# Patient Record
Sex: Female | Born: 1960
Health system: Southern US, Community
[De-identification: ages and names within clinical notes are randomized; demographics above are authoritative.]

## PROBLEM LIST (undated history)

## (undated) DIAGNOSIS — I10 Essential (primary) hypertension: Secondary | ICD-10-CM

## (undated) DIAGNOSIS — K56609 Unspecified intestinal obstruction, unspecified as to partial versus complete obstruction: Secondary | ICD-10-CM

## (undated) DIAGNOSIS — E119 Type 2 diabetes mellitus without complications: Secondary | ICD-10-CM

## (undated) DIAGNOSIS — E7800 Pure hypercholesterolemia, unspecified: Secondary | ICD-10-CM

## (undated) DIAGNOSIS — J40 Bronchitis, not specified as acute or chronic: Secondary | ICD-10-CM

## (undated) DIAGNOSIS — I428 Other cardiomyopathies: Secondary | ICD-10-CM

## (undated) DIAGNOSIS — I519 Heart disease, unspecified: Secondary | ICD-10-CM

## (undated) DIAGNOSIS — M1990 Unspecified osteoarthritis, unspecified site: Secondary | ICD-10-CM

## (undated) DIAGNOSIS — Z91199 Patient's noncompliance with other medical treatment and regimen due to unspecified reason: Secondary | ICD-10-CM

## (undated) DIAGNOSIS — Z9119 Patient's noncompliance with other medical treatment and regimen: Secondary | ICD-10-CM

## (undated) DIAGNOSIS — I517 Cardiomegaly: Secondary | ICD-10-CM

## (undated) DIAGNOSIS — K219 Gastro-esophageal reflux disease without esophagitis: Secondary | ICD-10-CM

## (undated) DIAGNOSIS — I509 Heart failure, unspecified: Secondary | ICD-10-CM

## (undated) DIAGNOSIS — R51 Headache: Secondary | ICD-10-CM

## (undated) DIAGNOSIS — F1910 Other psychoactive substance abuse, uncomplicated: Secondary | ICD-10-CM

## (undated) DIAGNOSIS — Z720 Tobacco use: Secondary | ICD-10-CM

## (undated) HISTORY — PX: ABDOMINAL SURGERY: SHX537

## (undated) HISTORY — PX: OTHER SURGICAL HISTORY: SHX169

## (undated) HISTORY — PX: UTERINE FIBROID SURGERY: SHX826

## (undated) HISTORY — PX: ABDOMINAL HYSTERECTOMY: SHX81

---

## 1998-07-08 ENCOUNTER — Inpatient Hospital Stay (HOSPITAL_COMMUNITY): Admission: RE | Admit: 1998-07-08 | Discharge: 1998-07-11 | Payer: Self-pay | Admitting: Obstetrics and Gynecology

## 1998-08-07 ENCOUNTER — Ambulatory Visit (HOSPITAL_COMMUNITY): Admission: RE | Admit: 1998-08-07 | Discharge: 1998-08-07 | Payer: Self-pay | Admitting: Nephrology

## 1998-08-07 ENCOUNTER — Encounter: Payer: Self-pay | Admitting: Nephrology

## 1998-08-08 ENCOUNTER — Ambulatory Visit (HOSPITAL_COMMUNITY): Admission: RE | Admit: 1998-08-08 | Discharge: 1998-08-08 | Payer: Self-pay | Admitting: Nephrology

## 1998-08-08 ENCOUNTER — Encounter: Payer: Self-pay | Admitting: Nephrology

## 1998-10-20 ENCOUNTER — Encounter: Admission: RE | Admit: 1998-10-20 | Discharge: 1999-01-18 | Payer: Self-pay | Admitting: Nephrology

## 1998-11-27 ENCOUNTER — Emergency Department (HOSPITAL_COMMUNITY): Admission: EM | Admit: 1998-11-27 | Discharge: 1998-11-27 | Payer: Self-pay | Admitting: Emergency Medicine

## 1999-03-24 ENCOUNTER — Ambulatory Visit (HOSPITAL_COMMUNITY): Admission: RE | Admit: 1999-03-24 | Discharge: 1999-03-24 | Payer: Self-pay | Admitting: Nephrology

## 1999-03-24 ENCOUNTER — Encounter: Payer: Self-pay | Admitting: Nephrology

## 1999-03-26 ENCOUNTER — Ambulatory Visit (HOSPITAL_COMMUNITY): Admission: RE | Admit: 1999-03-26 | Discharge: 1999-03-26 | Payer: Self-pay | Admitting: Nephrology

## 1999-03-26 ENCOUNTER — Encounter: Payer: Self-pay | Admitting: Nephrology

## 1999-06-01 ENCOUNTER — Ambulatory Visit (HOSPITAL_COMMUNITY): Admission: RE | Admit: 1999-06-01 | Discharge: 1999-06-01 | Payer: Self-pay | Admitting: Nephrology

## 2000-03-14 ENCOUNTER — Emergency Department (HOSPITAL_COMMUNITY): Admission: EM | Admit: 2000-03-14 | Discharge: 2000-03-14 | Payer: Self-pay | Admitting: Emergency Medicine

## 2001-05-22 ENCOUNTER — Other Ambulatory Visit: Admission: RE | Admit: 2001-05-22 | Discharge: 2001-05-22 | Payer: Self-pay | Admitting: Obstetrics and Gynecology

## 2001-10-31 ENCOUNTER — Encounter: Admission: RE | Admit: 2001-10-31 | Discharge: 2001-10-31 | Payer: Self-pay | Admitting: Obstetrics & Gynecology

## 2001-10-31 ENCOUNTER — Encounter: Payer: Self-pay | Admitting: Obstetrics & Gynecology

## 2002-03-05 ENCOUNTER — Emergency Department (HOSPITAL_COMMUNITY): Admission: EM | Admit: 2002-03-05 | Discharge: 2002-03-05 | Payer: Self-pay | Admitting: Emergency Medicine

## 2002-03-05 ENCOUNTER — Encounter: Payer: Self-pay | Admitting: Emergency Medicine

## 2002-10-16 ENCOUNTER — Encounter: Payer: Self-pay | Admitting: Obstetrics and Gynecology

## 2002-10-16 ENCOUNTER — Encounter: Admission: RE | Admit: 2002-10-16 | Discharge: 2002-10-16 | Payer: Self-pay | Admitting: Obstetrics and Gynecology

## 2003-06-27 ENCOUNTER — Encounter: Payer: Self-pay | Admitting: Nephrology

## 2003-06-27 ENCOUNTER — Encounter: Admission: RE | Admit: 2003-06-27 | Discharge: 2003-06-27 | Payer: Self-pay | Admitting: Nephrology

## 2003-07-23 ENCOUNTER — Encounter: Admission: RE | Admit: 2003-07-23 | Discharge: 2003-09-02 | Payer: Self-pay | Admitting: Nephrology

## 2004-05-19 ENCOUNTER — Ambulatory Visit (HOSPITAL_COMMUNITY): Admission: RE | Admit: 2004-05-19 | Discharge: 2004-05-19 | Payer: Self-pay | Admitting: Obstetrics and Gynecology

## 2007-02-13 ENCOUNTER — Encounter: Admission: RE | Admit: 2007-02-13 | Discharge: 2007-02-13 | Payer: Self-pay | Admitting: Nephrology

## 2007-02-17 ENCOUNTER — Encounter: Admission: RE | Admit: 2007-02-17 | Discharge: 2007-02-17 | Payer: Self-pay | Admitting: Family Medicine

## 2007-07-06 ENCOUNTER — Emergency Department (HOSPITAL_COMMUNITY): Admission: EM | Admit: 2007-07-06 | Discharge: 2007-07-06 | Payer: Self-pay | Admitting: Emergency Medicine

## 2007-07-12 ENCOUNTER — Encounter: Admission: RE | Admit: 2007-07-12 | Discharge: 2007-07-12 | Payer: Self-pay | Admitting: Nephrology

## 2008-12-23 ENCOUNTER — Emergency Department (HOSPITAL_COMMUNITY): Admission: EM | Admit: 2008-12-23 | Discharge: 2008-12-23 | Payer: Self-pay | Admitting: Emergency Medicine

## 2009-04-21 ENCOUNTER — Inpatient Hospital Stay (HOSPITAL_COMMUNITY): Admission: RE | Admit: 2009-04-21 | Discharge: 2009-04-28 | Payer: Self-pay | Admitting: Nephrology

## 2009-04-23 ENCOUNTER — Encounter: Payer: Self-pay | Admitting: Cardiology

## 2009-04-24 ENCOUNTER — Encounter (INDEPENDENT_AMBULATORY_CARE_PROVIDER_SITE_OTHER): Payer: Self-pay | Admitting: Cardiology

## 2009-06-30 ENCOUNTER — Ambulatory Visit (HOSPITAL_COMMUNITY): Admission: RE | Admit: 2009-06-30 | Discharge: 2009-06-30 | Payer: Self-pay | Admitting: Nephrology

## 2009-07-01 ENCOUNTER — Ambulatory Visit: Admission: RE | Admit: 2009-07-01 | Discharge: 2009-07-01 | Payer: Self-pay | Admitting: Otolaryngology

## 2009-07-14 ENCOUNTER — Encounter: Admission: RE | Admit: 2009-07-14 | Discharge: 2009-07-14 | Payer: Self-pay | Admitting: Infectious Diseases

## 2009-10-07 ENCOUNTER — Ambulatory Visit (HOSPITAL_COMMUNITY): Admission: RE | Admit: 2009-10-07 | Discharge: 2009-10-07 | Payer: Self-pay | Admitting: Cardiology

## 2010-01-14 ENCOUNTER — Emergency Department (HOSPITAL_COMMUNITY): Admission: EM | Admit: 2010-01-14 | Discharge: 2010-01-14 | Payer: Self-pay | Admitting: Emergency Medicine

## 2010-04-17 ENCOUNTER — Emergency Department (HOSPITAL_COMMUNITY): Admission: EM | Admit: 2010-04-17 | Discharge: 2010-04-17 | Payer: Self-pay | Admitting: Emergency Medicine

## 2010-04-17 ENCOUNTER — Emergency Department (HOSPITAL_COMMUNITY): Admission: EM | Admit: 2010-04-17 | Discharge: 2010-04-17 | Payer: Self-pay | Admitting: Family Medicine

## 2010-05-31 ENCOUNTER — Emergency Department (HOSPITAL_COMMUNITY): Admission: EM | Admit: 2010-05-31 | Discharge: 2010-05-31 | Payer: Self-pay | Admitting: Emergency Medicine

## 2010-06-03 ENCOUNTER — Ambulatory Visit (HOSPITAL_COMMUNITY): Admission: RE | Admit: 2010-06-03 | Discharge: 2010-06-03 | Payer: Self-pay | Admitting: Nephrology

## 2010-06-23 ENCOUNTER — Ambulatory Visit: Payer: Self-pay | Admitting: Family Medicine

## 2010-06-23 LAB — CONVERTED CEMR LAB
ALT: 9 units/L (ref 0–35)
AST: 14 units/L (ref 0–37)
Alkaline Phosphatase: 66 units/L (ref 39–117)
Chloride: 105 meq/L (ref 96–112)
Creatinine, Ser: 0.67 mg/dL (ref 0.40–1.20)
LDL Cholesterol: 113 mg/dL — ABNORMAL HIGH (ref 0–99)
Pro B Natriuretic peptide (BNP): 79.7 pg/mL (ref 0.0–100.0)
TSH: 0.424 microintl units/mL (ref 0.350–4.500)
Total Bilirubin: 0.3 mg/dL (ref 0.3–1.2)
Total CHOL/HDL Ratio: 3.6
VLDL: 65 mg/dL — ABNORMAL HIGH (ref 0–40)

## 2010-07-15 ENCOUNTER — Encounter: Admission: RE | Admit: 2010-07-15 | Discharge: 2010-07-15 | Payer: Self-pay | Admitting: Nephrology

## 2010-08-17 ENCOUNTER — Emergency Department (HOSPITAL_COMMUNITY): Admission: EM | Admit: 2010-08-17 | Discharge: 2010-08-17 | Payer: Self-pay | Admitting: Emergency Medicine

## 2010-09-03 ENCOUNTER — Emergency Department (HOSPITAL_COMMUNITY)
Admission: EM | Admit: 2010-09-03 | Discharge: 2010-09-03 | Payer: Self-pay | Source: Home / Self Care | Admitting: Emergency Medicine

## 2010-09-04 ENCOUNTER — Emergency Department (HOSPITAL_COMMUNITY)
Admission: EM | Admit: 2010-09-04 | Discharge: 2010-09-04 | Payer: Self-pay | Source: Home / Self Care | Admitting: Emergency Medicine

## 2010-09-07 ENCOUNTER — Encounter (INDEPENDENT_AMBULATORY_CARE_PROVIDER_SITE_OTHER): Payer: Self-pay | Admitting: Family Medicine

## 2010-09-07 LAB — CONVERTED CEMR LAB
ALT: 8 units/L (ref 0–35)
AST: 14 units/L (ref 0–37)
Albumin: 4 g/dL (ref 3.5–5.2)
Alkaline Phosphatase: 66 units/L (ref 39–117)
Basophils Absolute: 0 10*3/uL (ref 0.0–0.1)
Basophils Relative: 1 % (ref 0–1)
Calcium: 9.6 mg/dL (ref 8.4–10.5)
Chloride: 104 meq/L (ref 96–112)
Creatinine, Ser: 0.8 mg/dL (ref 0.40–1.20)
Eosinophils Absolute: 0.1 10*3/uL (ref 0.0–0.7)
LDL Cholesterol: 128 mg/dL — ABNORMAL HIGH (ref 0–99)
MCHC: 32.7 g/dL (ref 30.0–36.0)
MCV: 87.8 fL (ref 78.0–100.0)
Neutro Abs: 3.5 10*3/uL (ref 1.7–7.7)
Neutrophils Relative %: 54 % (ref 43–77)
Potassium: 3.7 meq/L (ref 3.5–5.3)
RDW: 13.1 % (ref 11.5–15.5)
Total CHOL/HDL Ratio: 4

## 2010-11-15 ENCOUNTER — Inpatient Hospital Stay (HOSPITAL_COMMUNITY)
Admission: EM | Admit: 2010-11-15 | Discharge: 2010-11-19 | Payer: Self-pay | Source: Home / Self Care | Attending: Internal Medicine | Admitting: Internal Medicine

## 2010-11-16 ENCOUNTER — Encounter (INDEPENDENT_AMBULATORY_CARE_PROVIDER_SITE_OTHER): Payer: Self-pay | Admitting: Internal Medicine

## 2010-11-16 LAB — CBC
HCT: 40.1 % (ref 36.0–46.0)
Hemoglobin: 13.4 g/dL (ref 12.0–15.0)
MCH: 28.9 pg (ref 26.0–34.0)
MCHC: 33.4 g/dL (ref 30.0–36.0)
MCV: 86.4 fL (ref 78.0–100.0)
Platelets: 262 10*3/uL (ref 150–400)
RBC: 4.64 MIL/uL (ref 3.87–5.11)
RDW: 13.1 % (ref 11.5–15.5)
WBC: 7.4 10*3/uL (ref 4.0–10.5)

## 2010-11-16 LAB — RAPID URINE DRUG SCREEN, HOSP PERFORMED
Amphetamines: NOT DETECTED
Barbiturates: NOT DETECTED
Benzodiazepines: NOT DETECTED
Cocaine: NOT DETECTED
Opiates: NOT DETECTED
Tetrahydrocannabinol: NOT DETECTED

## 2010-11-16 LAB — BASIC METABOLIC PANEL
BUN: 16 mg/dL (ref 6–23)
CO2: 20 mEq/L (ref 19–32)
Calcium: 9.7 mg/dL (ref 8.4–10.5)
Chloride: 112 mEq/L (ref 96–112)
Creatinine, Ser: 0.87 mg/dL (ref 0.4–1.2)
GFR calc Af Amer: >60 mL/min (ref >60)
GFR calc non Af Amer: >60 mL/min (ref >60)
Glucose, Bld: 134 mg/dL — ABNORMAL HIGH (ref 70–99)
Potassium: 4.5 mEq/L (ref 3.5–5.1)
Sodium: 142 mEq/L (ref 135–145)

## 2010-11-16 LAB — DIFFERENTIAL
Basophils Absolute: 0 10*3/uL (ref 0.0–0.1)
Basophils Relative: 0 % (ref 0–1)
Eosinophils Absolute: 0.1 10*3/uL (ref 0.0–0.7)
Eosinophils Relative: 1 % (ref 0–5)
Lymphocytes Relative: 32 % (ref 12–46)
Lymphs Abs: 2.4 10*3/uL (ref 0.7–4.0)
Monocytes Absolute: 0.3 10*3/uL (ref 0.1–1.0)
Monocytes Relative: 4 % (ref 3–12)
Neutro Abs: 4.7 10*3/uL (ref 1.7–7.7)
Neutrophils Relative %: 63 % (ref 43–77)

## 2010-11-16 LAB — BRAIN NATRIURETIC PEPTIDE: Pro B Natriuretic peptide (BNP): 407 pg/mL — ABNORMAL HIGH (ref 0.0–100.0)

## 2010-11-16 LAB — CK TOTAL AND CKMB (NOT AT ARMC)
CK, MB: 2.5 ng/mL (ref 0.3–4.0)
Relative Index: INVALID (ref 0.0–2.5)
Total CK: 93 U/L (ref 7–177)

## 2010-11-16 LAB — CARDIAC PANEL(CRET KIN+CKTOT+MB+TROPI)
CK, MB: 2.2 ng/mL (ref 0.3–4.0)
CK, MB: 1.9 ng/mL (ref 0.3–4.0)
Relative Index: INVALID (ref 0.0–2.5)
Relative Index: INVALID (ref 0.0–2.5)
Total CK: 62 U/L (ref 7–177)
Total CK: 64 U/L (ref 7–177)
Troponin I: 0.02 ng/mL (ref 0.00–0.06)
Troponin I: 0.02 ng/mL (ref 0.00–0.06)

## 2010-11-16 LAB — TROPONIN I: Troponin I: 0.03 ng/mL (ref 0.00–0.06)

## 2010-11-16 LAB — D-DIMER, QUANTITATIVE: D-Dimer, Quant: 0.84 ug/mL-FEU — ABNORMAL HIGH (ref 0.00–0.48)

## 2010-11-18 ENCOUNTER — Ambulatory Visit (HOSPITAL_COMMUNITY)
Admission: RE | Admit: 2010-11-18 | Discharge: 2010-11-18 | Payer: Self-pay | Source: Home / Self Care | Attending: Cardiology | Admitting: Cardiology

## 2010-11-18 LAB — COMPREHENSIVE METABOLIC PANEL
ALT: 12 U/L (ref 0–35)
AST: 17 U/L (ref 0–37)
Albumin: 3.6 g/dL (ref 3.5–5.2)
Alkaline Phosphatase: 59 U/L (ref 39–117)
BUN: 18 mg/dL (ref 6–23)
CO2: 25 mEq/L (ref 19–32)
Calcium: 9.8 mg/dL (ref 8.4–10.5)
Chloride: 102 mEq/L (ref 96–112)
Creatinine, Ser: 0.87 mg/dL (ref 0.4–1.2)
GFR calc Af Amer: >60 mL/min (ref >60)
GFR calc non Af Amer: >60 mL/min (ref >60)
Glucose, Bld: 119 mg/dL — ABNORMAL HIGH (ref 70–99)
Potassium: 3.9 mEq/L (ref 3.5–5.1)
Sodium: 138 mEq/L (ref 135–145)
Total Bilirubin: 0.6 mg/dL (ref 0.3–1.2)
Total Protein: 7.1 g/dL (ref 6.0–8.3)

## 2010-11-18 LAB — CARDIAC PANEL(CRET KIN+CKTOT+MB+TROPI)
Relative Index: INVALID (ref 0.0–2.5)
Relative Index: INVALID (ref 0.0–2.5)
Relative Index: INVALID (ref 0.0–2.5)
Relative Index: INVALID (ref 0.0–2.5)
Relative Index: INVALID (ref 0.0–2.5)

## 2010-11-18 LAB — BASIC METABOLIC PANEL
BUN: 19 mg/dL (ref 6–23)
CO2: 26 mEq/L (ref 19–32)
Calcium: 9.6 mg/dL (ref 8.4–10.5)
Chloride: 103 mEq/L (ref 96–112)
Creatinine, Ser: 0.79 mg/dL (ref 0.4–1.2)
GFR calc Af Amer: >60 mL/min (ref >60)
GFR calc non Af Amer: >60 mL/min (ref >60)
Glucose, Bld: 130 mg/dL — ABNORMAL HIGH (ref 70–99)
Potassium: 3.6 mEq/L (ref 3.5–5.1)
Sodium: 138 mEq/L (ref 135–145)

## 2010-11-18 LAB — CBC
HCT: 37.1 % (ref 36.0–46.0)
Hemoglobin: 12.4 g/dL (ref 12.0–15.0)
MCH: 28.6 pg (ref 26.0–34.0)
MCHC: 33.4 g/dL (ref 30.0–36.0)
MCV: 85.5 fL (ref 78.0–100.0)
Platelets: 278 10*3/uL (ref 150–400)
RBC: 4.34 MIL/uL (ref 3.87–5.11)
RDW: 12.9 % (ref 11.5–15.5)
WBC: 11.1 10*3/uL — ABNORMAL HIGH (ref 4.0–10.5)

## 2010-11-18 LAB — BRAIN NATRIURETIC PEPTIDE: Pro B Natriuretic peptide (BNP): 502 pg/mL — ABNORMAL HIGH (ref 0.0–100.0)

## 2010-11-18 LAB — MAGNESIUM: Magnesium: 2.1 mg/dL (ref 1.5–2.5)

## 2010-11-23 LAB — CBC
MCV: 86.7 fL (ref 78.0–100.0)
Platelets: 287 10*3/uL (ref 150–400)
RDW: 12.9 % (ref 11.5–15.5)
WBC: 7.9 10*3/uL (ref 4.0–10.5)

## 2010-11-23 LAB — BASIC METABOLIC PANEL
BUN: 15 mg/dL (ref 6–23)
Calcium: 9.8 mg/dL (ref 8.4–10.5)
Creatinine, Ser: 0.87 mg/dL (ref 0.4–1.2)
GFR calc non Af Amer: >60 mL/min (ref >60)
Potassium: 4.2 mEq/L (ref 3.5–5.1)

## 2010-11-23 LAB — BRAIN NATRIURETIC PEPTIDE: Pro B Natriuretic peptide (BNP): 338 pg/mL — ABNORMAL HIGH (ref 0.0–100.0)

## 2010-12-02 NOTE — Discharge Summary (Signed)
NAMEMARRIAN, BELLS              ACCOUNT NO.:  [id number]  MEDICAL RECORD NO.:  [id number]          PATIENT TYPE:  INP  LOCATION:  1425                         FACILITY:  [facility]  PHYSICIAN:  Conley Canal, MD      DATE OF BIRTH:  1960-11-27  DATE OF ADMISSION:  11/15/2010 DATE OF DISCHARGE:  11/19/2010                        DISCHARGE SUMMARY - REFERRING   PRIMARY CARE PHYSICIAN:  [facility].  CARDIOLOGIST:  Osvaldo Shipper. Spruill, M.D..  CONSULTING PHYSICIAN:  Thurmon Fair, MD, Cardiology.  DISCHARGE DIAGNOSES: 1. Acute decompensation of severe systolic congestive heart failure.     Ejection fraction 15% to 20% on this admission. 2. Nonischemic dilated cardiomyopathy status post Myoview this     admission, showing global hypokinesis with no reversible ischemia     or infarction. 3. Essential hypertension. 4. Hyperlipidemia. 5. History of polysubstance abuse. 6. Tobacco habituation. 7. Migraine headaches. 8. Hyperlipidemia. 9. Chronic bronchitis.  DISCHARGE MEDICATIONS: 1. Aspirin 325 mg daily. 2. Coreg 12.5 mg twice daily. 3. Folic acid 1 mg daily. 4. Lasix 40 mg daily. 5. Lisinopril 5 mg twice daily. 6. Multivitamins 1 tablet daily. 7. Spironolactone 25 mg daily. 8. Thiamine 100 mg daily. 9. Benadryl 50 mg q.h.s. as needed. 10.Cyclobenzaprine 10 mg q.h.s. p.r.n. 11.Loratadine 10 mg daily. 12.Pravastatin 40 mg daily. 13.Ventolin 1-2 puffs inhalations every 6 hours as needed.  PROCEDURES PERFORMED: 1. Chest x-ray January 15 showed mild CHF with cardiomegaly and mild     diffuse interstitial pulmonary edema, small right pleural effusion. 2. CT angiogram of the chest on January 15 showed no evidence of     pulmonary embolism, but some cardiomegaly with left ventricular     enlargement and left ventricular hypertrophy as well as moderate     LAD coronary artery calcification for age, focal ground-glass     opacity in the right upper lobe consistent with an  inflammatory or     infectious etiology, as well as mild bronchiectasis in the right     lower lobe. 3. Myoview on January 18 showed no reversible ischemia or infarction     and global hypokinesia and left ventricular dilatation with left     ventricular ejection fraction of 20%.  HOSPITAL COURSE:  Ms. Tolsma is a pleasant 50 year old female with history of nonischemic cardiomyopathy, ejection fraction 20% on this admission, who came in with worsening shortness of breath.  She was found to be in acute decompensation of congestive heart failure with a B- type natriuretic peptide of 407 on admission, D-dimer slightly elevated at 0.84.  Urine drug screen was negative.  Cardiac enzymes were also negative.  Patient was, hence, started on diuretics continued on beta- blockers, angiotensin-converting enzyme inhibitors, spironolactone and seen by Cardiology, who eventually performed a stress test.  Cardiology recommended followup, 2D echocardiogram in 2-3 weeks.  And if no improvement in ejection fraction, she would require an automatic implantable cardioverter defibrillator.  Patient is to follow with her cardiologist.  She says that she has insurance issues at present, but I encouraged her to sort this out, as she will probably need an automatic implantable cardioverter defibrillator.  I had extensive discussions  on her need for that, her compliance to limit salt intake to 2 g daily, fluid intake to 1500 cc daily, and to check her weight at least 3 times a week and take the recordings to her primary care doctor and cardiologist for further adjustment of her Lasix.  The patient should follow with [facility] in the next 1 week.  Today, her labs are significant for normal CBC.  Electrolytes normal with BUN of 15, creatinine of 0.87, BNP 338.  She is discharged in stable condition.  The time spent for this discharge preparation is less than 30 minutes.     Conley Canal,  MD     SR/MEDQ  D:  11/19/2010  T:  11/19/2010  Job:  [id number]  cc:   Osvaldo Shipper. Spruill, M.D. Fax: [phone]  Melvern Banker Fax: [phone]  Electronically Signed by Conley Canal  on 12/02/2010 08:37:23 AM

## 2010-12-11 ENCOUNTER — Encounter (INDEPENDENT_AMBULATORY_CARE_PROVIDER_SITE_OTHER): Payer: Self-pay | Admitting: Family Medicine

## 2010-12-11 LAB — CONVERTED CEMR LAB: Microalb, Ur: 0.5 mg/dL (ref 0.00–1.89)

## 2010-12-17 ENCOUNTER — Ambulatory Visit (HOSPITAL_COMMUNITY)
Admission: RE | Admit: 2010-12-17 | Discharge: 2010-12-17 | Disposition: A | Discharge status: Home / Self Care | Payer: Medicaid Other | Source: Ambulatory Visit | Attending: Family Medicine | Admitting: Family Medicine

## 2010-12-17 DIAGNOSIS — I059 Rheumatic mitral valve disease, unspecified: Secondary | ICD-10-CM | POA: Insufficient documentation

## 2010-12-17 DIAGNOSIS — I10 Essential (primary) hypertension: Secondary | ICD-10-CM | POA: Insufficient documentation

## 2010-12-17 DIAGNOSIS — R072 Precordial pain: Secondary | ICD-10-CM | POA: Insufficient documentation

## 2010-12-17 DIAGNOSIS — F17200 Nicotine dependence, unspecified, uncomplicated: Secondary | ICD-10-CM | POA: Insufficient documentation

## 2010-12-17 DIAGNOSIS — I079 Rheumatic tricuspid valve disease, unspecified: Secondary | ICD-10-CM | POA: Insufficient documentation

## 2010-12-17 DIAGNOSIS — I379 Nonrheumatic pulmonary valve disorder, unspecified: Secondary | ICD-10-CM | POA: Insufficient documentation

## 2011-01-13 LAB — BASIC METABOLIC PANEL
BUN: 9 mg/dL (ref 6–23)
Calcium: 9.5 mg/dL (ref 8.4–10.5)
Chloride: 108 mEq/L (ref 96–112)
Creatinine, Ser: 0.64 mg/dL (ref 0.4–1.2)
GFR calc Af Amer: >60 mL/min (ref >60)
GFR calc non Af Amer: >60 mL/min (ref >60)

## 2011-01-13 LAB — CBC
MCV: 87 fL (ref 78.0–100.0)
Platelets: 227 10*3/uL (ref 150–400)
RBC: 4.41 MIL/uL (ref 3.87–5.11)
RDW: 13 % (ref 11.5–15.5)
WBC: 8.4 10*3/uL (ref 4.0–10.5)

## 2011-01-13 LAB — DIFFERENTIAL
Basophils Absolute: 0 10*3/uL (ref 0.0–0.1)
Lymphocytes Relative: 19 % (ref 12–46)
Lymphs Abs: 1.6 10*3/uL (ref 0.7–4.0)
Neutro Abs: 6.5 10*3/uL (ref 1.7–7.7)
Neutrophils Relative %: 78 % — ABNORMAL HIGH (ref 43–77)

## 2011-01-17 LAB — POCT I-STAT, CHEM 8
BUN: 8 mg/dL (ref 6–23)
Chloride: 103 mEq/L (ref 96–112)
Creatinine, Ser: 0.8 mg/dL (ref 0.4–1.2)
Potassium: 3 mEq/L — ABNORMAL LOW (ref 3.5–5.1)
Sodium: 140 mEq/L (ref 135–145)

## 2011-01-17 LAB — CBC
MCV: 88.3 fL (ref 78.0–100.0)
RBC: 4.23 MIL/uL (ref 3.87–5.11)
WBC: 10.2 10*3/uL (ref 4.0–10.5)

## 2011-02-06 LAB — BLOOD GAS, ARTERIAL
Bicarbonate: 26.6 mEq/L — ABNORMAL HIGH (ref 20.0–24.0)
TCO2: 24.3 mmol/L (ref 0–100)
pCO2 arterial: 38.2 mmHg (ref 35.0–45.0)
pH, Arterial: 7.457 — ABNORMAL HIGH (ref 7.350–7.400)
pO2, Arterial: 91 mmHg (ref 80.0–100.0)

## 2011-02-08 LAB — DIFFERENTIAL
Basophils Absolute: 0 10*3/uL (ref 0.0–0.1)
Basophils Absolute: 0 10*3/uL (ref 0.0–0.1)
Basophils Absolute: 0 10*3/uL (ref 0.0–0.1)
Basophils Absolute: 0.1 10*3/uL (ref 0.0–0.1)
Basophils Relative: 1 % (ref 0–1)
Basophils Relative: 1 % (ref 0–1)
Eosinophils Absolute: 0 10*3/uL (ref 0.0–0.7)
Eosinophils Absolute: 0 10*3/uL (ref 0.0–0.7)
Eosinophils Relative: 0 % (ref 0–5)
Eosinophils Relative: 0 % (ref 0–5)
Eosinophils Relative: 0 % (ref 0–5)
Eosinophils Relative: 0 % (ref 0–5)
Eosinophils Relative: 0 % (ref 0–5)
Lymphocytes Relative: 11 % — ABNORMAL LOW (ref 12–46)
Lymphocytes Relative: 25 % (ref 12–46)
Lymphocytes Relative: 30 % (ref 12–46)
Lymphs Abs: 1 10*3/uL (ref 0.7–4.0)
Lymphs Abs: 2.3 10*3/uL (ref 0.7–4.0)
Monocytes Absolute: 0.2 10*3/uL (ref 0.1–1.0)
Monocytes Absolute: 0.3 10*3/uL (ref 0.1–1.0)
Monocytes Absolute: 0.3 10*3/uL (ref 0.1–1.0)
Monocytes Absolute: 0.4 10*3/uL (ref 0.1–1.0)
Monocytes Relative: 3 % (ref 3–12)
Neutro Abs: 3.8 10*3/uL (ref 1.7–7.7)
Neutro Abs: 7.5 10*3/uL (ref 1.7–7.7)
Neutrophils Relative %: 86 % — ABNORMAL HIGH (ref 43–77)

## 2011-02-08 LAB — BRAIN NATRIURETIC PEPTIDE: Pro B Natriuretic peptide (BNP): 129 pg/mL — ABNORMAL HIGH (ref 0.0–100.0)

## 2011-02-08 LAB — CBC
HCT: 36.1 % (ref 36.0–46.0)
HCT: 36.2 % (ref 36.0–46.0)
HCT: 36.5 % (ref 36.0–46.0)
HCT: 37 % (ref 36.0–46.0)
Hemoglobin: 12 g/dL (ref 12.0–15.0)
Hemoglobin: 12.1 g/dL (ref 12.0–15.0)
Hemoglobin: 12.2 g/dL (ref 12.0–15.0)
Hemoglobin: 12.8 g/dL (ref 12.0–15.0)
MCHC: 33.1 g/dL (ref 30.0–36.0)
MCHC: 33.2 g/dL (ref 30.0–36.0)
MCHC: 33.6 g/dL (ref 30.0–36.0)
MCV: 87.3 fL (ref 78.0–100.0)
MCV: 87.8 fL (ref 78.0–100.0)
MCV: 87.9 fL (ref 78.0–100.0)
Platelets: 242 10*3/uL (ref 150–400)
Platelets: 256 10*3/uL (ref 150–400)
Platelets: 260 10*3/uL (ref 150–400)
RBC: 4.21 MIL/uL (ref 3.87–5.11)
RDW: 12.6 % (ref 11.5–15.5)
RDW: 12.8 % (ref 11.5–15.5)
RDW: 13 % (ref 11.5–15.5)
WBC: 7.5 10*3/uL (ref 4.0–10.5)
WBC: 7.9 10*3/uL (ref 4.0–10.5)
WBC: 8.7 10*3/uL (ref 4.0–10.5)

## 2011-02-08 LAB — RENAL FUNCTION PANEL
Albumin: 3.6 g/dL (ref 3.5–5.2)
Albumin: 3.8 g/dL (ref 3.5–5.2)
BUN: 13 mg/dL (ref 6–23)
BUN: 19 mg/dL (ref 6–23)
BUN: 20 mg/dL (ref 6–23)
CO2: 25 mEq/L (ref 19–32)
CO2: 29 mEq/L (ref 19–32)
Calcium: 9.2 mg/dL (ref 8.4–10.5)
Calcium: 9.6 mg/dL (ref 8.4–10.5)
Calcium: 9.8 mg/dL (ref 8.4–10.5)
Chloride: 101 mEq/L (ref 96–112)
Chloride: 104 mEq/L (ref 96–112)
Creatinine, Ser: 0.69 mg/dL (ref 0.4–1.2)
Creatinine, Ser: 0.82 mg/dL (ref 0.4–1.2)
Creatinine, Ser: 0.82 mg/dL (ref 0.4–1.2)
Glucose, Bld: 105 mg/dL — ABNORMAL HIGH (ref 70–99)
Phosphorus: 4.6 mg/dL (ref 2.3–4.6)
Potassium: 4.1 mEq/L (ref 3.5–5.1)
Sodium: 138 mEq/L (ref 135–145)

## 2011-02-08 LAB — COMPREHENSIVE METABOLIC PANEL
AST: 24 U/L (ref 0–37)
AST: 30 U/L (ref 0–37)
Albumin: 3.8 g/dL (ref 3.5–5.2)
Alkaline Phosphatase: 61 U/L (ref 39–117)
BUN: 16 mg/dL (ref 6–23)
CO2: 25 mEq/L (ref 19–32)
Calcium: 9.6 mg/dL (ref 8.4–10.5)
Chloride: 103 mEq/L (ref 96–112)
Chloride: 109 mEq/L (ref 96–112)
Creatinine, Ser: 0.86 mg/dL (ref 0.4–1.2)
Creatinine, Ser: 1.03 mg/dL (ref 0.4–1.2)
GFR calc Af Amer: >60 mL/min (ref >60)
GFR calc Af Amer: >60 mL/min (ref >60)
GFR calc non Af Amer: 57 mL/min — ABNORMAL LOW (ref >60)
Glucose, Bld: 178 mg/dL — ABNORMAL HIGH (ref 70–99)
Potassium: 3.4 mEq/L — ABNORMAL LOW (ref 3.5–5.1)
Total Bilirubin: 0.5 mg/dL (ref 0.3–1.2)
Total Bilirubin: 0.6 mg/dL (ref 0.3–1.2)
Total Protein: 6.6 g/dL (ref 6.0–8.3)

## 2011-02-08 LAB — CK TOTAL AND CKMB (NOT AT ARMC)
CK, MB: 2.1 ng/mL (ref 0.3–4.0)
Relative Index: 1.9 (ref 0.0–2.5)

## 2011-02-08 LAB — URINALYSIS, ROUTINE W REFLEX MICROSCOPIC
Glucose, UA: NEGATIVE mg/dL
Hgb urine dipstick: NEGATIVE
Ketones, ur: NEGATIVE mg/dL
Protein, ur: NEGATIVE mg/dL
pH: 6 (ref 5.0–8.0)

## 2011-02-08 LAB — LIPID PANEL
Cholesterol: 206 mg/dL — ABNORMAL HIGH (ref 0–200)
HDL: 55 mg/dL (ref >39)
LDL Cholesterol: 129 mg/dL — ABNORMAL HIGH (ref 0–99)
Total CHOL/HDL Ratio: 3.7 RATIO

## 2011-03-16 NOTE — Consult Note (Signed)
[name], [name]              ACCOUNT NO.:  [id number]   MEDICAL RECORD NO.:  [id number]          PATIENT TYPE:  INP   LOCATION:  1422                         FACILITY:  [facility]   PHYSICIAN:  Osvaldo Shipper. Spruill, M.D.DATE OF BIRTH:  1961/03/13   DATE OF CONSULTATION:  04/22/2009  DATE OF DISCHARGE:                                 CONSULTATION   REFERRING PHYSICIAN:  Dr. Jeri Cos   TYPE OF CONSULTATION:  Cardiology.   BRIEF HISTORY AND REASON FOR CONSULTATION:  This 50 year old black woman  was apparently admitted to the hospital through the emergency room with  complaints of shortness of breath that had begun approximately 2 weeks  ago.  The patient states that the shortness of breath had worsened with  time, and also she developed other symptoms including inability to lie  flat in bed and shortness of breath on exertion.  The patient also  complained of inability to sleep due to nocturia and a strangling  sensation with a cough that would occur almost nightly.  She stated that  walking up steps was also extremely difficult, and she eventually came  to the hospital for further evaluation.  The patient has a long history  of hypertension but freely admits to noncompliance.   The patient also states that she had been a patient of Dr. Jeri Cos but had not seen him in a few years and also freely admits to  not taking her medications that had been prescribed.  She had been on  Lotensin in the past.   The patient also reports that she has been a drug abuser but has been  free of crack cocaine use for the past 5 years, denies any current crack  cocaine use, denies any problems with her heart.   There is no history of rheumatic fever.  The patient has no history of  Strep throat, no previous cardiac history and denies any severe episodes  of chest pain.  She further denies any exertional chest discomfort, no  diaphoresis or palpitations appreciated.   When the patient  came to the emergency room, she was evaluated and found  to have an elevated BNP as well as a chest x-ray that was consistent  with congestive heart failure.   PAST MEDICAL HISTORY:  1. Previous hysterectomy.  2. Previous bowel surgery for scar tissue.   FAMILY HISTORY:  Significant for myocardial infarction in her father and  mother has diabetes.  The patient's father has expired due to myocardial  infarction.   REVIEW OF SYSTEMS:  NEUROLOGIC:  No syncope, no headaches or blurred  vision.  GASTROINTESTINAL:  No nausea or vomiting, no blood in her  stools.  GENITOURINARY:  No blood in her urine.  CARDIOVASCULAR:  No  previous episodes of chest discomfort, no palpitations.  MUSCULOSKELETAL:  No complaints.  ENDOCRINE:  No complaints.  INTEGUMENT:  No complaints.  Remainder of review of systems essentially  noncontributory.   MEDICATIONS AT ADMISSION:  1. Proventil 2 puffs as needed.  2. Robitussin Cough, Cold and Flu.   PHYSICAL EXAMINATION:  VITAL SIGNS:  Temperature 98.4,  pulse 103,  respirations 22, blood pressure 133/93, saturation 95%.  GENERAL:  A well-developed black woman examined in moderate distress.  HEENT:  Examination reveals that the patient's left eye deviates to the  left.  NECK:  No jugular venous distention, no carotid bruit appreciated, no  palpable thyromegaly.  THORAX:  No chest wall tenderness elicited.  LUNGS:  Rales at both bases with a few scattered rhonchi.  CARDIOVASCULAR:  S1, S2 normal.  The patient does have an S3 gallop.  No  rubs, lifts or thrills appreciated.  No heaves noted.  ABDOMEN:  Soft, normoactive bowel sounds, nontender, no detectable  hepatomegaly.  GENITOURINARY AND PELVIC:  Deferred.  EXTREMITIES:  No cyanosis, clubbing or edema.   LABORATORY:  The patient's CBC on April 21, 2009:  White blood cell count  7500, hemoglobin 12.2, hematocrit 36.5, Beta natriuretic peptide 590;  this was recorded on April 21, 2009.  The CMET revealed  mild hypokalemia  with potassium of 3.4, glucose 14, creatinine 0.8, BUN 14.  Troponin-I  was 0.04, unremarkable, and a CK-MB was 2.1 with a total CK of 111.   RADIOLOGIC STUDIES:  Chest x-ray revealed heart was moderately enlarged,  vascular congestion and interstitial edema had developed.  The patient  had a small right pleural effusion as well.   Electrocardiogram revealed sinus tachycardia, ST-T wave abnormality,  consider anterior lateral ischemia.   ASSESSMENT:  1. Decompensated acute systolic heart failure.  2. History of crack cocaine use.  3. History of uncontrolled hypertension.   PLAN:  At the present time, the patient does appear quite stable on her  current medical regimen.  Will adjust her medication as needed and will  also obtain additional diagnostic studies to evaluate her ejection  fraction and evaluate for coronary disease with a stress test.  This  will be necessary due to her previous history of use of crack cocaine.      Osvaldo Shipper. Spruill, M.D.  Electronically Signed     JOS/MEDQ  D:  04/22/2009  T:  04/22/2009  Job:  [id number]   cc:   Jarome Matin, M.D.  Fax: [phone]

## 2011-03-19 NOTE — Discharge Summary (Signed)
[name], GUITRON              ACCOUNT NO.:  [id number]   MEDICAL RECORD NO.:  [id number]          PATIENT TYPE:  INP   LOCATION:  1422                         FACILITY:  [facility]   PHYSICIAN:  Jarome Matin, M.D.DATE OF BIRTH:  December 21, 1960   DATE OF ADMISSION:  04/21/2009  DATE OF DISCHARGE:  04/28/2009                               DISCHARGE SUMMARY   ADMISSION DIAGNOSIS:  Shortness of breath and wheezing for the past  several days.   DISCHARGE DIAGNOSES:  1. Congestive heart failure.  2. Systolic heart failure.  3. History of drug abuse.  4. History of uncontrolled hypertension.   BRIEF HISTORY/HOSPITAL COURSE:  This is an admission for this 50-year-  old [place] American female.  She has a history of hypertension.  She  has been having shortness of breath and wheezing for the past few days.  There has also been wheezing and it seems to be getting worse.  She came  to the emergency room and was found to have some congestive heart  failure.  She also has an enlarged heart and increased blood pressure.  She had not been seen in the office for over 2 years.  She was on  Amlodipine and Maxzide, but had not taken any in the past year.  The  patient lives with her mother, Sahana Boyland.  She has also been on  Lotrel 10/20 daily, but she has not been taking it.  She was also on  Estradiol, she has not been taking that either.  The patient states that  she could not afford her medication.   FAMILY HISTORY:  She has 4 brothers and 2 sisters.  Mother is living and  she lives with her.  Father is deceased.  He died from a heart attack.   PAST MEDICAL HISTORY:  The patient had a hysterectomy about 10 years  ago.  She has never had children.   PHYSICAL EXAMINATION:  VITAL SIGNS:  Temperature 98.2, pulse 96,  respirations 16, blood pressure 139/103, 95% on 2 liters.  ABDOMEN:  Soft.  Active bowel sounds.  EXTREMITIES:  She could move all of her extremities.  No edema.   She has  congestive heart failure.  BNP of 590.  The patient was put on  bedrest and a low-salt diet.  Restarted blood pressure medicines.  Asked  Cardiology to see her.  Dr. Shana Chute saw the patient.  She had a 2-D  echo.  She was found to have an ejection fraction of around 20 which is  relatively low.  She has left ventricular enlargement.  The patient was  put on medications.  She was put on Bystolic 10 mg daily.  Norvasc 10 mg  daily.  Low-salt diet.  Lipitor 10 mg daily.  Tussionex to help with her  cough.  The patient gradually started feeling better.  We put her on  MiraLax to help with her bowel movements.   MEDICATIONS:  1. Laxative, MiraLax 17 gm daily as needed.  We spoke with the      financial counselor so she could have good disability and Medicaid.  Should get her drugs for her medical problems.  She is going to      take the MiraLax home.  Financial counselor is going to help her so      she can get her medications.  2. She is on Coreg 6.25 mg twice a day.  3. BiDil 1 pill t.i.d.  4. Altace 2.5 b.i.d.  5. Lasix 40 mg daily.  6. Diazepam 2 mg b.i.d., helps with her headache and labyrinthitis.  7. Ventolin inhaler.   DISPOSITION:  The patient is discharged home.   FOLLOW UP:  1. I will see her in about 2 weeks.  2. She is to follow up with the Cardiologist in about 2-3 weeks.           ______________________________  Jarome Matin, M.D.     CEF/MEDQ  D:  06/05/2009  T:  06/05/2009  Job:  [id number]

## 2011-10-28 ENCOUNTER — Encounter: Payer: Self-pay | Admitting: *Deleted

## 2011-10-28 ENCOUNTER — Emergency Department (HOSPITAL_COMMUNITY)
Admission: EM | Admit: 2011-10-28 | Discharge: 2011-10-28 | Discharge status: Against Medical Advice | Payer: Self-pay | Attending: Emergency Medicine | Admitting: Emergency Medicine

## 2011-10-28 DIAGNOSIS — R079 Chest pain, unspecified: Secondary | ICD-10-CM | POA: Insufficient documentation

## 2011-10-28 DIAGNOSIS — R0602 Shortness of breath: Secondary | ICD-10-CM | POA: Insufficient documentation

## 2011-10-28 DIAGNOSIS — M549 Dorsalgia, unspecified: Secondary | ICD-10-CM | POA: Insufficient documentation

## 2011-10-28 DIAGNOSIS — R110 Nausea: Secondary | ICD-10-CM | POA: Insufficient documentation

## 2011-10-28 DIAGNOSIS — M542 Cervicalgia: Secondary | ICD-10-CM | POA: Insufficient documentation

## 2011-10-28 HISTORY — DX: Bronchitis, not specified as acute or chronic: J40

## 2011-10-28 HISTORY — DX: Heart failure, unspecified: I50.9

## 2011-10-28 HISTORY — DX: Essential (primary) hypertension: I10

## 2011-10-28 NOTE — ED Notes (Signed)
Pt reports shob and back, neck pain and pain under L breast x3-4 days. Sts she has been diagnosed with "severe respiratory heart failure" Sts R ankle has been swelling denies other edema. Pt also reports some nausea and intermittent central dull achy chest pain between breasts.

## 2011-10-28 NOTE — ED Notes (Signed)
Pt has decided to leave post triage, states she cannot wait any longer. Apologized for wait, AMA paper signed.

## 2011-11-23 ENCOUNTER — Other Ambulatory Visit: Payer: Self-pay

## 2011-11-23 ENCOUNTER — Emergency Department (HOSPITAL_COMMUNITY): Payer: Medicaid Other

## 2011-11-23 ENCOUNTER — Inpatient Hospital Stay (HOSPITAL_COMMUNITY)
Admission: EM | Admit: 2011-11-23 | Discharge: 2011-11-25 | DRG: 918 | Disposition: A | Discharge status: Home / Self Care | Payer: Medicaid Other | Attending: Family Medicine | Admitting: Family Medicine

## 2011-11-23 ENCOUNTER — Encounter (HOSPITAL_COMMUNITY): Payer: Self-pay

## 2011-11-23 DIAGNOSIS — I200 Unstable angina: Secondary | ICD-10-CM | POA: Diagnosis present

## 2011-11-23 DIAGNOSIS — Z720 Tobacco use: Secondary | ICD-10-CM | POA: Diagnosis present

## 2011-11-23 DIAGNOSIS — F17200 Nicotine dependence, unspecified, uncomplicated: Secondary | ICD-10-CM | POA: Diagnosis present

## 2011-11-23 DIAGNOSIS — I5022 Chronic systolic (congestive) heart failure: Secondary | ICD-10-CM | POA: Diagnosis present

## 2011-11-23 DIAGNOSIS — I509 Heart failure, unspecified: Secondary | ICD-10-CM | POA: Diagnosis present

## 2011-11-23 DIAGNOSIS — Z91199 Patient's noncompliance with other medical treatment and regimen due to unspecified reason: Secondary | ICD-10-CM

## 2011-11-23 DIAGNOSIS — I10 Essential (primary) hypertension: Secondary | ICD-10-CM | POA: Diagnosis present

## 2011-11-23 DIAGNOSIS — R000 Tachycardia, unspecified: Secondary | ICD-10-CM | POA: Diagnosis present

## 2011-11-23 DIAGNOSIS — J690 Pneumonitis due to inhalation of food and vomit: Secondary | ICD-10-CM | POA: Diagnosis present

## 2011-11-23 DIAGNOSIS — E785 Hyperlipidemia, unspecified: Secondary | ICD-10-CM | POA: Diagnosis present

## 2011-11-23 DIAGNOSIS — J209 Acute bronchitis, unspecified: Secondary | ICD-10-CM | POA: Diagnosis present

## 2011-11-23 DIAGNOSIS — T405X1A Poisoning by cocaine, accidental (unintentional), initial encounter: Principal | ICD-10-CM | POA: Diagnosis present

## 2011-11-23 DIAGNOSIS — R071 Chest pain on breathing: Secondary | ICD-10-CM | POA: Diagnosis present

## 2011-11-23 DIAGNOSIS — I428 Other cardiomyopathies: Secondary | ICD-10-CM | POA: Diagnosis present

## 2011-11-23 DIAGNOSIS — I249 Acute ischemic heart disease, unspecified: Secondary | ICD-10-CM

## 2011-11-23 DIAGNOSIS — F1910 Other psychoactive substance abuse, uncomplicated: Secondary | ICD-10-CM | POA: Diagnosis present

## 2011-11-23 DIAGNOSIS — F1410 Cocaine abuse, uncomplicated: Secondary | ICD-10-CM | POA: Diagnosis present

## 2011-11-23 DIAGNOSIS — I429 Cardiomyopathy, unspecified: Secondary | ICD-10-CM | POA: Diagnosis present

## 2011-11-23 DIAGNOSIS — I519 Heart disease, unspecified: Secondary | ICD-10-CM

## 2011-11-23 DIAGNOSIS — Z9119 Patient's noncompliance with other medical treatment and regimen: Secondary | ICD-10-CM

## 2011-11-23 DIAGNOSIS — R0789 Other chest pain: Secondary | ICD-10-CM | POA: Diagnosis present

## 2011-11-23 HISTORY — DX: Other psychoactive substance abuse, uncomplicated: F19.10

## 2011-11-23 HISTORY — DX: Tobacco use: Z72.0

## 2011-11-23 HISTORY — DX: Heart disease, unspecified: I51.9

## 2011-11-23 HISTORY — DX: Cardiomegaly: I51.7

## 2011-11-23 HISTORY — DX: Other cardiomyopathies: I42.8

## 2011-11-23 HISTORY — DX: Patient's noncompliance with other medical treatment and regimen: Z91.19

## 2011-11-23 HISTORY — DX: Patient's noncompliance with other medical treatment and regimen due to unspecified reason: Z91.199

## 2011-11-23 LAB — CBC
MCV: 87 fL (ref 78.0–100.0)
Platelets: 224 10*3/uL (ref 150–400)
RBC: 4.14 MIL/uL (ref 3.87–5.11)
WBC: 11.9 10*3/uL — ABNORMAL HIGH (ref 4.0–10.5)

## 2011-11-23 LAB — CARDIAC PANEL(CRET KIN+CKTOT+MB+TROPI)
Relative Index: INVALID (ref 0.0–2.5)
Total CK: 44 U/L (ref 7–177)

## 2011-11-23 LAB — HEPATIC FUNCTION PANEL
ALT: 39 U/L — ABNORMAL HIGH (ref 0–35)
Alkaline Phosphatase: 82 U/L (ref 39–117)
Bilirubin, Direct: 0.1 mg/dL (ref 0.0–0.3)
Indirect Bilirubin: 0.5 mg/dL (ref 0.3–0.9)

## 2011-11-23 LAB — DIFFERENTIAL
Lymphocytes Relative: 10 % — ABNORMAL LOW (ref 12–46)
Lymphs Abs: 1.2 10*3/uL (ref 0.7–4.0)
Neutrophils Relative %: 84 % — ABNORMAL HIGH (ref 43–77)

## 2011-11-23 LAB — BASIC METABOLIC PANEL
CO2: 23 mEq/L (ref 19–32)
Glucose, Bld: 161 mg/dL — ABNORMAL HIGH (ref 70–99)
Potassium: 4.6 mEq/L (ref 3.5–5.1)
Sodium: 135 mEq/L (ref 135–145)

## 2011-11-23 LAB — PROTIME-INR
INR: 1.05 (ref 0.00–1.49)
Prothrombin Time: 13.9 seconds (ref 11.6–15.2)

## 2011-11-23 LAB — TROPONIN I: Troponin I: <0.3 ng/mL (ref <0.30)

## 2011-11-23 LAB — APTT: aPTT: 39 seconds — ABNORMAL HIGH (ref 24–37)

## 2011-11-23 MED ORDER — AZITHROMYCIN 250 MG PO TABS
250.0000 mg | ORAL_TABLET | Freq: Every day | ORAL | Status: DC
  Administered 2011-11-24 – 2011-11-25 (×2): 250 mg via ORAL
  Filled 2011-11-23 (×3): qty 1

## 2011-11-23 MED ORDER — ONDANSETRON HCL 4 MG/2ML IJ SOLN
4.0000 mg | Freq: Three times a day (TID) | INTRAMUSCULAR | Status: DC | PRN
  Administered 2011-11-23: 4 mg via INTRAVENOUS
  Filled 2011-11-23: qty 2

## 2011-11-23 MED ORDER — ACETAMINOPHEN 325 MG PO TABS
650.0000 mg | ORAL_TABLET | Freq: Four times a day (QID) | ORAL | Status: DC | PRN
  Administered 2011-11-24 (×3): 650 mg via ORAL
  Filled 2011-11-23 (×2): qty 2
  Filled 2011-11-23: qty 1

## 2011-11-23 MED ORDER — ASPIRIN EC 325 MG PO TBEC
325.0000 mg | DELAYED_RELEASE_TABLET | Freq: Every day | ORAL | Status: DC
  Administered 2011-11-24 – 2011-11-25 (×3): 325 mg via ORAL
  Filled 2011-11-23 (×3): qty 1

## 2011-11-23 MED ORDER — ALUM & MAG HYDROXIDE-SIMETH 200-200-20 MG/5ML PO SUSP: 30.0000 mL | Freq: Four times a day (QID) | ORAL | Status: DC | PRN

## 2011-11-23 MED ORDER — DOCUSATE SODIUM 100 MG PO CAPS
100.0000 mg | ORAL_CAPSULE | Freq: Two times a day (BID) | ORAL | Status: DC
  Administered 2011-11-24 (×3): 100 mg via ORAL
  Filled 2011-11-23 (×5): qty 1

## 2011-11-23 MED ORDER — HEPARIN SOD (PORCINE) IN D5W 100 UNIT/ML IV SOLN
1150.0000 [IU]/h | INTRAVENOUS | Status: DC
  Administered 2011-11-23: 800 [IU]/h via INTRAVENOUS
  Filled 2011-11-23 (×2): qty 250

## 2011-11-23 MED ORDER — LEVALBUTEROL HCL 0.63 MG/3ML IN NEBU
0.6300 mg | INHALATION_SOLUTION | Freq: Four times a day (QID) | RESPIRATORY_TRACT | Status: DC
  Administered 2011-11-23 – 2011-11-25 (×8): 0.63 mg via RESPIRATORY_TRACT
  Filled 2011-11-23 (×11): qty 3

## 2011-11-23 MED ORDER — ONDANSETRON HCL 4 MG PO TABS: 4.0000 mg | ORAL_TABLET | Freq: Four times a day (QID) | ORAL | Status: DC | PRN

## 2011-11-23 MED ORDER — IOHEXOL 300 MG/ML  SOLN
100.0000 mL | Freq: Once | INTRAMUSCULAR | Status: AC | PRN
  Administered 2011-11-23: 100 mL via INTRAVENOUS

## 2011-11-23 MED ORDER — HYDROMORPHONE HCL PF 1 MG/ML IJ SOLN
1.0000 mg | INTRAMUSCULAR | Status: DC | PRN
  Administered 2011-11-23: 1 mg via INTRAVENOUS
  Filled 2011-11-23: qty 1

## 2011-11-23 MED ORDER — AZITHROMYCIN 500 MG PO TABS: 500.0000 mg | ORAL_TABLET | Freq: Every day | ORAL | Status: AC

## 2011-11-23 MED ORDER — NITROGLYCERIN 0.1 MG/HR TD PT24
0.1000 mg | MEDICATED_PATCH | Freq: Every day | TRANSDERMAL | Status: DC
  Administered 2011-11-24 (×2): 0.1 mg via TRANSDERMAL
  Filled 2011-11-23 (×4): qty 1

## 2011-11-23 MED ORDER — HYDROMORPHONE HCL PF 2 MG/ML IJ SOLN
2.0000 mg | Freq: Once | INTRAMUSCULAR | Status: AC
  Administered 2011-11-23: 2 mg via INTRAVENOUS
  Filled 2011-11-23: qty 1

## 2011-11-23 MED ORDER — ADULT MULTIVITAMIN W/MINERALS CH
1.0000 | ORAL_TABLET | Freq: Every day | ORAL | Status: DC
  Administered 2011-11-24 – 2011-11-25 (×3): 1 via ORAL
  Filled 2011-11-23 (×3): qty 1

## 2011-11-23 MED ORDER — DILTIAZEM HCL 30 MG PO TABS
30.0000 mg | ORAL_TABLET | Freq: Three times a day (TID) | ORAL | Status: DC
  Administered 2011-11-24 (×3): 30 mg via ORAL
  Filled 2011-11-23 (×6): qty 1

## 2011-11-23 MED ORDER — PIPERACILLIN-TAZOBACTAM 3.375 G IVPB 30 MIN
3.3750 g | Freq: Three times a day (TID) | INTRAVENOUS | Status: DC
  Administered 2011-11-24 (×2): 3.375 g via INTRAVENOUS
  Filled 2011-11-23 (×6): qty 50

## 2011-11-23 MED ORDER — ACETAMINOPHEN 650 MG RE SUPP: 650.0000 mg | Freq: Four times a day (QID) | RECTAL | Status: DC | PRN

## 2011-11-23 MED ORDER — ONDANSETRON HCL 4 MG/2ML IJ SOLN
4.0000 mg | Freq: Once | INTRAMUSCULAR | Status: AC
  Administered 2011-11-23: 4 mg via INTRAVENOUS
  Filled 2011-11-23: qty 2

## 2011-11-23 MED ORDER — SODIUM CHLORIDE 0.9 % IV SOLN: INTRAVENOUS | Status: DC

## 2011-11-23 MED ORDER — MORPHINE SULFATE 4 MG/ML IJ SOLN: 4.0000 mg | INTRAMUSCULAR | Status: DC | PRN

## 2011-11-23 MED ORDER — LORATADINE 10 MG PO TABS
10.0000 mg | ORAL_TABLET | Freq: Every day | ORAL | Status: DC
  Administered 2011-11-24 – 2011-11-25 (×3): 10 mg via ORAL
  Filled 2011-11-23 (×3): qty 1

## 2011-11-23 MED ORDER — FUROSEMIDE 10 MG/ML IJ SOLN
60.0000 mg | Freq: Two times a day (BID) | INTRAMUSCULAR | Status: DC
  Administered 2011-11-24: 60 mg via INTRAVENOUS
  Filled 2011-11-23 (×3): qty 6

## 2011-11-23 MED ORDER — HYDROCODONE-ACETAMINOPHEN 5-325 MG PO TABS
2.0000 | ORAL_TABLET | Freq: Once | ORAL | Status: AC
  Administered 2011-11-23: 2 via ORAL
  Filled 2011-11-23: qty 2

## 2011-11-23 MED ORDER — GUAIFENESIN ER 600 MG PO TB12
1200.0000 mg | ORAL_TABLET | Freq: Two times a day (BID) | ORAL | Status: DC
  Administered 2011-11-24 – 2011-11-25 (×3): 1200 mg via ORAL
  Filled 2011-11-23 (×6): qty 2

## 2011-11-23 MED ORDER — HEPARIN BOLUS VIA INFUSION
4000.0000 [IU] | Freq: Once | INTRAVENOUS | Status: AC
  Administered 2011-11-23: 4000 [IU] via INTRAVENOUS
  Filled 2011-11-23: qty 4000

## 2011-11-23 MED ORDER — VITAMIN B-1 100 MG PO TABS
100.0000 mg | ORAL_TABLET | Freq: Every day | ORAL | Status: DC
  Administered 2011-11-24 – 2011-11-25 (×3): 100 mg via ORAL
  Filled 2011-11-23 (×3): qty 1

## 2011-11-23 MED ORDER — SPIRONOLACTONE 25 MG PO TABS
25.0000 mg | ORAL_TABLET | Freq: Every day | ORAL | Status: DC
  Administered 2011-11-24 – 2011-11-25 (×3): 25 mg via ORAL
  Filled 2011-11-23 (×3): qty 1

## 2011-11-23 MED ORDER — FOLIC ACID 1 MG PO TABS
1.0000 mg | ORAL_TABLET | Freq: Every day | ORAL | Status: DC
  Administered 2011-11-24 – 2011-11-25 (×3): 1 mg via ORAL
  Filled 2011-11-23 (×3): qty 1

## 2011-11-23 MED ORDER — OXYCODONE HCL 5 MG PO TABS: 5.0000 mg | ORAL_TABLET | ORAL | Status: DC | PRN

## 2011-11-23 MED ORDER — INFLUENZA VIRUS VACC SPLIT PF IM SUSP
0.5000 mL | INTRAMUSCULAR | Status: AC
  Administered 2011-11-24: 0.5 mL via INTRAMUSCULAR
  Filled 2011-11-23: qty 0.5

## 2011-11-23 MED ORDER — CYCLOBENZAPRINE HCL 10 MG PO TABS
10.0000 mg | ORAL_TABLET | Freq: Every day | ORAL | Status: DC
  Administered 2011-11-24 (×2): 10 mg via ORAL
  Filled 2011-11-23 (×3): qty 1

## 2011-11-23 MED ORDER — MORPHINE SULFATE 4 MG/ML IJ SOLN
6.0000 mg | Freq: Once | INTRAMUSCULAR | Status: AC
  Administered 2011-11-23: 6 mg via INTRAVENOUS
  Filled 2011-11-23: qty 2

## 2011-11-23 MED ORDER — ONDANSETRON HCL 4 MG/2ML IJ SOLN: 4.0000 mg | Freq: Four times a day (QID) | INTRAMUSCULAR | Status: DC | PRN

## 2011-11-23 NOTE — ED Notes (Signed)
Patient transported via stretcher to CT

## 2011-11-23 NOTE — ED Notes (Signed)
Patient transported via stretcher to X-ray 

## 2011-11-23 NOTE — ED Notes (Signed)
Pt returned from xray to ED7.

## 2011-11-23 NOTE — ED Provider Notes (Signed)
17:44 Dr Venetia Constable accepts for admission to step down triad team 5  Ward Givens, MD 11/23/11 1745

## 2011-11-23 NOTE — ED Notes (Signed)
Pt returned from CT to ER7.

## 2011-11-23 NOTE — ED Notes (Signed)
Called into room by pt, pt c/o nausea following morphine. Dr. Shela Commons notified. Verbal order for Zofran 4mg  IV x 1 dose now.

## 2011-11-23 NOTE — ED Notes (Signed)
Patient here with sharp chestpain that she reports started yesterday, worse with cough and movement. Reports that cough is productive, hyperventilating on arrival

## 2011-11-23 NOTE — H&P (Signed)
PCP:   No primary provider on file.   Chief Complaint:  Chest pain for 2 days.Marland Kitchen  HPI: [name] is a 51 year old female with severe cardiomyopathy, ?nonischemic EF 20% Feb 2012, active cocaine user and smoker, hypertensive, who last used cocaine 2 days or so ago, who presented to the ED with worsening left sided chest pain, which is pulling in nature with a pleuritic element, is severe and sometimes radiates to the left arm, that started yesterday but is worsening. She has had a cough productive of yellow colored phlegm, since yesterday. She states that she vomited some yellow clear colored material several times yesterday, but states that the chest pain started before the vomiting. She admitted to some chills, which she says she experienced during the vomiting. Since yesterday, she had worsening orthopnea and SOBE on exertion. She admits to not taking her meds as prescribed, as she is trying to stretch them to save some money. The patient states that she has had cardiac work up done in [place] but she is not sure about the results. In ED a tc angiogram showed some congestion, no pe. A point of care troponin was negative. EKG showed diffuse t wave inversions, with regular tachycardia(around 120's). Her wbc was slightly elevated. She still has some chest pain and complaints of headache.  Review of Systems:  Unremarkable except as highlighted in the HPI.  Past Medical History: Past Medical History  Diagnosis Date  . Bronchitis   . CHF (congestive heart failure)   . Hypertension    Past Surgical History  Procedure Date  . Abdominal hysterectomy   . Abdominal surgery   . Uterine fibroid surgery     Medications: Prior to Admission medications   Medication Sig Start Date End Date Taking? Authorizing Provider  aspirin 81 MG EC tablet Take 81 mg by mouth daily. Swallow whole.   Yes Historical Provider, MD  carvedilol (COREG) 12.5 MG tablet Take 12.5 mg by mouth 2 (two) times daily with a meal.    Yes Historical Provider, MD  cyclobenzaprine (FLEXERIL) 10 MG tablet Take 10 mg by mouth at bedtime.   Yes Historical Provider, MD  folic acid (FOLVITE) 1 MG tablet Take 1 mg by mouth daily.   Yes Historical Provider, MD  furosemide (LASIX) 40 MG tablet Take 40 mg by mouth daily.   Yes Historical Provider, MD  lisinopril (PRINIVIL,ZESTRIL) 5 MG tablet Take 5 mg by mouth daily.   Yes Historical Provider, MD  loratadine (CLARITIN) 10 MG tablet Take 10 mg by mouth daily.   Yes Historical Provider, MD  Multiple Vitamin (MULITIVITAMIN WITH MINERALS) TABS Take 1 tablet by mouth daily.   Yes Historical Provider, MD  pravastatin (PRAVACHOL) 40 MG tablet Take 40 mg by mouth daily.   Yes Historical Provider, MD  spironolactone (ALDACTONE) 25 MG tablet Take 25 mg by mouth daily.   Yes Historical Provider, MD  thiamine 100 MG tablet Take 100 mg by mouth daily.   Yes Historical Provider, MD    Allergies:   Allergies  Allergen Reactions  . Codeine Nausea And Vomiting    Social History:  reports that she has been smoking.  She has never used smokeless tobacco. She reports that she drinks alcohol. She reports that she uses illicit drugs (Marijuana), cocaine. Lives alone.  Family History: Father died of heart disease in his 29's.  Physical Exam: Filed Vitals:   11/23/11 1038 11/23/11 1523 11/23/11 1833 11/23/11 1929  BP: 135/84 136/99 138/72 147/101  Pulse:  108  111  Temp:   98.1 F (36.7 C) 98.9 F (37.2 C)  TempSrc:   Oral Oral  Resp: 21 18 22 22   SpO2: 99% 100% 98% 98%   In respiratory distress HEENT- no jvd. No carotid bruits. RS- b/l air entry. Scant scattered rhonchi. CVS- S1S2. Regular tachycardia. No mumrmurs   Labs on Admission:   [facility] 11/23/11 1312  NA 135  K 4.6  CL 101  CO2 23  GLUCOSE 161*  BUN 12  CREATININE 0.71  CALCIUM 9.3  MG --  PHOS --   No results found for this basename: AST:2,ALT:2,ALKPHOS:2,BILITOT:2,PROT:2,ALBUMIN:2 in the last 72 hours No  results found for this basename: LIPASE:2,AMYLASE:2 in the last 72 hours  Basename 11/23/11 1312  WBC 11.9*  NEUTROABS 10.0*  HGB 12.0  HCT 36.0  MCV 87.0  PLT 224    Basename 11/23/11 1035  CKTOTAL --  CKMB --  CKMBINDEX --  TROPONINI <0.30   No results found for this basename: TSH,T4TOTAL,FREET3,T3FREE,THYROIDAB in the last 72 hours No results found for this basename: VITAMINB12:2,FOLATE:2,FERRITIN:2,TIBC:2,IRON:2,RETICCTPCT:2 in the last 72 hours  Radiological Exams on Admission: Dg Chest 2 View  11/23/2011  *RADIOLOGY REPORT*  Clinical Data: Cough, chest pain, smoking history  CHEST - 2 VIEW  Comparison: Chest x-ray of 11/15/2010  Findings: The lungs are not as well aerated with some basilar volume loss present. There is some more opacity at the left lung base and developing pneumonia cannot be excluded.  Mild cardiomegaly is stable. No bony abnormality is seen.  IMPRESSION: Stable cardiomegaly.  More opacity at the left lung base.  Cannot exclude developing pneumonia.  Original Report Authenticated By: Juline Patch, M.D.   Ct Angio Chest W/cm &/or Wo Cm  11/23/2011  *RADIOLOGY REPORT*  Clinical Data: Pleuritic chest pain.  Chest tightness and shortness of breath.  Symptoms became worse overnight.  Blood tinged cough.  CT ANGIOGRAPHY CHEST  Technique:  Multidetector CT imaging of the chest using the standard protocol during bolus administration of intravenous contrast. Multiplanar reconstructed images including MIPs were obtained and reviewed to evaluate the vascular anatomy.  Contrast: OMNIPAQUE IOHEXOL 300 MG/ML IV SOLN  Comparison: 01/15/2012and 11/23/2011  Findings: Pulmonary arteries are well opacified.  There is no evidence for acute pulmonary embolus.  The heart is enlarged. There are dependent changes consistent with pulmonary edema.  No focal consolidations or pleural effusions are identified.  Images of the upper abdomen are unremarkable.  Mild thoracic degenerative  changes are present.  IMPRESSION:  1.  Technically adequate exam showing no evidence for acute pulmonary embolus. 2.  Cardiomegaly and mild changes of pulmonary edema.  Original Report Authenticated By: Patterson Hammersmith, M.D.    Assessment 51 year old active cocaine user with cardiomyopathy, EF 20%, presenting with clinical picture highly concerning for acute coronary syndrome. She may have flush pulmonary edema(acute on chronic systolic chf) and /or possible aspiration pneumonitis from vomiting. This is likely cocaine induced. She has diffuse EKG changes but unfortunately does not have a complete cardiac panel. Plan .ACS (acute coronary syndrome)- admit sdu. ROMI protocol with serial cardiac enzymes/EJKG/2decho. Meanwhile start nitro paste/asa/calcium channel blocker to slow heart rate( no better blocker due to coke use), O2, morphine as needed/heparin iv. Consult cards if pain doesn't improve or if she rules in. Check lipids panel/tsh. .CHF, acute- check bnp, lasix/spironolactone, daily weight, I/O monitoring. Cardiology consult for ?aicd consideration- may not be a fantastic candidate given noncompliance with meds and active  substance abuse. .Cocaine abuse- counselling given. .Tobacco abuse- counseling givenn. Nicotine patch. .Pneumonitis, aspiration .HTN (hypertension), malignant- as above- calcium channel blocker. Aspiration pneumonitis vs cap- zosyn/zithromax, respiratory culture. Condition critical, time spent- 65 minutes.     [name] [phone] 11/23/2011, 8:58 PM

## 2011-11-23 NOTE — ED Provider Notes (Signed)
History     CSN: [id number]  Arrival date & time 11/23/11  7829   First MD Initiated Contact with Patient 11/23/11 [phone]      No chief complaint on file.  chief complaint: Chest pain  (Consider location/radiation/quality/duration/timing/severity/associated sxs/prior treatment) HPI Patient complains of productive cough for one week accompanied by pleuritic chest pain left-sided anterior worse with deep breathing and worse with moving her left arm. Chest pain began yesterday afternoon. Patient denies fever. No treatment prior to coming here. Also admits to sore throat with pain on swallowing no other associated symptoms.. pain is moderate to severe, constant Past Medical History  Diagnosis Date  . Bronchitis   . CHF (congestive heart failure)   . Hypertension    Cardiomyopathy with ejection fraction 20 %; polysubstance abuse Past Surgical History  Procedure Date  . Abdominal hysterectomy   . Abdominal surgery   . Uterine fibroid surgery     No family history on file.  History  Substance Use Topics  . Smoking status: Current Everyday Smoker  . Smokeless tobacco: Not on file  . Alcohol Use: Yes     occasionally   Marijuana use  OB History    Grav Para Term Preterm Abortions TAB SAB Ect Mult Living                  Review of Systems  HENT: Positive for sore throat.   Respiratory: Positive for cough.   Cardiovascular: Positive for chest pain.  All other systems reviewed and are negative.    Allergies  Codeine  Home Medications  No current outpatient prescriptions on file.  BP 137/93  Pulse 109  Temp 97.7 F (36.5 C)  Resp 24  SpO2 100%  Physical Exam  Nursing note reviewed. Constitutional: She appears well-developed and well-nourished. She appears distressed.       Anxious appearing  HENT:  Head: Normocephalic and atraumatic.  Eyes: Conjunctivae are normal. Pupils are equal, round, and reactive to light.  Neck: Neck supple. No tracheal deviation  present. No thyromegaly present.  Cardiovascular: Normal rate, regular rhythm and normal heart sounds.   No murmur heard. Pulmonary/Chest: Effort normal and breath sounds normal. She exhibits tenderness.       Left chest wall exquisitely tender pain is reproduced by forcible abduction of left shoulder  Abdominal: Soft. Bowel sounds are normal. She exhibits no distension. There is no tenderness.  Musculoskeletal: Normal range of motion. She exhibits no edema and no tenderness.  Neurological: She is alert. Coordination normal.  Skin: Skin is warm and dry. No rash noted.  Psychiatric:       Anxious    ED Course  Procedures (including critical care time) 11 AM feels somewhat improved after treatment with intravenous dilaudid. Patient received no relief after treatment with hydrocodone-apap.  Labs Reviewed - No data to display No results found.   Date: 11/23/2011  Rate: 105  Rhythm: sinus tachycardia  QRS Axis: normal  Intervals: normal  ST/T Wave abnormalities: Inverted T waves laterally, nonspecific ST-T wave changes  Conduction Disutrbances:none  Narrative Interpretation:   Old EKG Reviewed: unchanged No change from 11/15/2010 Results for orders placed during the hospital encounter of 11/23/11  TROPONIN I      Component Value Range   Troponin I <0.30  <0.30 (ng/mL)  D-DIMER, QUANTITATIVE      Component Value Range   D-Dimer, Quant 1.50 (*) 0.00 - 0.48 (ug/mL-FEU)  CBC      Component Value Range  WBC 11.9 (*) 4.0 - 10.5 (K/uL)   RBC 4.14  3.87 - 5.11 (MIL/uL)   Hemoglobin 12.0  12.0 - 15.0 (g/dL)   HCT 16.1  09.6 - 04.5 (%)   MCV 87.0  78.0 - 100.0 (fL)   MCH 29.0  26.0 - 34.0 (pg)   MCHC 33.3  30.0 - 36.0 (g/dL)   RDW 40.9  81.1 - 91.4 (%)   Platelets 224  150 - 400 (K/uL)  DIFFERENTIAL      Component Value Range   Neutrophils Relative 84 (*) 43 - 77 (%)   Neutro Abs 10.0 (*) 1.7 - 7.7 (K/uL)   Lymphocytes Relative 10 (*) 12 - 46 (%)   Lymphs Abs 1.2  0.7 - 4.0  (K/uL)   Monocytes Relative 5  3 - 12 (%)   Monocytes Absolute 0.6  0.1 - 1.0 (K/uL)   Eosinophils Relative 0  0 - 5 (%)   Eosinophils Absolute 0.0  0.0 - 0.7 (K/uL)   Basophils Relative 0  0 - 1 (%)   Basophils Absolute 0.0  0.0 - 0.1 (K/uL)  BASIC METABOLIC PANEL      Component Value Range   Sodium 135  135 - 145 (mEq/L)   Potassium 4.6  3.5 - 5.1 (mEq/L)   Chloride 101  96 - 112 (mEq/L)   CO2 23  19 - 32 (mEq/L)   Glucose, Bld 161 (*) 70 - 99 (mg/dL)   BUN 12  6 - 23 (mg/dL)   Creatinine, Ser 7.82  0.50 - 1.10 (mg/dL)   Calcium 9.3  8.4 - 95.6 (mg/dL)   GFR calc non Af Amer >90  >90 (mL/min)   GFR calc Af Amer >90  >90 (mL/min)   Dg Chest 2 View  11/23/2011  *RADIOLOGY REPORT*  Clinical Data: Cough, chest pain, smoking history  CHEST - 2 VIEW  Comparison: Chest x-ray of 11/15/2010  Findings: The lungs are not as well aerated with some basilar volume loss present. There is some more opacity at the left lung base and developing pneumonia cannot be excluded.  Mild cardiomegaly is stable. No bony abnormality is seen.  IMPRESSION: Stable cardiomegaly.  More opacity at the left lung base.  Cannot exclude developing pneumonia.  Original Report Authenticated By: Juline Patch, M.D.   Ct Angio Chest W/cm &/or Wo Cm  11/23/2011  *RADIOLOGY REPORT*  Clinical Data: Pleuritic chest pain.  Chest tightness and shortness of breath.  Symptoms became worse overnight.  Blood tinged cough.  CT ANGIOGRAPHY CHEST  Technique:  Multidetector CT imaging of the chest using the standard protocol during bolus administration of intravenous contrast. Multiplanar reconstructed images including MIPs were obtained and reviewed to evaluate the vascular anatomy.  Contrast: OMNIPAQUE IOHEXOL 300 MG/ML IV SOLN  Comparison: 01/15/2012and 11/23/2011  Findings: Pulmonary arteries are well opacified.  There is no evidence for acute pulmonary embolus.  The heart is enlarged. There are dependent changes consistent with  pulmonary edema.  No focal consolidations or pleural effusions are identified.  Images of the upper abdomen are unremarkable.  Mild thoracic degenerative changes are present.  IMPRESSION:  1.  Technically adequate exam showing no evidence for acute pulmonary embolus. 2.  Cardiomegaly and mild changes of pulmonary edema.  Original Report Authenticated By: Patterson Hammersmith, M.D.    No diagnosis found.  4:30 PM patient continues to complain of pleuritic chest pain worse with cough appears in mild to moderate distress . I.e. has severe chest pain when  she coughs, morphine ordered.   525 pm adm,its to cocaine use 2 days ago MDM  Plan to consult [facility] hospitalist md for admission and pain control ,. chf felt to be occult. Pt signed ou to dr I knapp at 525 pm Diagnosis #1Chest pain #2 CHF #3 polysubstance abuse        Doug Sou, MD 11/23/11 1733

## 2011-11-23 NOTE — ED Notes (Signed)
MD at bedside. 

## 2011-11-23 NOTE — Progress Notes (Signed)
ANTICOAGULATION CONSULT NOTE - Initial Consult  Pharmacy Consult for IV Heparin Indication: chest pain/ACS  Allergies  Allergen Reactions  . Codeine Nausea And Vomiting    Patient Measurements: Patient stated height: 64" Patient stated weight: 145 lbs (66 kg)  Vital Signs: Temp: 98.9 F (37.2 C) (01/22 1929) Temp src: Oral (01/22 1929) BP: 147/101 mmHg (01/22 1929) Pulse Rate: 111  (01/22 1929)  Labs:  Basename 11/23/11 1312 11/23/11 1035  HGB 12.0 --  HCT 36.0 --  PLT 224 --  APTT -- --  LABPROT -- --  INR -- --  HEPARINUNFRC -- --  CREATININE 0.71 --  CKTOTAL -- --  CKMB -- --  TROPONINI -- <0.30   CrCl is unknown because there is no height on file for the current visit.  Medical History: Past Medical History  Diagnosis Date  . Bronchitis   . CHF (congestive heart failure)   . Hypertension     Medications:  Scheduled:    . azithromycin  500 mg Oral Daily   Followed by  . azithromycin  250 mg Oral Daily  . diltiazem  30 mg Oral Q8H  . furosemide  60 mg Intravenous BID  . guaiFENesin  1,200 mg Oral BID  . HYDROcodone-acetaminophen  2 tablet Oral Once  .  HYDROmorphone (DILAUDID) injection  2 mg Intravenous Once  . influenza  inactive virus vaccine  0.5 mL Intramuscular Tomorrow-1000  . levalbuterol  0.63 mg Nebulization Q6H  .  morphine injection  6 mg Intravenous Once  . nitroGLYCERIN  0.1 mg Transdermal Daily  . ondansetron (ZOFRAN) IV  4 mg Intravenous Once  . piperacillin-tazobactam  3.375 g Intravenous Q8H   Infusions:    . sodium chloride      Assessment: [name] presenting with chest pain to begin IV heparin for ACS/STEMI. Will use patient-stated height/weight.  SCr 0.71.   Goal of Therapy:  Heparin level 0.3-0.7 units/ml   Plan:  Heparin 4000 units IV bolus x1 then begin infusion at 800 units/hr (8 ml/hr).  Follow up heparin level in 6 hours. Daily CBC and HL.  Clance Boll 11/23/2011,8:39 PM

## 2011-11-24 ENCOUNTER — Other Ambulatory Visit: Payer: Self-pay

## 2011-11-24 ENCOUNTER — Encounter (HOSPITAL_COMMUNITY): Payer: Self-pay | Admitting: Internal Medicine

## 2011-11-24 DIAGNOSIS — I519 Heart disease, unspecified: Secondary | ICD-10-CM

## 2011-11-24 DIAGNOSIS — I059 Rheumatic mitral valve disease, unspecified: Secondary | ICD-10-CM

## 2011-11-24 LAB — CBC
HCT: 36.7 % (ref 36.0–46.0)
MCH: 28.3 pg (ref 26.0–34.0)
MCHC: 32.4 g/dL (ref 30.0–36.0)
MCV: 87.2 fL (ref 78.0–100.0)
RDW: 13.8 % (ref 11.5–15.5)

## 2011-11-24 LAB — COMPREHENSIVE METABOLIC PANEL
ALT: 37 U/L — ABNORMAL HIGH (ref 0–35)
Alkaline Phosphatase: 82 U/L (ref 39–117)
CO2: 28 mEq/L (ref 19–32)
Chloride: 101 mEq/L (ref 96–112)
GFR calc Af Amer: 83 mL/min — ABNORMAL LOW (ref >90)
GFR calc non Af Amer: 71 mL/min — ABNORMAL LOW (ref >90)
Glucose, Bld: 137 mg/dL — ABNORMAL HIGH (ref 70–99)
Potassium: 3.5 mEq/L (ref 3.5–5.1)
Sodium: 137 mEq/L (ref 135–145)

## 2011-11-24 LAB — LIPID PANEL
HDL: 53 mg/dL (ref >39)
Total CHOL/HDL Ratio: 3.8 RATIO
VLDL: 41 mg/dL — ABNORMAL HIGH (ref 0–40)

## 2011-11-24 LAB — EXPECTORATED SPUTUM ASSESSMENT W GRAM STAIN, RFLX TO RESP C

## 2011-11-24 LAB — CARDIAC PANEL(CRET KIN+CKTOT+MB+TROPI)
Relative Index: INVALID (ref 0.0–2.5)
Total CK: 31 U/L (ref 7–177)

## 2011-11-24 LAB — MRSA PCR SCREENING: MRSA by PCR: NEGATIVE

## 2011-11-24 LAB — HEPARIN LEVEL (UNFRACTIONATED): Heparin Unfractionated: 0.26 IU/mL — ABNORMAL LOW (ref 0.30–0.70)

## 2011-11-24 MED ORDER — LISINOPRIL 5 MG PO TABS
5.0000 mg | ORAL_TABLET | Freq: Every day | ORAL | Status: DC
  Administered 2011-11-24 – 2011-11-25 (×2): 5 mg via ORAL
  Filled 2011-11-24 (×2): qty 1

## 2011-11-24 MED ORDER — FUROSEMIDE 40 MG PO TABS
40.0000 mg | ORAL_TABLET | Freq: Every day | ORAL | Status: DC
  Administered 2011-11-25: 40 mg via ORAL
  Filled 2011-11-24: qty 1

## 2011-11-24 MED ORDER — POLYETHYLENE GLYCOL 3350 17 G PO PACK
17.0000 g | PACK | Freq: Every day | ORAL | Status: DC
  Filled 2011-11-24: qty 1

## 2011-11-24 MED ORDER — DIPHENHYDRAMINE HCL 25 MG PO CAPS
25.0000 mg | ORAL_CAPSULE | Freq: Once | ORAL | Status: AC
  Administered 2011-11-25: 25 mg via ORAL
  Filled 2011-11-24: qty 1

## 2011-11-24 MED ORDER — CARVEDILOL 6.25 MG PO TABS
6.2500 mg | ORAL_TABLET | Freq: Two times a day (BID) | ORAL | Status: DC
  Administered 2011-11-24 – 2011-11-25 (×2): 6.25 mg via ORAL
  Filled 2011-11-24 (×3): qty 1

## 2011-11-24 MED ORDER — SODIUM CHLORIDE 0.9 % IV SOLN
INTRAVENOUS | Status: DC
  Administered 2011-11-24: 09:00:00 via INTRAVENOUS

## 2011-11-24 MED ORDER — FUROSEMIDE 40 MG PO TABS
40.0000 mg | ORAL_TABLET | Freq: Every day | ORAL | Status: DC
  Filled 2011-11-24: qty 1

## 2011-11-24 NOTE — Progress Notes (Signed)
  Echocardiogram 2D Echocardiogram has been performed.  Jorje Guild [facility] 11/24/2011, 8:24 AM

## 2011-11-24 NOTE — Progress Notes (Signed)
ANTICOAGULATION CONSULT NOTE - Follow Up Consult  Pharmacy Consult for Heparin Indication: chest pain/ACS  Allergies  Allergen Reactions  . Codeine Nausea And Vomiting    Patient Measurements: Height: 5\' 4"  (162.6 cm) Weight: 150 lb 12.7 oz (68.4 kg) IBW/kg (Calculated) : 54.7  Heparin Dosing Weight:   Vital Signs: Temp: 98.4 F (36.9 C) (01/23 0000) Temp src: Oral (01/23 0000) BP: 102/76 mmHg (01/23 0200) Pulse Rate: 109  (01/23 0200)  Labs:  Basename 11/24/11 0400 11/24/11 0320 11/23/11 2043 11/23/11 1312 11/23/11 1035  HGB -- 11.9* -- 12.0 --  HCT -- 36.7 -- 36.0 --  PLT -- 230 -- 224 --  APTT -- 74* 39* -- --  LABPROT -- 15.2 13.9 -- --  INR -- 1.18 1.05 -- --  HEPARINUNFRC 0.23* -- -- -- --  CREATININE -- 0.91 -- 0.71 --  CKTOTAL -- 45 44 -- --  CKMB -- 2.0 2.0 -- --  TROPONINI -- <0.30 <0.30 -- <0.30   Estimated Creatinine Clearance: 68.7 ml/min (by C-G formula based on Cr of 0.91).   Medications:  Infusions:    . heparin 800 Units/hr (11/23/11 2237)  . DISCONTD: sodium chloride      Assessment: Patient with low heparin level.  Level just under 6hours from drip start.  No drip issues per RN.  Goal of Therapy:  Heparin level 0.3-0.7 units/ml   Plan:  Increase heparin to 1000 units/hr, follow up with 1200 level  Darlina Guys, Jacquenette Shone Crowford 11/24/2011,4:30 AM

## 2011-11-24 NOTE — Progress Notes (Signed)
PROGRESS NOTE  [name]:[id number] DOB: November 24, 1960 DOA: 11/23/2011 PCP: None.  Cardiologist: None.  Brief narrative: 51 year old woman presented with chest pain. History of productive cough for one week with associated pleuritic chest pain, left-sided. Worse with arm movement. Chest pain began one day prior to admission. Also endorsed orthopnea and dyspnea on exertion. Noncompliant with medication. EKG reported to show diffuse T-wave inversion. Started on IV heparin for possible ACS. Referred to the hospitalist service for addition for chest pain, CHF, polysubstance abuse.  Has a history of nonischemic dilated cardiomyopathy and was last admitted to the system in January of 2012. At that time consideration for AICD implantation was given. Was seen as an inpatient by Dr. Sharyn Lull and 2012 but did not followup with him in the outpatient setting. Has not seen Dr. Shana Chute in over 2 years.   Past medical history: Active cocaine use, nonischemic dilated cardiomyopathy with left ventricular ejection fraction 15-20%, hypertension, hyperlipidemia, cigarette smoker  Consultants:  Cardiology  Procedures:  11/16/2010: 2-D echocardiogram: Left ventricular ejection fraction 15-20%. Severe diffuse hypokinesis.  04/24/2009: 2-D echocardiogram: Left ventricular ejection fraction 20-25%. Diffuse hypokinesis.  Antibiotics:  January 23-23: Zosyn  January 22: Azithromycin  Interim History: Chart reviewed. Subjective: Overall feels okay. Has chest pain with cough and deep breathing. No shortness of breath.  Objective: Filed Vitals:   11/24/11 0200 11/24/11 0400 11/24/11 0600 11/24/11 0800  BP: 102/76 106/77 97/61 101/60  Pulse: 109 104 103 92  Temp:  97.5 F (36.4 C)  97.4 F (36.3 C)  TempSrc:  Oral    Resp: 21 16 13 20   Height:      Weight:      SpO2: 99% 97% 98% 98%    Intake/Output Summary (Last 24 hours) at 11/24/11 0918 Last data filed at 11/24/11 0900  Gross per 24 hour    Intake    799 ml  Output    660 ml  Net    139 ml    Exam:  General: Appears calm and comfortable. Smiles. Makes jokes. Cardiovascular: Regular rate and rhythm. No murmur, rub or gallop. No lower extremity edema. Telemetry: Sinus rhythm. No acute changes. Respiratory: Clear to auscultation bilaterally. No wheezes, rales or rhonchi. Normal respiratory effort.  Data Reviewed: Basic Metabolic Panel:  Lab 11/24/11 8119 11/23/11 2043 11/23/11 1312  NA 137 -- 135  K 3.5 -- 4.6  CL 101 -- 101  CO2 28 -- 23  GLUCOSE 137* -- 161*  BUN 12 -- 12  CREATININE 0.91 -- 0.71  CALCIUM 9.8 -- 9.3  MG -- 1.9 --  PHOS -- 3.2 --   Liver Function Tests:  Lab 11/24/11 0320 11/23/11 2043  AST 24 24  ALT 37* 39*  ALKPHOS 82 82  BILITOT 0.6 0.6  PROT 7.4 7.4  ALBUMIN 3.4* 3.4*    Lab 11/23/11 2043  LIPASE 11  AMYLASE 26   CBC:  Lab 11/24/11 0320 11/23/11 1312  WBC 10.6* 11.9*  NEUTROABS -- 10.0*  HGB 11.9* 12.0  HCT 36.7 36.0  MCV 87.2 87.0  PLT 230 224   Cardiac Enzymes:  Lab 11/24/11 0320 11/23/11 2043 11/23/11 1035  CKTOTAL 45 44 --  CKMB 2.0 2.0 --  CKMBINDEX -- -- --  TROPONINI <0.30 <0.30 <0.30     Recent Results (from the past 240 hour(s))  MRSA PCR SCREENING     Status: Normal   Collection Time   11/23/11 11:00 PM      Component Value Range Status Comment  MRSA by PCR NEGATIVE  NEGATIVE  Final   CULTURE, SPUTUM-ASSESSMENT     Status: Normal   Collection Time   11/24/11  6:16 AM      Component Value Range Status Comment   Specimen Description SPUTUM   Final    Special Requests NONE   Final    Sputum evaluation     Final    Value: MICROSCOPIC FINDINGS SUGGEST THAT THIS SPECIMEN IS NOT REPRESENTATIVE OF LOWER RESPIRATORY SECRETIONS. PLEASE RECOLLECT.     CALLED TO DENNY,C. RN AT 5621 ON 11/24/11 BY GILLESPIE,B.   Report Status 11/24/2011 FINAL   Final      Studies: Dg Chest 2 View  11/23/2011  *RADIOLOGY REPORT*  Clinical Data: Cough, chest pain, smoking  history  CHEST - 2 VIEW  Comparison: Chest x-ray of 11/15/2010  Findings: The lungs are not as well aerated with some basilar volume loss present. There is some more opacity at the left lung base and developing pneumonia cannot be excluded.  Mild cardiomegaly is stable. No bony abnormality is seen.  IMPRESSION: Stable cardiomegaly.  More opacity at the left lung base.  Cannot exclude developing pneumonia.  Original Report Authenticated By: Juline Patch, M.D.   Ct Angio Chest W/cm &/or Wo Cm  11/23/2011  *RADIOLOGY REPORT*  Clinical Data: Pleuritic chest pain.  Chest tightness and shortness of breath.  Symptoms became worse overnight.  Blood tinged cough.  CT ANGIOGRAPHY CHEST  Technique:  Multidetector CT imaging of the chest using the standard protocol during bolus administration of intravenous contrast. Multiplanar reconstructed images including MIPs were obtained and reviewed to evaluate the vascular anatomy.  Contrast: OMNIPAQUE IOHEXOL 300 MG/ML IV SOLN  Comparison: 01/15/2012and 11/23/2011  Findings: Pulmonary arteries are well opacified.  There is no evidence for acute pulmonary embolus.  The heart is enlarged. There are dependent changes consistent with pulmonary edema.  No focal consolidations or pleural effusions are identified.  Images of the upper abdomen are unremarkable.  Mild thoracic degenerative changes are present.  IMPRESSION:  1.  Technically adequate exam showing no evidence for acute pulmonary embolus. 2.  Cardiomegaly and mild changes of pulmonary edema.  Original Report Authenticated By: Patterson Hammersmith, M.D.   EKG: Independently reviewed. January 22 942 AM: Sinus tachycardia. Left atrial enlargement. Left ventricular hypertrophy. Anterolateral ST depression. January 23 9:01 AM: Sinus rhythm. Left atrial enlargement. Left axis deviation. Anterolateral ST depression. No acute changes seen.   Scheduled Meds:   . aspirin EC  325 mg Oral Daily  . azithromycin  500 mg Oral  Daily   Followed by  . azithromycin  250 mg Oral Daily  . cyclobenzaprine  10 mg Oral QHS  . diltiazem  30 mg Oral Q8H  . docusate sodium  100 mg Oral BID  . folic acid  1 mg Oral Daily  . furosemide  60 mg Intravenous BID  . guaiFENesin  1,200 mg Oral BID  . heparin  4,000 Units Intravenous Once  . HYDROcodone-acetaminophen  2 tablet Oral Once  .  HYDROmorphone (DILAUDID) injection  2 mg Intravenous Once  . influenza  inactive virus vaccine  0.5 mL Intramuscular Tomorrow-1000  . levalbuterol  0.63 mg Nebulization Q6H  . loratadine  10 mg Oral Daily  .  morphine injection  6 mg Intravenous Once  . mulitivitamin with minerals  1 tablet Oral Daily  . nitroGLYCERIN  0.1 mg Transdermal Daily  . ondansetron (ZOFRAN) IV  4 mg Intravenous Once  .  piperacillin-tazobactam  3.375 g Intravenous Q8H  . spironolactone  25 mg Oral Daily  . thiamine  100 mg Oral Daily   Continuous Infusions:   . sodium chloride    . heparin 1,000 Units/hr (11/24/11 0432)  . DISCONTD: sodium chloride       Assessment/Plan: 1. Chest pain/possible acute coronary syndrome: Symptoms are atypical and apparently a several day duration. Suspect a musculoskeletal etiology. However her history is complicated by cocaine use. Continue serial cardiac enzymes. Cardiology consultation. 2-D echocardiogram, aspirin. Avoid beta blockers secondary to cocaine use. Followup lipid panel. 2. Active cocaine use: I have recommended cessation. Clinical social work Administrator, sports. 3. Chronic systolic congestive heart failure/nonischemic cardiomyopathy: Appears compensated at this point. Patient will need close outpatient followup and needs to establish with her primary care physician and cardiologist in this area. Continue aspirin, spironolactone; resume lisinopril; change Lasix to oral. 4. Possible aspiration pneumonitis: CT chest negative.  5. Acute bronchitis: Zithromax. 6. Cigarette smoker: I have recommended  cessation. 7. Noncompliance: Case management consultation. Likely will need to followup with [facility]. Needs inexpensive medications.  Code Status: Full code.  Disposition Plan: Home when improved.   Brendia Sacks, MD  [facility] Pager [phone] 11/24/2011, 9:18 AM    LOS: 1 day

## 2011-11-24 NOTE — Consult Note (Signed)
CARDIOLOGY CONSULT NOTE  Referring Physician:  [facility] Admit Date: 11/23/2011  Reason for consultation:  Chest pain  [name] is a 51 y.o. female with a h/o ongoing polysubstance abuse, nonischemic CM, and HTN who presents complaining of chest pain.  She has a h/o nonischemic CM for which she was previously followed by Drs Shana Chute and Sharyn Lull.  She reports that she stopped seeing them as she could "not afford to pay her bill".  She was seen briefly with financial assistance by a cardiologist at [facility] but stopped seeing him also even though she had financial assistance.  She has been noncompliant with her medications. She reports having chronic dyspnea with moderate activity.  She has occasional orthopnea.  She denies edema. 2 days ago, she used cocaine due to "feeling depressed".  She then began having substernal chest tightness with SOB.  She has been admitted to [facility] where her CMs have been negative.  Her chest pain has now resolved.  Her SOB is improving.   Past Medical History  Diagnosis Date  . Bronchitis   . Nonischemic cardiomyopathy   . Chronic systolic dysfunction of left ventricle   . Hypertension   . LVH (left ventricular hypertrophy)   . Tobacco abuse disorder   . Polysubstance abuse   . History of noncompliance with medical treatment    Past Surgical History  Procedure Date  . Abdominal hysterectomy   . Abdominal surgery   . Uterine fibroid surgery        . aspirin EC  325 mg Oral Daily  . azithromycin  500 mg Oral Daily   Followed by  . azithromycin  250 mg Oral Daily  . cyclobenzaprine  10 mg Oral QHS  . diltiazem  30 mg Oral Q8H  . docusate sodium  100 mg Oral BID  . folic acid  1 mg Oral Daily  . furosemide  40 mg Oral Daily  . guaiFENesin  1,200 mg Oral BID  . heparin  4,000 Units Intravenous Once  . influenza  inactive virus vaccine  0.5 mL Intramuscular Tomorrow-1000  . levalbuterol  0.63 mg Nebulization Q6H  . lisinopril  5 mg Oral  Daily  . loratadine  10 mg Oral Daily  .  morphine injection  6 mg Intravenous Once  . mulitivitamin with minerals  1 tablet Oral Daily  . nitroGLYCERIN  0.1 mg Transdermal Daily  . ondansetron (ZOFRAN) IV  4 mg Intravenous Once  . spironolactone  25 mg Oral Daily  . thiamine  100 mg Oral Daily  . DISCONTD: furosemide  60 mg Intravenous BID  . DISCONTD: furosemide  40 mg Oral Daily  . DISCONTD: piperacillin-tazobactam  3.375 g Intravenous Q8H      . sodium chloride 20 mL/hr at 11/24/11 0929  . heparin 1,150 Units/hr (11/24/11 1333)  . DISCONTD: sodium chloride      Allergies  Allergen Reactions  . Codeine Nausea And Vomiting    History   Social History  . Marital Status: Single    Spouse Name: N/A    Number of Children: N/A  . Years of Education: N/A   Occupational History  . Not on file.   Social History Main Topics  . Smoking status: Current Everyday Smoker -- 0.5 packs/day for 30 years  . Smokeless tobacco: Never Used   Comment: she is not ready to quit  . Alcohol Use: Yes     glass of wine each night, had bourbon last night but no wine  .  Drug Use: Yes    Special: Marijuana     last used cocaine 2 days ago  . Sexually Active: Not on file   Other Topics Concern  . Not on file   Social History Narrative   [place]   ROS- All systems are reviewed and negative except as per the HPI above  Physical Exam: Telemetry: Filed Vitals:   11/24/11 1000 11/24/11 1045 11/24/11 1200 11/24/11 1340  BP: 100/59  91/64 100/74  Pulse: 95  100 102  Temp:   98.5 F (36.9 C)   TempSrc:   Oral   Resp: 16  16 22   Height:      Weight:      SpO2: 97% 99% 97% 100%    GEN- The patient is chronically ill appearing, alert and oriented x 3 today.   Head- normocephalic, atraumatic Eyes-  Sclera clear, conjunctiva pink Ears- hearing intact Oropharynx- clear Neck- supple, JVP 9cm Lymph- no cervical lymphadenopathy Lungs- Clear to ausculation bilaterally,  normal work of breathing Heart- tachycardic regular rate and rhythm, no murmurs, rubs or gallops, PMI not laterally displaced GI- soft, NT, ND, + BS Extremities- no clubbing, cyanosis, or edema MS- no significant deformity or atrophy Skin- no rash or lesion Psych- euthymic mood, full affect Neuro- strength and sensation are intact  EKG:  Sinus rhythm with LVH and repolarization abnormality  Labs:   Lab Results  Component Value Date   WBC 10.6* 11/24/2011   HGB 11.9* 11/24/2011   HCT 36.7 11/24/2011   MCV 87.2 11/24/2011   PLT 230 11/24/2011    Lab 11/24/11 0320  NA 137  K 3.5  CL 101  CO2 28  BUN 12  CREATININE 0.91  CALCIUM 9.8  PROT 7.4  BILITOT 0.6  ALKPHOS 82  ALT 37*  AST 24  GLUCOSE 137*   Lab Results  Component Value Date   CKTOTAL 31 11/24/2011   CKMB 1.8 11/24/2011   TROPONINI <0.30 11/24/2011    Lab Results  Component Value Date   CHOL 202* 11/23/2011   CHOL 245* 09/07/2010   CHOL 246* 06/23/2010   Lab Results  Component Value Date   HDL 53 11/23/2011   HDL 61 29/03/6212   HDL 68 0/86/5784   Lab Results  Component Value Date   LDLCALC 108* 11/23/2011   LDLCALC 128* 09/07/2010   LDLCALC 113* 06/23/2010   Lab Results  Component Value Date   TRIG 207* 11/23/2011   TRIG 279* 09/07/2010   TRIG 326* 06/23/2010   Lab Results  Component Value Date   CHOLHDL 3.8 11/23/2011   CHOLHDL 4.0 Ratio 09/07/2010   CHOLHDL 3.6 Ratio 06/23/2010   No results found for this basename: LDLDIRECT      Radiology: reviewed  Echo:  reviewed  ASSESSMENT AND PLAN:   The patient has a h/o nonischemic CM (likely due to cocaine) with ongoing cocaine use.  She presents with cocaine induced CP which has now resolved.    1. Chest pain- CMs are negative CP likely due to cocaine The importance of compliance with medication and cocaine cessation was discussed at length Stop heparin gtt Would transfer to telemetry  2.  Nonischemic CM- chronic NYHA Class II/ III CHF Medical  compliance is important long term Given reduced EF, will stop diltiazem Will add Coreg (has both alpha and beta properties) Cocaine cessation!! Due to ongoing noncompliance, she is not an ICD candidate at this time  3. Cocaine/ Tobacco abuse- as above, she is  not ready to quit smoking when counseled.   Transfer to telemetry I have spoken with Dr Sharyn Lull who has agreed to see the patient again upon discharge. She will be seen by [facility] cardiology while here.   Hillis Range, MD 11/24/2011  3:13 PM

## 2011-11-24 NOTE — Progress Notes (Signed)
ANTICOAGULATION CONSULT NOTE - Follow Up Consult  Pharmacy Consult for Heparin Indication: chest pain/ACS  Allergies  Allergen Reactions  . Codeine Nausea And Vomiting    Patient Measurements: Height: 5\' 4"  (162.6 cm) Weight: 150 lb 12.7 oz (68.4 kg) IBW/kg (Calculated) : 54.7  Heparin Dosing Weight:   Vital Signs: Temp: 98.5 F (36.9 C) (01/23 1200) Temp src: Oral (01/23 1200) BP: 91/64 mmHg (01/23 1200) Pulse Rate: 100  (01/23 1200)  Labs:  Basename 11/24/11 1207 11/24/11 0400 11/24/11 0320 11/23/11 2043 11/23/11 1312  HGB -- -- 11.9* -- 12.0  HCT -- -- 36.7 -- 36.0  PLT -- -- 230 -- 224  APTT -- -- 74* 39* --  LABPROT -- -- 15.2 13.9 --  INR -- -- 1.18 1.05 --  HEPARINUNFRC 0.26* 0.23* -- -- --  CREATININE -- -- 0.91 -- 0.71  CKTOTAL 31 -- 45 44 --  CKMB 1.8 -- 2.0 2.0 --  TROPONINI <0.30 -- <0.30 <0.30 --   Estimated Creatinine Clearance: 68.7 ml/min (by C-G formula based on Cr of 0.91).   Medications:  Infusions:     . sodium chloride 20 mL/hr at 11/24/11 0929  . heparin 1,000 Units/hr (11/24/11 0432)  . DISCONTD: sodium chloride      Assessment:  50 YOF admitted 1/22 with CP. CT negative for PE. Started heparin 1/22.   Heparin currently running at 1000 units/hr. HL subtherapeutic at 0.26 .   CE negative. ASA, Dilt, spironolactone. ?Consult to cardiology. No bleeding/complications noted.  Goal of Therapy:  Heparin level 0.3-0.7 units/ml   Plan:   Increase heparin drip to 1150 units/hr  Heparin level in 6 hours  Geoffry Paradise Thi 11/24/2011,1:23 PM

## 2011-11-24 NOTE — Progress Notes (Signed)
UR CHART REVIEW FOR MEDICAL NECESSITY; B. Jabe Jeanbaptiste RN, BSN, MHA

## 2011-11-25 DIAGNOSIS — I509 Heart failure, unspecified: Secondary | ICD-10-CM

## 2011-11-25 DIAGNOSIS — R079 Chest pain, unspecified: Secondary | ICD-10-CM

## 2011-11-25 DIAGNOSIS — I428 Other cardiomyopathies: Secondary | ICD-10-CM

## 2011-11-25 MED ORDER — LISINOPRIL 5 MG PO TABS: 5.0000 mg | ORAL_TABLET | Freq: Every day | ORAL | Status: DC

## 2011-11-25 MED ORDER — AZITHROMYCIN 250 MG PO TABS
250.0000 mg | ORAL_TABLET | Freq: Once | ORAL | Status: DC
  Filled 2011-11-25: qty 1

## 2011-11-25 MED ORDER — CARVEDILOL 6.25 MG PO TABS: 6.2500 mg | ORAL_TABLET | Freq: Two times a day (BID) | ORAL | Status: DC

## 2011-11-25 MED ORDER — AZITHROMYCIN 250 MG PO TABS: 250.0000 mg | ORAL_TABLET | Freq: Every day | ORAL | Status: DC

## 2011-11-25 MED ORDER — PRAVASTATIN SODIUM 40 MG PO TABS: 40.0000 mg | ORAL_TABLET | Freq: Every day | ORAL | Status: DC

## 2011-11-25 MED ORDER — FUROSEMIDE 40 MG PO TABS: 40.0000 mg | ORAL_TABLET | Freq: Every day | ORAL | Status: DC

## 2011-11-25 MED ORDER — SPIRONOLACTONE 25 MG PO TABS: 25.0000 mg | ORAL_TABLET | Freq: Every day | ORAL | Status: DC

## 2011-11-25 NOTE — Progress Notes (Signed)
Talked to patient about DCP/ follow up medical care; patient has been to [facility] and is willing to return; Eligibility apt December 10, 2011 at 4pm and apt with Dr Andrey Campanile January 14, 2012 at 9 am; information given to the patient. Talked to the patient about substance abuse counseling; patient would not admit to using anything except marijuana and stated that some one is working with her on that. Abelino Derrick RN, BSN, MHA

## 2011-11-25 NOTE — Progress Notes (Signed)
PROGRESS NOTE  [name]:[id number] DOB: 04-25-1961 DOA: 11/23/2011 PCP: None.  Cardiologist: M. Sharyn Lull, MD  Brief narrative: 51 year old woman presented with chest pain. History of productive cough for one week with associated pleuritic chest pain, left-sided. Worse with arm movement. Chest pain began one day prior to admission. Also endorsed orthopnea and dyspnea on exertion. Noncompliant with medication. EKG reported to show diffuse T-wave inversion. Started on IV heparin for possible ACS. Referred to the hospitalist service for admission for chest pain, CHF, polysubstance abuse.  Has a history of nonischemic dilated cardiomyopathy and was last admitted to the system in January of 2012. At that time consideration for AICD implantation was given. Was seen as an inpatient by Dr. Sharyn Lull in 2012 but did not followup with him in the outpatient setting. Has not seen Dr. Shana Chute in over 2 years.   Past medical history: Active cocaine use, nonischemic dilated cardiomyopathy with left ventricular ejection fraction 15-20%, hypertension, hyperlipidemia, cigarette smoker  Consultants:  Cardiology  Procedures:  11/24/2011:Mildly dilated LV with diffuse LV hypokinesis, EF 25-30%. Severe diastolic dysfunction with evidence for elevated LV filling pressure. Normal RV size with mildly decreased systolic function. Mild pulmonary hypertension.  11/16/2010: 2-D echocardiogram: Left ventricular ejection fraction 15-20%. Severe diffuse hypokinesis.  04/24/2009: 2-D echocardiogram: Left ventricular ejection fraction 20-25%. Diffuse hypokinesis.  Antibiotics:  January 23-23: Zosyn  January 22: Azithromycin  Interim History: Chart reviewed. Cardiology recommendations reviewed. Subjective: Feels better. Would like to go home. Still some orthopnea.  Objective: Filed Vitals:   11/25/11 0918 11/25/11 0919 11/25/11 0928 11/25/11 1420  BP: 125/85 125/85    Pulse:  117    Temp:      TempSrc:       Resp:      Height:      Weight:      SpO2:   96% 98%    Intake/Output Summary (Last 24 hours) at 11/25/11 1429 Last data filed at 11/25/11 1225  Gross per 24 hour  Intake    800 ml  Output   2875 ml  Net  -2075 ml    Exam:  General: Appears calm and comfortable. Smiles.  Cardiovascular: Regular rate and rhythm. No murmur, rub or gallop. No lower extremity edema. Telemetry: Sinus rhythm. No acute changes. Respiratory: Clear to auscultation bilaterally. No wheezes, rales or rhonchi. Normal respiratory effort.  Data Reviewed: Basic Metabolic Panel:  Lab 11/24/11 8119 11/23/11 2043 11/23/11 1312  NA 137 -- 135  K 3.5 -- 4.6  CL 101 -- 101  CO2 28 -- 23  GLUCOSE 137* -- 161*  BUN 12 -- 12  CREATININE 0.91 -- 0.71  CALCIUM 9.8 -- 9.3  MG -- 1.9 --  PHOS -- 3.2 --   Liver Function Tests:  Lab 11/24/11 0320 11/23/11 2043  AST 24 24  ALT 37* 39*  ALKPHOS 82 82  BILITOT 0.6 0.6  PROT 7.4 7.4  ALBUMIN 3.4* 3.4*    Lab 11/23/11 2043  LIPASE 11  AMYLASE 26   CBC:  Lab 11/24/11 0320 11/23/11 1312  WBC 10.6* 11.9*  NEUTROABS -- 10.0*  HGB 11.9* 12.0  HCT 36.7 36.0  MCV 87.2 87.0  PLT 230 224   Cardiac Enzymes:  Lab 11/24/11 1207 11/24/11 0320 11/23/11 2043 11/23/11 1035  CKTOTAL 31 45 44 --  CKMB 1.8 2.0 2.0 --  CKMBINDEX -- -- -- --  TROPONINI <0.30 <0.30 <0.30 <0.30     Recent Results (from the past 240 hour(s))  MRSA PCR  SCREENING     Status: Normal   Collection Time   11/23/11 11:00 PM      Component Value Range Status Comment   MRSA by PCR NEGATIVE  NEGATIVE  Final   URINE CULTURE     Status: Normal (Preliminary result)   Collection Time   11/24/11 12:49 AM      Component Value Range Status Comment   Specimen Description URINE, CATHETERIZED   Final    Special Requests NONE   Final    Setup Time [id number]   Final    Colony Count PENDING   Incomplete    Culture Culture reincubated for better growth   Final    Report Status PENDING    Incomplete   CULTURE, SPUTUM-ASSESSMENT     Status: Normal   Collection Time   11/24/11  6:16 AM      Component Value Range Status Comment   Specimen Description SPUTUM   Final    Special Requests NONE   Final    Sputum evaluation     Final    Value: MICROSCOPIC FINDINGS SUGGEST THAT THIS SPECIMEN IS NOT REPRESENTATIVE OF LOWER RESPIRATORY SECRETIONS. PLEASE RECOLLECT.     CALLED TO DENNY,C. RN AT 9562 ON 11/24/11 BY GILLESPIE,B.   Report Status 11/24/2011 FINAL   Final      Studies: Dg Chest 2 View  11/23/2011  *RADIOLOGY REPORT*  Clinical Data: Cough, chest pain, smoking history  CHEST - 2 VIEW  Comparison: Chest x-ray of 11/15/2010  Findings: The lungs are not as well aerated with some basilar volume loss present. There is some more opacity at the left lung base and developing pneumonia cannot be excluded.  Mild cardiomegaly is stable. No bony abnormality is seen.  IMPRESSION: Stable cardiomegaly.  More opacity at the left lung base.  Cannot exclude developing pneumonia.  Original Report Authenticated By: Juline Patch, M.D.   Ct Angio Chest W/cm &/or Wo Cm  11/23/2011  *RADIOLOGY REPORT*  Clinical Data: Pleuritic chest pain.  Chest tightness and shortness of breath.  Symptoms became worse overnight.  Blood tinged cough.  CT ANGIOGRAPHY CHEST  Technique:  Multidetector CT imaging of the chest using the standard protocol during bolus administration of intravenous contrast. Multiplanar reconstructed images including MIPs were obtained and reviewed to evaluate the vascular anatomy.  Contrast: OMNIPAQUE IOHEXOL 300 MG/ML IV SOLN  Comparison: 01/15/2012and 11/23/2011  Findings: Pulmonary arteries are well opacified.  There is no evidence for acute pulmonary embolus.  The heart is enlarged. There are dependent changes consistent with pulmonary edema.  No focal consolidations or pleural effusions are identified.  Images of the upper abdomen are unremarkable.  Mild thoracic degenerative changes are  present.  IMPRESSION:  1.  Technically adequate exam showing no evidence for acute pulmonary embolus. 2.  Cardiomegaly and mild changes of pulmonary edema.  Original Report Authenticated By: Patterson Hammersmith, M.D.   EKG: Independently reviewed. January 22 942 AM: Sinus tachycardia. Left atrial enlargement. Left ventricular hypertrophy. Anterolateral ST depression. January 23 9:01 AM: Sinus rhythm. Left atrial enlargement. Left axis deviation. Anterolateral ST depression. No acute changes seen.   Scheduled Meds:    . aspirin EC  325 mg Oral Daily  . azithromycin  250 mg Oral Daily  . carvedilol  6.25 mg Oral BID WC  . cyclobenzaprine  10 mg Oral QHS  . diphenhydrAMINE  25 mg Oral Once  . docusate sodium  100 mg Oral BID  . folic acid  1  mg Oral Daily  . furosemide  40 mg Oral Daily  . guaiFENesin  1,200 mg Oral BID  . levalbuterol  0.63 mg Nebulization Q6H  . lisinopril  5 mg Oral Daily  . loratadine  10 mg Oral Daily  . mulitivitamin with minerals  1 tablet Oral Daily  . polyethylene glycol  17 g Oral Daily  . spironolactone  25 mg Oral Daily  . thiamine  100 mg Oral Daily  . DISCONTD: diltiazem  30 mg Oral Q8H  . DISCONTD: nitroGLYCERIN  0.1 mg Transdermal Daily   Continuous Infusions:    . sodium chloride 20 mL/hr at 11/24/11 0929  . DISCONTD: heparin 1,150 Units/hr (11/24/11 1333)     Assessment/Plan: 1. Chest pain/possible acute coronary syndrome: Suspect a musculoskeletal etiology. However her history is complicated by cocaine use. Cardiology recommendations appreciated.  2. Active cocaine use: I have recommended cessation. Clinical social work Administrator, sports. 3. Chronic systolic congestive heart failure/nonischemic cardiomyopathy: Appears compensated at this point. Patient will need close outpatient followup and needs to establish with her primary care physician and cardiologist in this area. Continue aspirin, spironolactone, lisinopril, Lasix; 4. Hyperlipidemia: Continue  statin therapy. 5. Acute bronchitis: Zithromax. 6. Cigarette smoker: I have recommended cessation. 7. Noncompliance: Followup with [facility]. Needs inexpensive medications.  Code Status: Full code.  Disposition Plan: Home when improved.   Brendia Sacks, MD  [facility] Pager [phone] 11/25/2011, 2:29 PM    LOS: 2 days

## 2011-11-25 NOTE — Progress Notes (Signed)
TELEMETRY: Reviewed telemetry pt in NSR rate 90-110: Filed Vitals:   11/25/11 0208 11/25/11 0303 11/25/11 0537 11/25/11 0918  BP: 119/83  131/93 125/85  Pulse: 106  112   Temp: 97.8 F (36.6 C)  98.1 F (36.7 C)   TempSrc: Oral  Oral   Resp: 20  20   Height: 5\' 4"  (1.626 m)     Weight: 67.767 kg (149 lb 6.4 oz)     SpO2: 97% 95% 95%     Intake/Output Summary (Last 24 hours) at 11/25/11 0924 Last data filed at 11/25/11 0700  Gross per 24 hour  Intake 1210.68 ml  Output   3115 ml  Net -1904.32 ml    SUBJECTIVE Chest pain much better. Denies SOB. Anxious to go home. Wants foley out.  LABS: Basic Metabolic Panel:  Basename 11/24/11 0320 11/23/11 2043 11/23/11 1312  NA 137 -- 135  K 3.5 -- 4.6  CL 101 -- 101  CO2 28 -- 23  GLUCOSE 137* -- 161*  BUN 12 -- 12  CREATININE 0.91 -- 0.71  CALCIUM 9.8 -- 9.3  MG -- 1.9 --  PHOS -- 3.2 --   Liver Function Tests:  [facility] 11/24/11 0320 11/23/11 2043  AST 24 24  ALT 37* 39*  ALKPHOS 82 82  BILITOT 0.6 0.6  PROT 7.4 7.4  ALBUMIN 3.4* 3.4*    Basename 11/23/11 2043  LIPASE 11  AMYLASE 26   CBC:  Basename 11/24/11 0320 11/23/11 1312  WBC 10.6* 11.9*  NEUTROABS -- 10.0*  HGB 11.9* 12.0  HCT 36.7 36.0  MCV 87.2 87.0  PLT 230 224   Cardiac Enzymes:  Basename 11/24/11 1207 11/24/11 0320 11/23/11 2043  CKTOTAL 31 45 44  CKMB 1.8 2.0 2.0  CKMBINDEX -- -- --  TROPONINI <0.30 <0.30 <0.30   BNP: No components found with this basename: POCBNP:3 D-Dimer:  Basename 11/23/11 1035  DDIMER 1.50*   Hemoglobin A1C: No results found for this basename: HGBA1C in the last 72 hours Fasting Lipid Panel:  Basename 11/23/11 2043  CHOL 202*  HDL 53  LDLCALC 108*  TRIG 207*  CHOLHDL 3.8  LDLDIRECT --   Thyroid Function Tests:  Basename 11/23/11 2043  TSH 0.902  T4TOTAL --  T3FREE --  THYROIDAB --   Anemia Panel: No results found for this basename: VITAMINB12,FOLATE,FERRITIN,TIBC,IRON,RETICCTPCT in the  last 72 hours  Radiology/Studies:  Dg Chest 2 View  11/23/2011  *RADIOLOGY REPORT*  Clinical Data: Cough, chest pain, smoking history  CHEST - 2 VIEW  Comparison: Chest x-ray of 11/15/2010  Findings: The lungs are not as well aerated with some basilar volume loss present. There is some more opacity at the left lung base and developing pneumonia cannot be excluded.  Mild cardiomegaly is stable. No bony abnormality is seen.  IMPRESSION: Stable cardiomegaly.  More opacity at the left lung base.  Cannot exclude developing pneumonia.  Original Report Authenticated By: Juline Patch, M.D.   Ct Angio Chest W/cm &/or Wo Cm  11/23/2011  *RADIOLOGY REPORT*  Clinical Data: Pleuritic chest pain.  Chest tightness and shortness of breath.  Symptoms became worse overnight.  Blood tinged cough.  CT ANGIOGRAPHY CHEST  Technique:  Multidetector CT imaging of the chest using the standard protocol during bolus administration of intravenous contrast. Multiplanar reconstructed images including MIPs were obtained and reviewed to evaluate the vascular anatomy.  Contrast: OMNIPAQUE IOHEXOL 300 MG/ML IV SOLN  Comparison: 01/15/2012and 11/23/2011  Findings: Pulmonary arteries are well opacified.  There is no evidence for acute pulmonary embolus.  The heart is enlarged. There are dependent changes consistent with pulmonary edema.  No focal consolidations or pleural effusions are identified.  Images of the upper abdomen are unremarkable.  Mild thoracic degenerative changes are present.  IMPRESSION:  1.  Technically adequate exam showing no evidence for acute pulmonary embolus. 2.  Cardiomegaly and mild changes of pulmonary edema.  Original Report Authenticated By: Patterson Hammersmith, M.D.    PHYSICAL EXAM General: Well developed, well nourished, in no acute distress. Head: Normocephalic, atraumatic, sclera non-icteric, no xanthomas Neck: Negative for carotid bruits. JVD not elevated. Lungs: Clear bilaterally to auscultation  without wheezes, rales, or rhonchi. Breathing is unlabored. Heart: RRR S1 S2 without murmurs, rubs, or gallops.  Abdomen: Soft, non-tender, non-distended with normoactive bowel sounds. No hepatomegaly. No rebound/guarding. No obvious abdominal masses. Msk:  Strength and tone appears normal for age. Extremities: No clubbing, cyanosis or edema.  Distal pedal pulses are 2+ and equal bilaterally. Neuro: Alert and oriented X 3. Moves all extremities spontaneously. Psych:  Responds to questions appropriately with a normal affect.  ASSESSMENT AND PLAN:  1. Chest pain. Improved. No evidence of MI. Likely related to cocaine use.   2. Nonischemic cardiomyopathy. CHF clinically improved. Noncompliant with prior regimen. Now on coreg, lisinopril, aldactone and lasix. Well compensated now.  3. Polysubstance abuse.  Plan: On appropriate therapy for now. Can titrate ACEi and Coreg further as outpatient. Will need follow up with Dr. Sharyn Lull as outpatient. We will sign off for now.  Active Problems:  ACS (acute coronary syndrome)  CHF, acute  Cocaine abuse  Tobacco abuse  Pneumonitis, aspiration  Cardiomyopathy  HTN (hypertension), malignant  Chronic systolic dysfunction of left ventricle    Signed, Peter [place] MD, [facility]

## 2011-11-25 NOTE — Progress Notes (Signed)
Pt tx to 1427 VSS.

## 2011-11-25 NOTE — Discharge Summary (Signed)
Physician Discharge Summary  JORGE AMPARO WGN:[id number] DOB: Sep 17, 1961 DOA: 11/23/2011 PCP: [facility] Dock Cardiologist: M. Sharyn Lull, MD   Admit date: 11/23/2011 Discharge date: 11/25/2011  Discharge Diagnoses:  1. Atypical chest pain, cocaine induced 2. Active cocaine use 3. Chronic systolic congestive heart failure 4. Chronic diastolic congestive heart failure 5. Hyperlipidemia 6. Acute bronchitis 7. Polysubstance abuse 8. Noncompliance  Discharge Condition: Improved  Disposition: Home  History of present illness:  51 year old woman presented with chest pain. History of productive cough for one week with associated pleuritic chest pain, left-sided. Worse with arm movement. Chest pain began one day prior to admission. Also endorsed orthopnea and dyspnea on exertion. Noncompliant with medication. EKG reported to show diffuse T-wave inversion. Started on IV heparin for possible ACS. Referred to the hospitalist service for admission for chest pain, CHF, polysubstance abuse.  Has a history of nonischemic dilated cardiomyopathy and was last admitted to the system in January of 2012. At that time consideration for AICD implantation was given. Was seen as an inpatient by Dr. Sharyn Lull in 2012 but did not followup with him in the outpatient setting. Has not seen Dr. Shana Chute in over 2 years.  Hospital Course:  Ms. Brunet was admitted to the medical floor. She ruled out with serial cardiac enzymes. She is in consultation with cardiology. Her chest pain was felt to be cocaine-induced and no further recommendations as far as testing where made. 2-D echocardiogram consistent with previously known systolic dysfunction. Also noted to have diastolic dysfunction. Cleared for discharge stay by cardiology. I have recommended cocaine cessation. It is important she take all her medications as charted. She can receive all her heart medications at [facility]. [facility]  with Dr. Sharyn Lull as an outpatient. These and other issues delineated below. 1. Chest pain/possible acute coronary syndrome: Suspect a musculoskeletal etiology. However her history is complicated by cocaine use. Cardiology recommendations appreciated.   2. Active cocaine use: I have recommended cessation.  3. Chronic systolic congestive heart failure/nonischemic cardiomyopathy: Appears compensated at this point. Patient will need close outpatient followup and needs to establish with her primary care physician and cardiologist in this area. Continue aspirin, spironolactone, lisinopril, Lasix, chloride 4. Hyperlipidemia: Continue statin therapy.  5. Acute bronchitis: Zithromax.  6. Cigarette smoker: I have recommended cessation.  7. Noncompliance: Followup with [facility].   Consultants:  Cardiology  Procedures:  11/24/2011: 2-D echocardiogram: Mildly dilated LV with diffuse LV hypokinesis, EF 25-30%. Severe diastolic dysfunction with evidence for elevated LV filling pressure. Normal RV size with mildly decreased systolic function. Mild pulmonary hypertension.   11/16/2010: 2-D echocardiogram: Left ventricular ejection fraction 15-20%. Severe diffuse hypokinesis.   04/24/2009: 2-D echocardiogram: Left ventricular ejection fraction 20-25%. Diffuse hypokinesis.  Antibiotics:  January 23-23: Zosyn   January 22: Azithromycin  Discharge Instructions  Discharge Orders    Future Orders Please Complete By Expires   Diet - low sodium heart healthy      Activity as tolerated - No restrictions        Medication List  As of 11/25/2011  3:06 PM   TAKE these medications         aspirin 81 MG EC tablet   Take 81 mg by mouth daily. Swallow whole.      azithromycin 250 MG tablet   Commonly known as: ZITHROMAX   Take 1 tablet (250 mg total) by mouth daily. Start January 25.      carvedilol 6.25 MG tablet   Commonly known  as: COREG   Take 1 tablet (6.25 mg total) by mouth 2 (two) times daily  with a meal.      cyclobenzaprine 10 MG tablet   Commonly known as: FLEXERIL   Take 10 mg by mouth at bedtime.      folic acid 1 MG tablet   Commonly known as: FOLVITE   Take 1 mg by mouth daily.      furosemide 40 MG tablet   Commonly known as: LASIX   Take 1 tablet (40 mg total) by mouth daily.      lisinopril 5 MG tablet   Commonly known as: PRINIVIL,ZESTRIL   Take 1 tablet (5 mg total) by mouth daily.      loratadine 10 MG tablet   Commonly known as: CLARITIN   Take 10 mg by mouth daily.      mulitivitamin with minerals Tabs   Take 1 tablet by mouth daily.      pravastatin 40 MG tablet   Commonly known as: PRAVACHOL   Take 1 tablet (40 mg total) by mouth daily.      spironolactone 25 MG tablet   Commonly known as: ALDACTONE   Take 1 tablet (25 mg total) by mouth daily.      thiamine 100 MG tablet   Take 100 mg by mouth daily.           Follow-up Information    Follow up with [facility] on 01/14/2012. (at 9am; Eligibility apt with [facility] is December 10, 2011 at 4pm)           The results of significant diagnostics from this hospitalization (including imaging, microbiology, ancillary and laboratory) are listed below for reference.    Significant Diagnostic Studies: Dg Chest 2 View  11/23/2011  *RADIOLOGY REPORT*  Clinical Data: Cough, chest pain, smoking history  CHEST - 2 VIEW  Comparison: Chest x-ray of 11/15/2010  Findings: The lungs are not as well aerated with some basilar volume loss present. There is some more opacity at the left lung base and developing pneumonia cannot be excluded.  Mild cardiomegaly is stable. No bony abnormality is seen.  IMPRESSION: Stable cardiomegaly.  More opacity at the left lung base.  Cannot exclude developing pneumonia.  Original Report Authenticated By: Juline Patch, M.D.   Ct Angio Chest W/cm &/or Wo Cm  11/23/2011  *RADIOLOGY REPORT*  Clinical Data: Pleuritic chest pain.  Chest tightness and shortness of breath.   Symptoms became worse overnight.  Blood tinged cough.  CT ANGIOGRAPHY CHEST  Technique:  Multidetector CT imaging of the chest using the standard protocol during bolus administration of intravenous contrast. Multiplanar reconstructed images including MIPs were obtained and reviewed to evaluate the vascular anatomy.  Contrast: OMNIPAQUE IOHEXOL 300 MG/ML IV SOLN  Comparison: 01/15/2012and 11/23/2011  Findings: Pulmonary arteries are well opacified.  There is no evidence for acute pulmonary embolus.  The heart is enlarged. There are dependent changes consistent with pulmonary edema.  No focal consolidations or pleural effusions are identified.  Images of the upper abdomen are unremarkable.  Mild thoracic degenerative changes are present.  IMPRESSION:  1.  Technically adequate exam showing no evidence for acute pulmonary embolus. 2.  Cardiomegaly and mild changes of pulmonary edema.  Original Report Authenticated By: Patterson Hammersmith, M.D.    Microbiology: Recent Results (from the past 240 hour(s))  MRSA PCR SCREENING     Status: Normal   Collection Time   11/23/11 11:00 PM  Component Value Range Status Comment   MRSA by PCR NEGATIVE  NEGATIVE  Final   URINE CULTURE     Status: Normal (Preliminary result)   Collection Time   11/24/11 12:49 AM      Component Value Range Status Comment   Specimen Description URINE, CATHETERIZED   Final    Special Requests NONE   Final    Setup Time [id number]   Final    Colony Count PENDING   Incomplete    Culture Culture reincubated for better growth   Final    Report Status PENDING   Incomplete   CULTURE, SPUTUM-ASSESSMENT     Status: Normal   Collection Time   11/24/11  6:16 AM      Component Value Range Status Comment   Specimen Description SPUTUM   Final    Special Requests NONE   Final    Sputum evaluation     Final    Value: MICROSCOPIC FINDINGS SUGGEST THAT THIS SPECIMEN IS NOT REPRESENTATIVE OF LOWER RESPIRATORY SECRETIONS. PLEASE RECOLLECT.      CALLED TO DENNY,C. RN AT 8295 ON 11/24/11 BY GILLESPIE,B.   Report Status 11/24/2011 FINAL   Final      Labs: Basic Metabolic Panel:  Lab 11/24/11 6213 11/23/11 2043 11/23/11 1312  NA 137 -- 135  K 3.5 -- 4.6  CL 101 -- 101  CO2 28 -- 23  GLUCOSE 137* -- 161*  BUN 12 -- 12  CREATININE 0.91 -- 0.71  CALCIUM 9.8 -- 9.3  MG -- 1.9 --  PHOS -- 3.2 --   Liver Function Tests:  Lab 11/24/11 0320 11/23/11 2043  AST 24 24  ALT 37* 39*  ALKPHOS 82 82  BILITOT 0.6 0.6  PROT 7.4 7.4  ALBUMIN 3.4* 3.4*    Lab 11/23/11 2043  LIPASE 11  AMYLASE 26   CBC:  Lab 11/24/11 0320 11/23/11 1312  WBC 10.6* 11.9*  NEUTROABS -- 10.0*  HGB 11.9* 12.0  HCT 36.7 36.0  MCV 87.2 87.0  PLT 230 224   Cardiac Enzymes:  Lab 11/24/11 1207 11/24/11 0320 11/23/11 2043 11/23/11 1035  CKTOTAL 31 45 44 --  CKMB 1.8 2.0 2.0 --  CKMBINDEX -- -- -- --  TROPONINI <0.30 <0.30 <0.30 <0.30    Time coordinating discharge: 35 minutes.  Signed:  Brendia Sacks, MD  [facility] 11/25/2011, 3:06 PM

## 2011-11-27 LAB — URINE CULTURE

## 2012-03-15 ENCOUNTER — Other Ambulatory Visit: Payer: Self-pay | Admitting: Cardiology

## 2012-03-29 DIAGNOSIS — I10 Essential (primary) hypertension: Secondary | ICD-10-CM | POA: Insufficient documentation

## 2012-04-12 ENCOUNTER — Other Ambulatory Visit: Payer: Self-pay | Admitting: Cardiology

## 2012-08-28 ENCOUNTER — Encounter (HOSPITAL_COMMUNITY): Payer: Self-pay | Admitting: *Deleted

## 2012-08-28 ENCOUNTER — Emergency Department (HOSPITAL_COMMUNITY)
Admission: EM | Admit: 2012-08-28 | Discharge: 2012-08-29 | Disposition: A | Discharge status: Home / Self Care | Payer: Self-pay | Attending: Emergency Medicine | Admitting: Emergency Medicine

## 2012-08-28 DIAGNOSIS — G43909 Migraine, unspecified, not intractable, without status migrainosus: Secondary | ICD-10-CM | POA: Insufficient documentation

## 2012-08-28 DIAGNOSIS — R209 Unspecified disturbances of skin sensation: Secondary | ICD-10-CM | POA: Insufficient documentation

## 2012-08-28 DIAGNOSIS — Z91199 Patient's noncompliance with other medical treatment and regimen due to unspecified reason: Secondary | ICD-10-CM | POA: Insufficient documentation

## 2012-08-28 DIAGNOSIS — I10 Essential (primary) hypertension: Secondary | ICD-10-CM | POA: Insufficient documentation

## 2012-08-28 DIAGNOSIS — Z7982 Long term (current) use of aspirin: Secondary | ICD-10-CM | POA: Insufficient documentation

## 2012-08-28 DIAGNOSIS — Z87891 Personal history of nicotine dependence: Secondary | ICD-10-CM | POA: Insufficient documentation

## 2012-08-28 DIAGNOSIS — Z79899 Other long term (current) drug therapy: Secondary | ICD-10-CM | POA: Insufficient documentation

## 2012-08-28 DIAGNOSIS — I428 Other cardiomyopathies: Secondary | ICD-10-CM | POA: Insufficient documentation

## 2012-08-28 DIAGNOSIS — Z9119 Patient's noncompliance with other medical treatment and regimen: Secondary | ICD-10-CM | POA: Insufficient documentation

## 2012-08-28 DIAGNOSIS — E7800 Pure hypercholesterolemia, unspecified: Secondary | ICD-10-CM | POA: Insufficient documentation

## 2012-08-28 HISTORY — DX: Pure hypercholesterolemia, unspecified: E78.00

## 2012-08-28 NOTE — ED Notes (Addendum)
Pt reports upon waking approx 0900 this a.m. She had substantial dizziness and was having difficulties getting out of bed, once dizziness subsided pt states onset of migraine occurred w/ nausea and dry heaves. Pt also w/ rt 1st, 2nd, and 3rd digit numbness x1 week. Pt w/ shortness of breath as well however admits to hx of CHF and is seen by Dr. Sharyn Lull for this.

## 2012-08-29 LAB — CBC WITH DIFFERENTIAL/PLATELET
Hemoglobin: 12 g/dL (ref 12.0–15.0)
Lymphs Abs: 2.9 10*3/uL (ref 0.7–4.0)
Monocytes Relative: 5 % (ref 3–12)
Neutro Abs: 4.9 10*3/uL (ref 1.7–7.7)
Neutrophils Relative %: 59 % (ref 43–77)
Platelets: 234 10*3/uL (ref 150–400)
RBC: 4.25 MIL/uL (ref 3.87–5.11)
WBC: 8.3 10*3/uL (ref 4.0–10.5)

## 2012-08-29 LAB — BASIC METABOLIC PANEL
BUN: 17 mg/dL (ref 6–23)
Chloride: 100 mEq/L (ref 96–112)
GFR calc Af Amer: >90 mL/min (ref >90)
Glucose, Bld: 110 mg/dL — ABNORMAL HIGH (ref 70–99)
Potassium: 3.4 mEq/L — ABNORMAL LOW (ref 3.5–5.1)
Sodium: 139 mEq/L (ref 135–145)

## 2012-08-29 MED ORDER — KETOROLAC TROMETHAMINE 30 MG/ML IJ SOLN
30.0000 mg | Freq: Once | INTRAMUSCULAR | Status: AC
  Administered 2012-08-29: 30 mg via INTRAVENOUS
  Filled 2012-08-29: qty 1

## 2012-08-29 MED ORDER — DIPHENHYDRAMINE HCL 50 MG/ML IJ SOLN
12.5000 mg | Freq: Once | INTRAMUSCULAR | Status: AC
  Administered 2012-08-29: 12.5 mg via INTRAVENOUS
  Filled 2012-08-29: qty 1

## 2012-08-29 MED ORDER — METOCLOPRAMIDE HCL 5 MG/ML IJ SOLN
10.0000 mg | Freq: Once | INTRAMUSCULAR | Status: AC
  Administered 2012-08-29: 10 mg via INTRAVENOUS
  Filled 2012-08-29: qty 2

## 2012-08-29 MED ORDER — SODIUM CHLORIDE 0.9 % IV BOLUS (SEPSIS)
250.0000 mL | Freq: Once | INTRAVENOUS | Status: AC
  Administered 2012-08-29: 250 mL via INTRAVENOUS

## 2012-08-29 NOTE — ED Provider Notes (Signed)
Medical screening examination/treatment/procedure(s) were performed by non-physician practitioner and as supervising physician I was immediately available for consultation/collaboration.  Jailen Coward M Somer Trotter, MD 08/29/12 0420 

## 2012-08-29 NOTE — ED Provider Notes (Signed)
History     CSN: [id number]  Arrival date & time 08/28/12  2210   First MD Initiated Contact with Patient 08/29/12 0033      Chief Complaint  Patient presents with  . Migraine    (Consider location/radiation/quality/duration/timing/severity/associated sxs/prior treatment) HPI Comments: Patient states she's had a migraine headache for over a week.  It's global in nature, causing nausea, some blurry vision.  She tried over-the-counter ibuprofen, and Tylenol without any relief.  She states she recently had her antihypertensive adjusted by her primary care Dr. because her pressure was too high.  She also reports, that she's had approximately a week, and half, of numbness and tingling to her right thumb, first and second finger, without any known injury, persistent in nature.  She does report bilateral neck pain, left greater than right.  Does not have a history carpal tunnel syndrome  Patient is a 51 y.o. female presenting with migraines. The history is provided by the patient.  Migraine This is a new problem. The current episode started in the past 7 days. The problem occurs constantly. The problem has been unchanged. Associated symptoms include headaches, nausea, numbness and a visual change. Pertinent negatives include no chills, fever, rash, vomiting or weakness.    Past Medical History  Diagnosis Date  . Bronchitis   . Nonischemic cardiomyopathy   . Chronic systolic dysfunction of left ventricle   . Hypertension   . LVH (left ventricular hypertrophy)   . Tobacco abuse disorder   . Polysubstance abuse   . History of noncompliance with medical treatment   . Hypercholesteremia     Past Surgical History  Procedure Date  . Abdominal hysterectomy   . Abdominal surgery   . Uterine fibroid surgery     History reviewed. No pertinent family history.  History  Substance Use Topics  . Smoking status: Former Smoker -- 0.5 packs/day for 30 years    Quit date: 07/02/2012  .  Smokeless tobacco: Never Used   Comment: she is not ready to quit  . Alcohol Use: No     only one drink in past 2 months    OB History    Grav Para Term Preterm Abortions TAB SAB Ect Mult Living                  Review of Systems  Constitutional: Negative for fever and chills.  Eyes: Positive for visual disturbance.  Gastrointestinal: Positive for nausea. Negative for vomiting.  Skin: Negative for rash and wound.  Neurological: Positive for numbness and headaches. Negative for dizziness and weakness.    Allergies  Codeine  Home Medications   Current Outpatient Rx  Name Route Sig Dispense Refill  . ASPIRIN 81 MG PO TBEC Oral Take 81 mg by mouth daily. Swallow whole.    Marland Kitchen CARVEDILOL 25 MG PO TABS Oral Take 25 mg by mouth daily.    Marland Kitchen FOLIC ACID 1 MG PO TABS Oral Take 1 mg by mouth daily.    . FUROSEMIDE 40 MG PO TABS Oral Take 1 tablet (40 mg total) by mouth daily. 30 tablet 0  . LISINOPRIL 10 MG PO TABS Oral Take 10 mg by mouth 2 (two) times daily.    . ADULT MULTIVITAMIN W/MINERALS CH Oral Take 1 tablet by mouth daily.    Marland Kitchen PRAVASTATIN SODIUM 40 MG PO TABS Oral Take 1 tablet (40 mg total) by mouth daily. 30 tablet 0  . SPIRONOLACTONE 25 MG PO TABS Oral Take 1 tablet (25  mg total) by mouth daily. 30 tablet 0  . THIAMINE HCL 100 MG PO TABS Oral Take 100 mg by mouth daily.      BP 139/92  Pulse 76  Temp 98.5 F (36.9 C) (Oral)  Resp 14  SpO2 100%  Physical Exam  Constitutional: She is oriented to person, place, and time. She appears well-developed and well-nourished.  HENT:  Head: Normocephalic.  Neck: Normal range of motion.  Cardiovascular: Normal rate.   Pulmonary/Chest: Effort normal.  Musculoskeletal: Normal range of motion. She exhibits no edema and no tenderness.  Neurological: She is alert and oriented to person, place, and time. No cranial nerve deficit.  Skin: Skin is warm. No rash noted. No erythema.    ED Course  Procedures (including critical care  time)  Labs Reviewed  CBC WITH DIFFERENTIAL - Abnormal; Notable for the following:    HCT 35.5 (*)     All other components within normal limits  BASIC METABOLIC PANEL - Abnormal; Notable for the following:    Potassium 3.4 (*)     Glucose, Bld 110 (*)     All other components within normal limits  GLUCOSE, CAPILLARY - Abnormal; Notable for the following:    Glucose-Capillary 111 (*)     All other components within normal limits   No results found.   1. Migraine headache       MDM   Will treat headache with IV fluids, IV, Toradol, IV, Benadryl, and IV, Reglan, and reassess as far as numbness and tingling in her right thumb, index, and middle finger shows full range of motion.  Temperature is equal.  Refill is less than 3 seconds.  She does not have a positive Tinel sign.  We'll reassess this after resolution of headache Patient's headaches.  Significantly improved, we'll discharge home with a resource list for followup       Arman Filter, NP 08/29/12 0400  Arman Filter, NP 08/29/12 0400

## 2012-08-29 NOTE — ED Notes (Signed)
Bed:WA11<BR> Expected date:<BR> Expected time:<BR> Means of arrival:<BR> Comments:<BR>

## 2012-08-29 NOTE — ED Notes (Signed)
Pt reports numbness in her fingers.

## 2012-08-29 NOTE — ED Notes (Signed)
Pt states that her headache pain is better and feels she is able to go home.  Dondra Spry, NP made aware.

## 2012-09-22 ENCOUNTER — Emergency Department (HOSPITAL_COMMUNITY): Payer: No Typology Code available for payment source

## 2012-09-22 ENCOUNTER — Encounter (HOSPITAL_COMMUNITY): Payer: Self-pay | Admitting: *Deleted

## 2012-09-22 ENCOUNTER — Emergency Department (HOSPITAL_COMMUNITY)
Admission: EM | Admit: 2012-09-22 | Discharge: 2012-09-22 | Disposition: A | Discharge status: Home / Self Care | Payer: No Typology Code available for payment source | Attending: Emergency Medicine | Admitting: Emergency Medicine

## 2012-09-22 DIAGNOSIS — I509 Heart failure, unspecified: Secondary | ICD-10-CM | POA: Insufficient documentation

## 2012-09-22 DIAGNOSIS — R079 Chest pain, unspecified: Secondary | ICD-10-CM | POA: Insufficient documentation

## 2012-09-22 DIAGNOSIS — I10 Essential (primary) hypertension: Secondary | ICD-10-CM | POA: Insufficient documentation

## 2012-09-22 DIAGNOSIS — S20219A Contusion of unspecified front wall of thorax, initial encounter: Secondary | ICD-10-CM

## 2012-09-22 DIAGNOSIS — E7800 Pure hypercholesterolemia, unspecified: Secondary | ICD-10-CM | POA: Insufficient documentation

## 2012-09-22 DIAGNOSIS — Z87891 Personal history of nicotine dependence: Secondary | ICD-10-CM | POA: Insufficient documentation

## 2012-09-22 DIAGNOSIS — Z79899 Other long term (current) drug therapy: Secondary | ICD-10-CM | POA: Insufficient documentation

## 2012-09-22 DIAGNOSIS — Z7982 Long term (current) use of aspirin: Secondary | ICD-10-CM | POA: Insufficient documentation

## 2012-09-22 DIAGNOSIS — Y929 Unspecified place or not applicable: Secondary | ICD-10-CM | POA: Insufficient documentation

## 2012-09-22 DIAGNOSIS — Y9389 Activity, other specified: Secondary | ICD-10-CM | POA: Insufficient documentation

## 2012-09-22 DIAGNOSIS — I519 Heart disease, unspecified: Secondary | ICD-10-CM | POA: Insufficient documentation

## 2012-09-22 DIAGNOSIS — R0602 Shortness of breath: Secondary | ICD-10-CM | POA: Insufficient documentation

## 2012-09-22 DIAGNOSIS — M542 Cervicalgia: Secondary | ICD-10-CM | POA: Insufficient documentation

## 2012-09-22 DIAGNOSIS — I517 Cardiomegaly: Secondary | ICD-10-CM | POA: Insufficient documentation

## 2012-09-22 DIAGNOSIS — F1910 Other psychoactive substance abuse, uncomplicated: Secondary | ICD-10-CM | POA: Insufficient documentation

## 2012-09-22 MED ORDER — ONDANSETRON HCL 4 MG/2ML IJ SOLN
4.0000 mg | Freq: Once | INTRAMUSCULAR | Status: AC
  Administered 2012-09-22: 4 mg via INTRAVENOUS
  Filled 2012-09-22: qty 2

## 2012-09-22 MED ORDER — MORPHINE SULFATE 4 MG/ML IJ SOLN
4.0000 mg | Freq: Once | INTRAMUSCULAR | Status: AC
  Administered 2012-09-22: 4 mg via INTRAVENOUS
  Filled 2012-09-22: qty 1

## 2012-09-22 MED ORDER — OXYCODONE-ACETAMINOPHEN 5-325 MG PO TABS: 1.0000 | ORAL_TABLET | Freq: Four times a day (QID) | ORAL | Status: DC | PRN

## 2012-09-22 MED ORDER — CYCLOBENZAPRINE HCL 10 MG PO TABS: 10.0000 mg | ORAL_TABLET | Freq: Two times a day (BID) | ORAL | Status: DC | PRN

## 2012-09-22 NOTE — ED Notes (Signed)
Pt taken off LSB with ED MD presence. C-collar remains in place

## 2012-09-22 NOTE — ED Notes (Signed)
NAD noted at time of d/c home with friend 

## 2012-09-22 NOTE — ED Notes (Signed)
Instructed patient to take her medications as directed.
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
## 2012-09-22 NOTE — ED Notes (Signed)
Patient transported to CT 
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
## 2013-01-20 IMAGING — CT CT ANGIO CHEST
1 of 2 series · 19 of 32 positions shown · IV contrast (100 ML OMNI 300)
Comparison: 11/15/2010and 11/23/2011

CLINICAL DATA: Pleuritic chest pain.  Chest tightness and shortness
of breath.  Symptoms became worse overnight.  Blood tinged cough.

CT ANGIOGRAPHY CHEST
TECHNIQUE: Multidetector CT imaging of the chest using the
standard protocol during bolus administration of intravenous
contrast. Multiplanar reconstructed images including MIPs were
obtained and reviewed to evaluate the vascular anatomy.
Contrast: 100mL OMNIPAQUE IOHEXOL 300 MG/ML IV SOLN

[Series 11: thins for pacs · axial · 0.74mm/px · z∈[-207,-10]mm · 19 of 219 slices shown]
[im 11/219  lung]
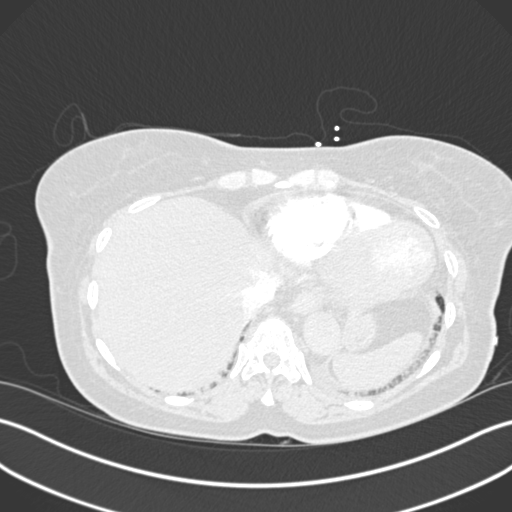
[im 22/219  mediastinal]
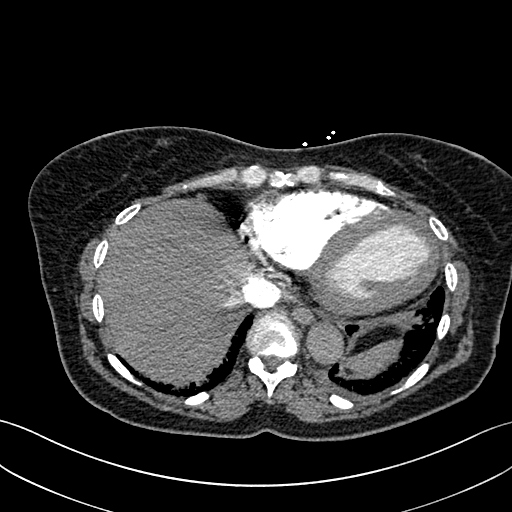
[im 33/219  lung]
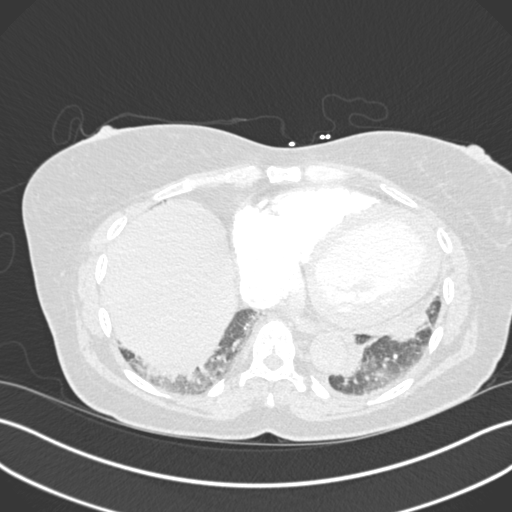
[im 55/219  mediastinal]
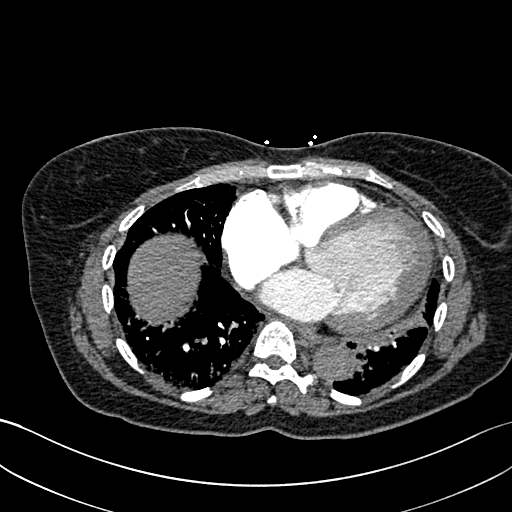
[im 66/219  lung]
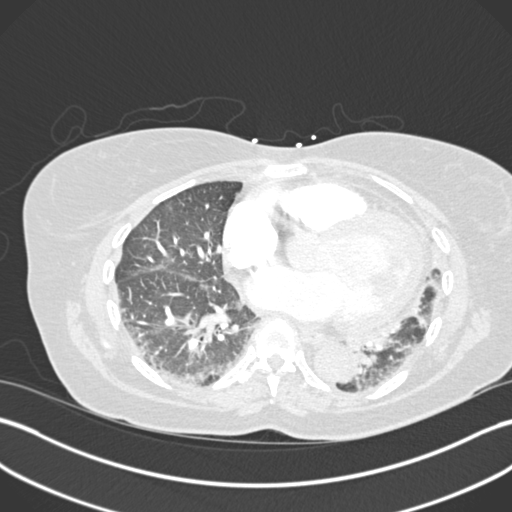
[im 73/219  mediastinal]
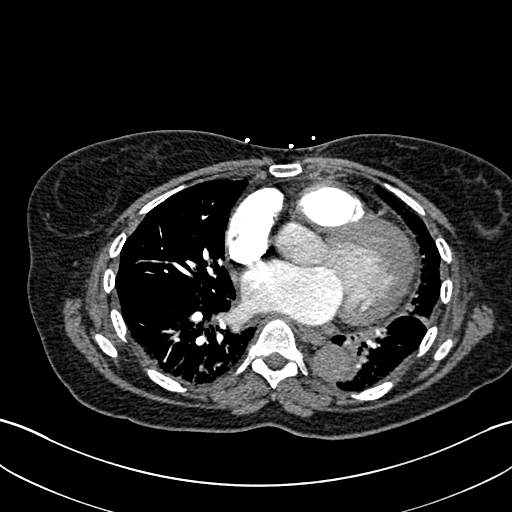
[im 77/219  lung]
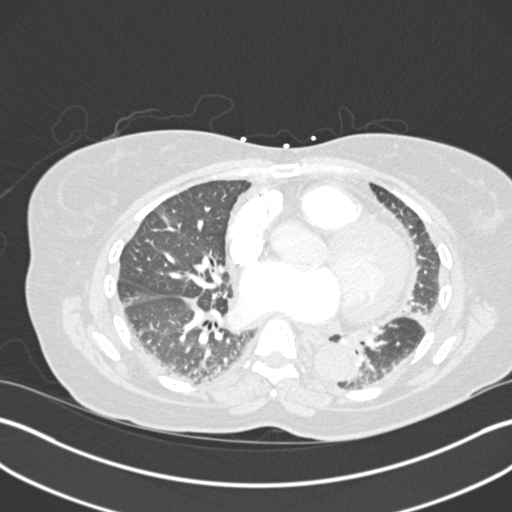
[im 88/219  mediastinal]
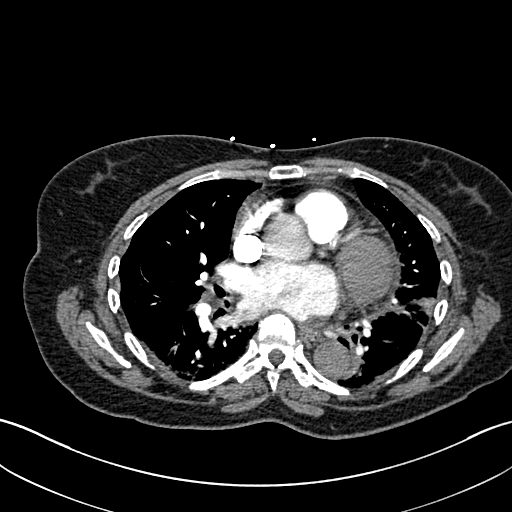
[im 99/219  lung]
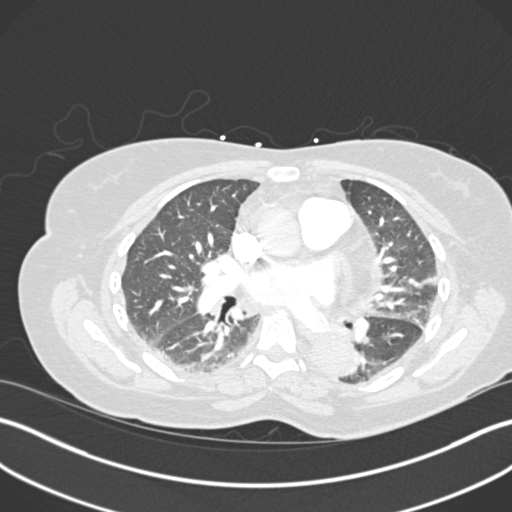
[im 110/219  mediastinal]
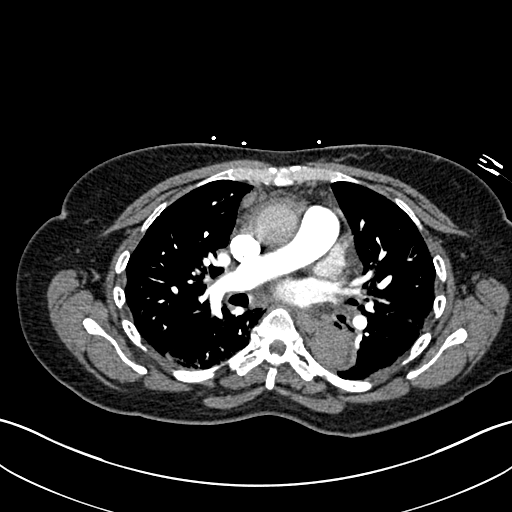
[im 120/219  lung]
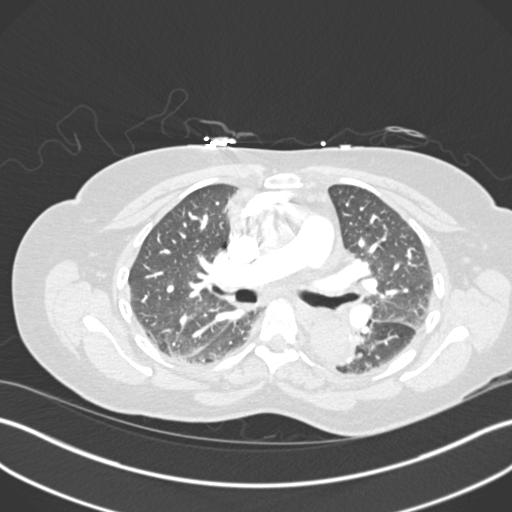
[im 131/219  mediastinal]
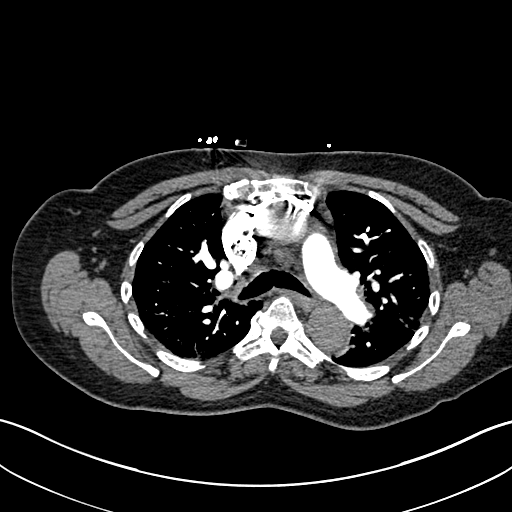
[im 142/219  lung]
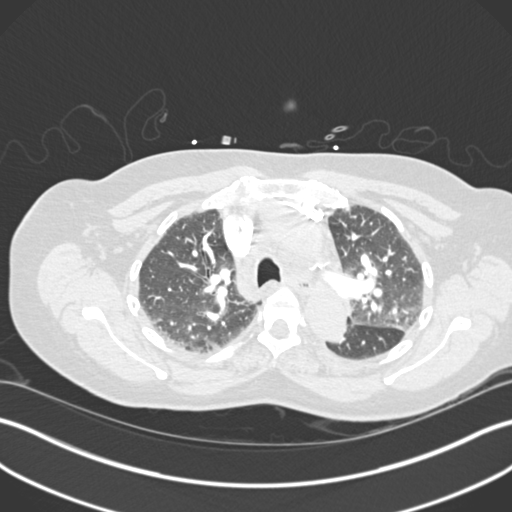
[im 146/219  mediastinal]
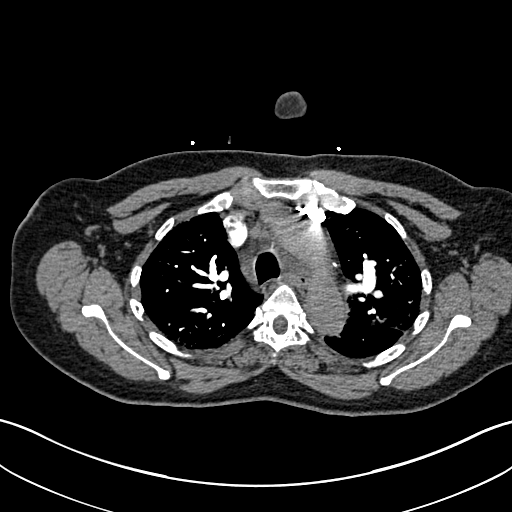
[im 153/219  lung]
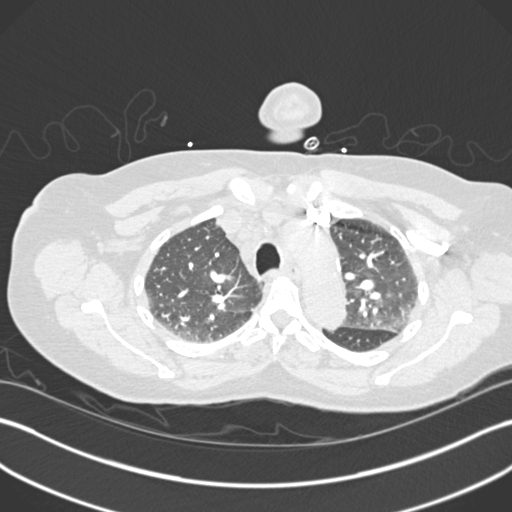
[im 164/219  mediastinal]
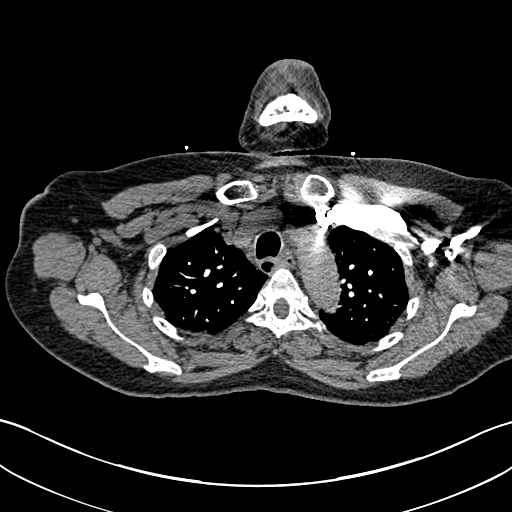
[im 186/219  lung]
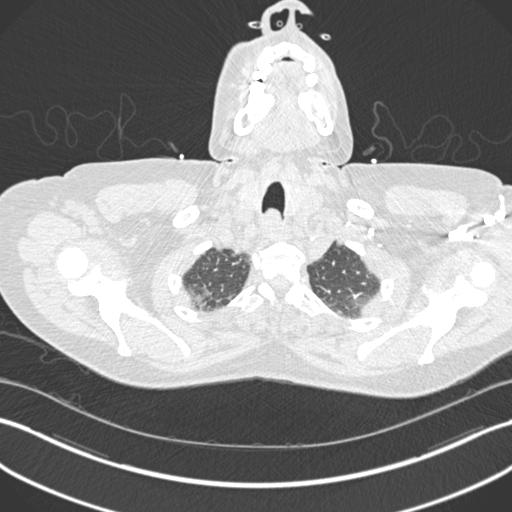
[im 197/219  mediastinal]
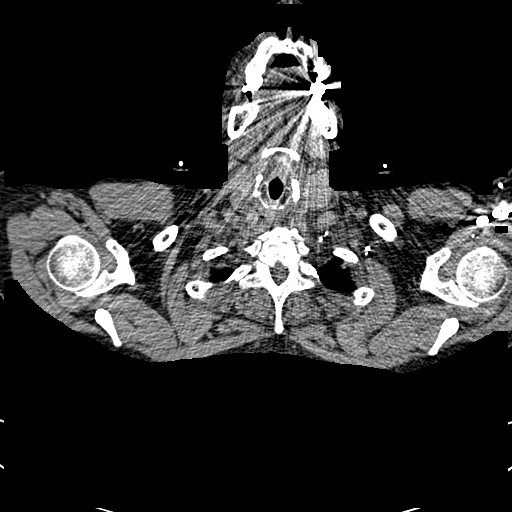
[im 208/219  lung]
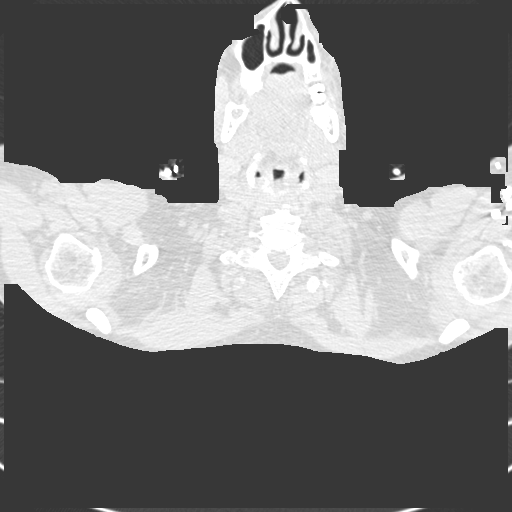

[19 of 32 positions shown; findings below may reference images not displayed]

FINDINGS: Pulmonary arteries are well opacified.  There is no
evidence for acute pulmonary embolus.  The heart is enlarged.
There are dependent changes consistent with pulmonary edema.  No
focal consolidations or pleural effusions are identified.

Images of the upper abdomen are unremarkable.  Mild thoracic
degenerative changes are present.
IMPRESSION: 1.  Technically adequate exam showing no evidence for acute
pulmonary embolus.
2.  Cardiomegaly and mild changes of pulmonary edema.

## 2013-01-20 IMAGING — CR DG CHEST 2V
2 series · 2 of 2 positions shown · non-contrast
Comparison: Chest x-ray of 11/15/2010

CLINICAL DATA: Cough, chest pain, smoking history

CHEST - 2 VIEW

[x chest ap]
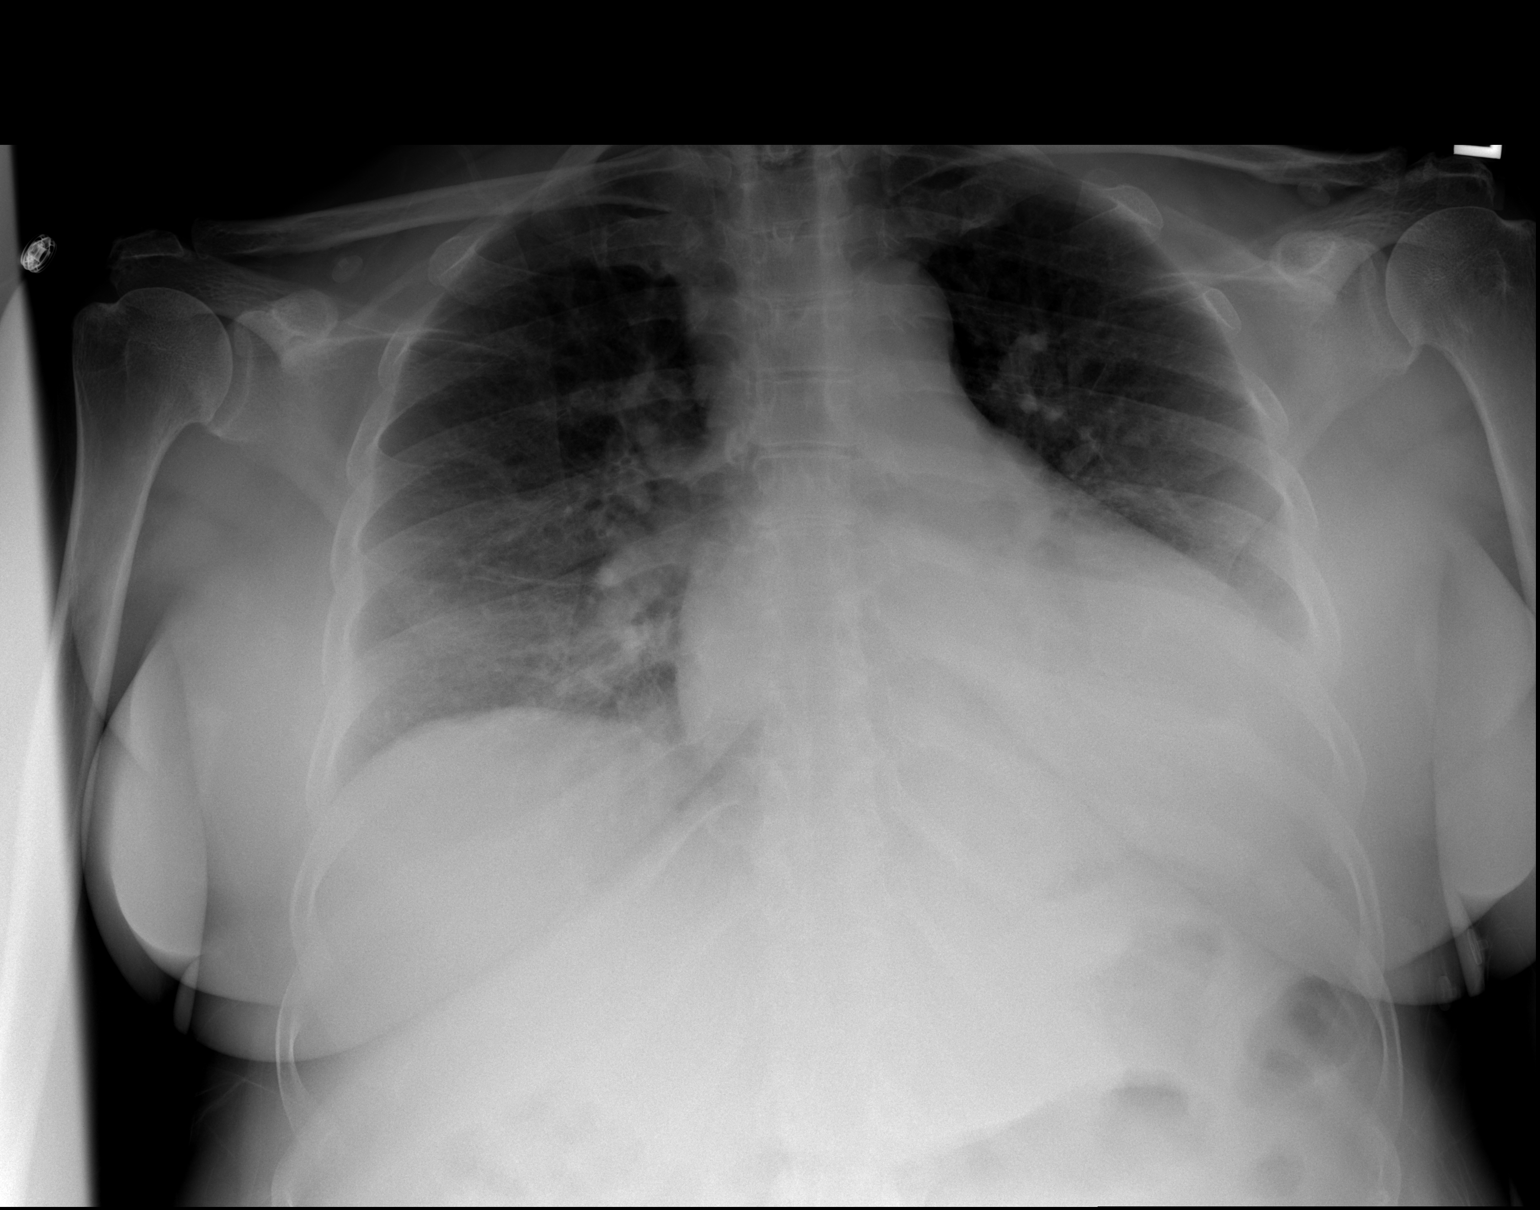

[w chest lat]
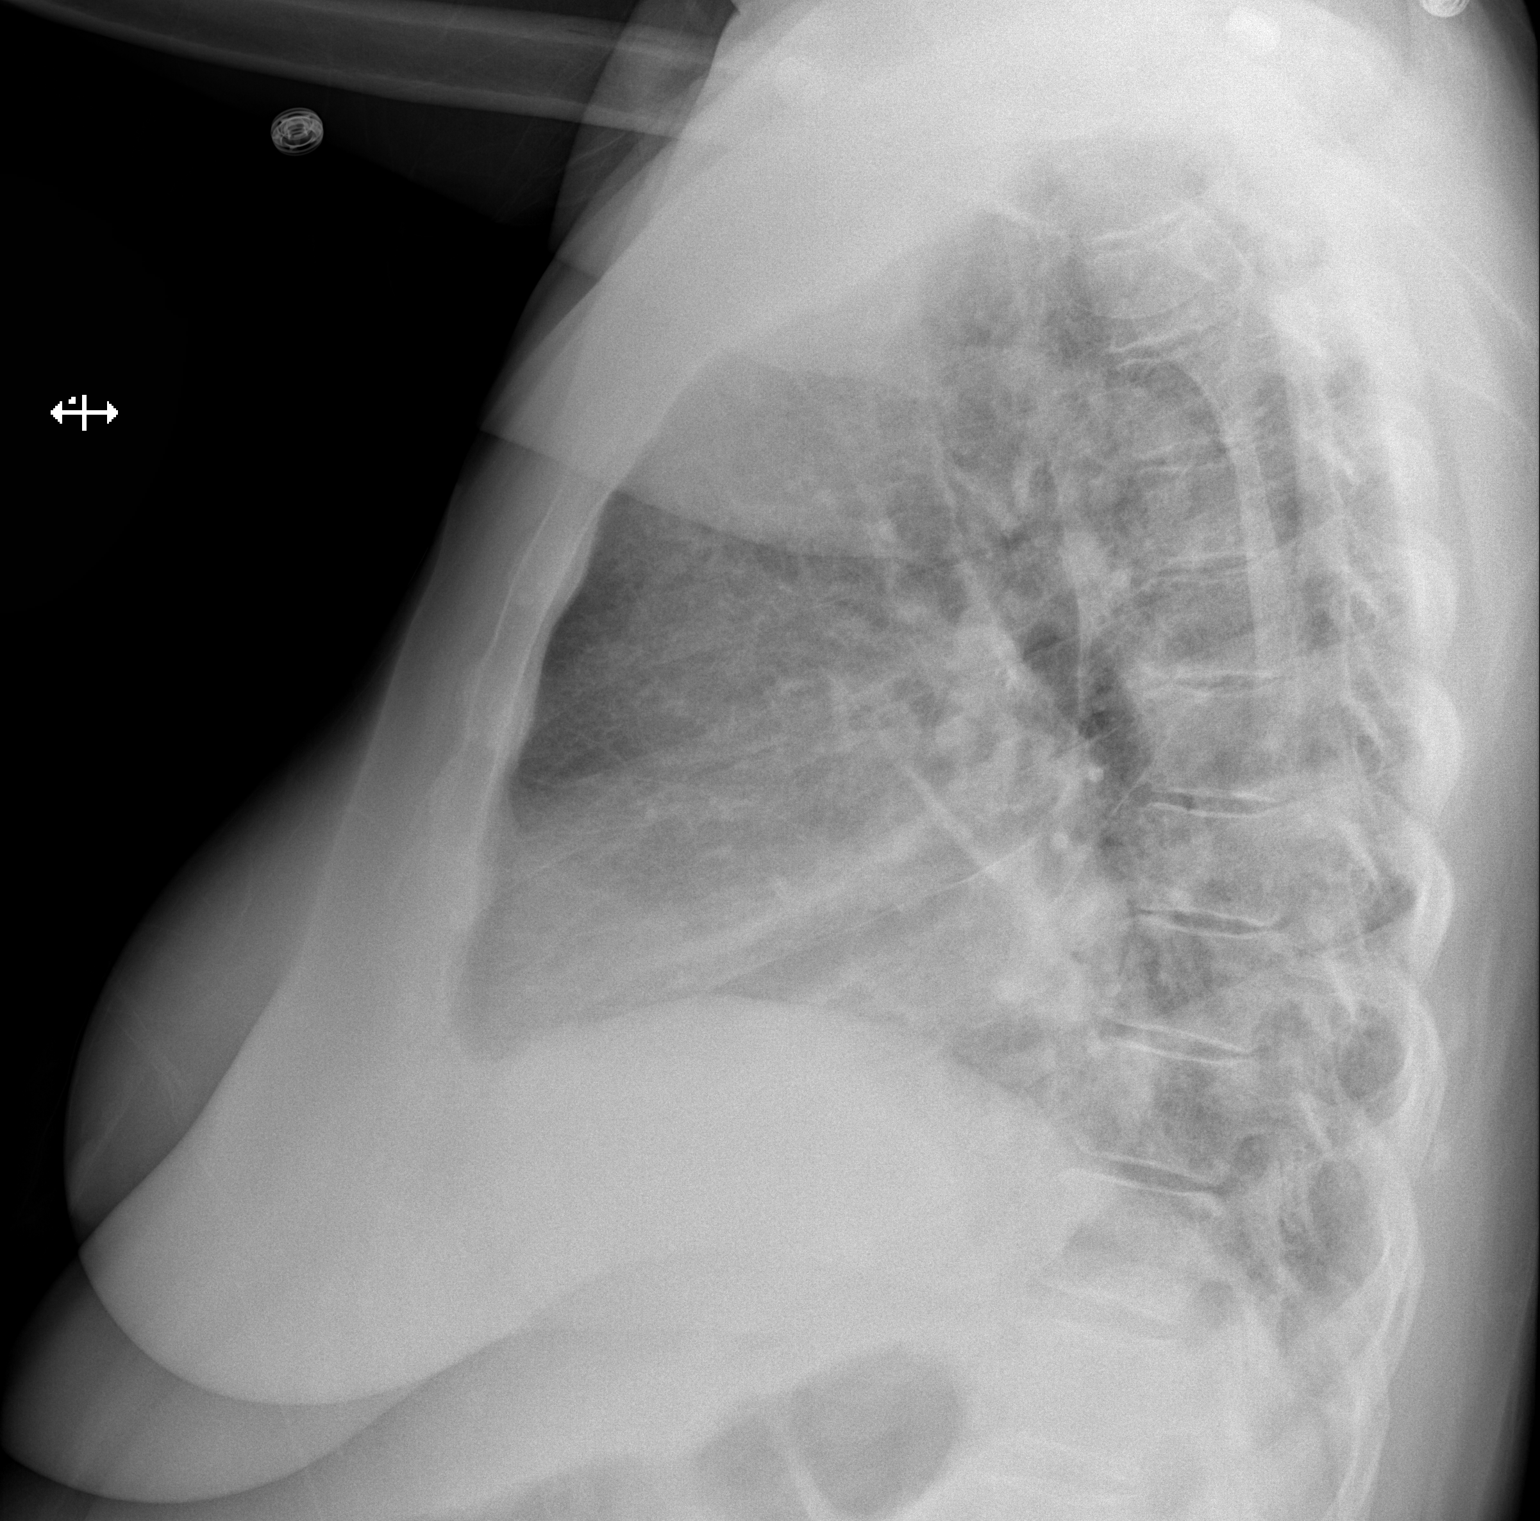

[2 of 2 positions shown; findings below may reference images not displayed]

FINDINGS: The lungs are not as well aerated with some basilar
volume loss present. There is some more opacity at the left lung
base and developing pneumonia cannot be excluded.  Mild
cardiomegaly is stable. No bony abnormality is seen.
IMPRESSION: Stable cardiomegaly.  More opacity at the left lung base.  Cannot
exclude developing pneumonia.

## 2013-04-17 NOTE — Progress Notes (Signed)
Reviewed case with dr crews-chronic chf with ef 20%-too high risk here-needs to be done main or-dr youngs office notified

## 2013-04-20 ENCOUNTER — Ambulatory Visit (HOSPITAL_BASED_OUTPATIENT_CLINIC_OR_DEPARTMENT_OTHER): Admit: 2013-04-20 | Payer: Self-pay | Admitting: Ophthalmology

## 2013-04-20 ENCOUNTER — Encounter (HOSPITAL_BASED_OUTPATIENT_CLINIC_OR_DEPARTMENT_OTHER): Payer: Self-pay

## 2013-04-20 SURGERY — REPAIR STRABISMUS
Anesthesia: General | Laterality: Left
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
## 2013-07-23 ENCOUNTER — Encounter (HOSPITAL_COMMUNITY): Payer: Self-pay | Admitting: Pharmacy Technician

## 2013-07-24 ENCOUNTER — Encounter (HOSPITAL_COMMUNITY)
Admission: RE | Admit: 2013-07-24 | Discharge: 2013-07-24 | Disposition: A | Discharge status: Home / Self Care | Payer: Medicare Other | Source: Ambulatory Visit | Attending: Ophthalmology | Admitting: Ophthalmology

## 2013-07-24 ENCOUNTER — Encounter (HOSPITAL_COMMUNITY): Payer: Self-pay

## 2013-07-24 DIAGNOSIS — Z01818 Encounter for other preprocedural examination: Secondary | ICD-10-CM | POA: Insufficient documentation

## 2013-07-24 DIAGNOSIS — Z01812 Encounter for preprocedural laboratory examination: Secondary | ICD-10-CM | POA: Insufficient documentation

## 2013-07-24 HISTORY — DX: Unspecified osteoarthritis, unspecified site: M19.90

## 2013-07-24 HISTORY — DX: Gastro-esophageal reflux disease without esophagitis: K21.9

## 2013-07-24 HISTORY — DX: Unspecified intestinal obstruction, unspecified as to partial versus complete obstruction: K56.609

## 2013-07-24 HISTORY — DX: Headache: R51

## 2013-07-24 HISTORY — DX: Type 2 diabetes mellitus without complications: E11.9

## 2013-07-24 LAB — CBC
HCT: 31.6 % — ABNORMAL LOW (ref 36.0–46.0)
Hemoglobin: 10.6 g/dL — ABNORMAL LOW (ref 12.0–15.0)
MCH: 28 pg (ref 26.0–34.0)
MCHC: 33.5 g/dL (ref 30.0–36.0)

## 2013-07-24 LAB — BASIC METABOLIC PANEL
BUN: 19 mg/dL (ref 6–23)
Calcium: 10.3 mg/dL (ref 8.4–10.5)
GFR calc non Af Amer: 54 mL/min — ABNORMAL LOW (ref >90)
Glucose, Bld: 115 mg/dL — ABNORMAL HIGH (ref 70–99)

## 2013-07-24 NOTE — Pre-Procedure Instructions (Signed)
[name]  07/24/2013   Your procedure is scheduled on:  July 31, 2013 at 7:30 AM  Report to [facility] Main Entrance "A" at 5:30 AM.  Call this number if you have problems the morning of surgery: [phone]   Remember:   Do not eat food or drink liquids after midnight Monday 07/30/13   Take these medicines the morning of surgery with A SIP OF WATER: carvedilol (COREG), omeprazole (PRILOSEC), Flexeril - if needed. Stop all Vitamins, Herbal Medications, Aspirin and NSAIDS (Motrin, Ibuprofen, Aleve, Naproxen, etc) as of today 07/24/13.          Do not wear jewelry, make-up or nail polish.  Do not wear lotions, powders, or perfumes. You may wear deodorant.  Do not shave 48 hours prior to surgery.   Do not bring valuables to the hospital.  [facility] is not responsible                   for any belongings or valuables.  Contacts, dentures or bridgework may not be worn into surgery.  Leave suitcase in the car. After surgery it may be brought to your room.  For patients admitted to the hospital, checkout time is 11:00 AM the day of  discharge.   Patients discharged the day of surgery will not be allowed to drive  home.  Name and phone number of your driver: Family/Friend  Special Instructions: Shower using CHG 2 nights before surgery and the night before surgery.  If you shower the day of surgery use CHG.  Use special wash - you have one bottle of CHG for all showers.  You should use approximately 1/3 of the bottle for each shower.   Please read over the following fact sheets that you were given: Pain Booklet, Coughing and Deep Breathing and Surgical Site Infection Prevention

## 2013-07-25 NOTE — Progress Notes (Signed)
Anesthesia Chart Review:  Patient is a 52 year old female scheduled for repair of left eye strabismus on 07/31/13 by Dr. Maple Hudson.  Case was initially scheduled for [facility], but anesthesiologist Dr. Ivin Booty requested it be moved to [facility] Main OR due to her history of non-ischemic CM/chronic systolic CHF with LV dysfunction.  History includes former smoker, CHF, non-ischemic cardiomyopathy, HTN,  hypercholesterolemia, GERD, DM2, arthritis, polysubstance abuse including cocaine and ETOH (reportedly non longer using illicit drugs), bowel obstruction, hysterectomy. PCP is Dr. Dorothyann Peng. Cardiologist is Dr. Sharyn Lull, last visit 07/23/13.  EKG on 04/11/13 showed SR, LAE, LVH, non-specific ST/T wave abnormality.  T wave abnormality is less prominent then when compared to EKG from 08/28/12.  Echo on 12/29/12 (Dr. Sharyn Lull) showed mild concentric LVH, normal LV size, moderately depressed LV systolic function, EF 35-39%, overall wall motion is hypokinetic.  Trace TR. (EF was up from 25-30% on 11/24/11.)  Nuclear stress test on 11/18/10 showed: 1. No reversible ischemia or infarction. 2. Global hypokinesia and left ventricular dilatation. 3. Left ventricular ejection fraction equal 20%.  CXR on 11/23/11 showed mild cardiomegaly, clear lungs, no CHF, no acute deformity identified.  Preoperative labs noted.  She has a history of non-ischemic CM with echo this year showing EF up to 35-39% from 25-30%. She remains on B-blocker, ACEI, and diuretic therapy.  She had recent cardiology follow-up.  If no acute CV/CHF symptoms then I would anticipate that she could proceed as planned.  Velna Ochs [facility] Short Stay Center/Anesthesiology Phone [phone] 07/25/2013 10:44 AM

## 2013-07-31 ENCOUNTER — Ambulatory Visit (HOSPITAL_COMMUNITY)
Admission: RE | Admit: 2013-07-31 | Discharge: 2013-07-31 | Disposition: A | Discharge status: Home / Self Care | Payer: Medicare Other | Source: Ambulatory Visit | Attending: Ophthalmology | Admitting: Ophthalmology

## 2013-07-31 ENCOUNTER — Ambulatory Visit (HOSPITAL_COMMUNITY): Payer: Medicare Other | Admitting: Anesthesiology

## 2013-07-31 ENCOUNTER — Encounter (HOSPITAL_COMMUNITY)
Admission: RE | Disposition: A | Discharge status: Home / Self Care | Payer: Self-pay | Source: Ambulatory Visit | Attending: Ophthalmology

## 2013-07-31 ENCOUNTER — Encounter (HOSPITAL_COMMUNITY): Payer: Self-pay | Admitting: Anesthesiology

## 2013-07-31 ENCOUNTER — Encounter (HOSPITAL_COMMUNITY): Payer: Self-pay | Admitting: Vascular Surgery

## 2013-07-31 DIAGNOSIS — E7800 Pure hypercholesterolemia, unspecified: Secondary | ICD-10-CM | POA: Insufficient documentation

## 2013-07-31 DIAGNOSIS — I509 Heart failure, unspecified: Secondary | ICD-10-CM | POA: Insufficient documentation

## 2013-07-31 DIAGNOSIS — H5010 Unspecified exotropia: Secondary | ICD-10-CM | POA: Insufficient documentation

## 2013-07-31 DIAGNOSIS — F17200 Nicotine dependence, unspecified, uncomplicated: Secondary | ICD-10-CM | POA: Insufficient documentation

## 2013-07-31 DIAGNOSIS — I10 Essential (primary) hypertension: Secondary | ICD-10-CM | POA: Insufficient documentation

## 2013-07-31 DIAGNOSIS — I428 Other cardiomyopathies: Secondary | ICD-10-CM | POA: Insufficient documentation

## 2013-07-31 HISTORY — PX: STRABISMUS SURGERY: SHX218

## 2013-07-31 LAB — GLUCOSE, CAPILLARY
Glucose-Capillary: 104 mg/dL — ABNORMAL HIGH (ref 70–99)
Glucose-Capillary: 130 mg/dL — ABNORMAL HIGH (ref 70–99)

## 2013-07-31 SURGERY — REPAIR STRABISMUS
Anesthesia: General | Site: Eye | Laterality: Left | Wound class: Clean

## 2013-07-31 MED ORDER — TOBRAMYCIN-DEXAMETHASONE 0.3-0.1 % OP OINT: TOPICAL_OINTMENT | Freq: Two times a day (BID) | OPHTHALMIC | Status: DC

## 2013-07-31 MED ORDER — ONDANSETRON HCL 4 MG/2ML IJ SOLN
INTRAMUSCULAR | Status: DC | PRN
  Administered 2013-07-31 (×2): 4 mg via INTRAVENOUS

## 2013-07-31 MED ORDER — BSS IO SOLN
INTRAOCULAR | Status: DC | PRN
  Administered 2013-07-31: 15 mL via INTRAOCULAR

## 2013-07-31 MED ORDER — TOBRAMYCIN 0.3 % OP OINT
TOPICAL_OINTMENT | OPHTHALMIC | Status: DC | PRN
  Administered 2013-07-31: 1 via OPHTHALMIC

## 2013-07-31 MED ORDER — TOBRAMYCIN-DEXAMETHASONE 0.3-0.1 % OP OINT
TOPICAL_OINTMENT | OPHTHALMIC | Status: AC
  Filled 2013-07-31: qty 3.5

## 2013-07-31 MED ORDER — CARVEDILOL 25 MG PO TABS
25.0000 mg | ORAL_TABLET | Freq: Two times a day (BID) | ORAL | Status: AC
  Administered 2013-07-31: 25 mg via ORAL
  Filled 2013-07-31 (×2): qty 1

## 2013-07-31 MED ORDER — PHENYLEPHRINE HCL 10 MG/ML IJ SOLN
INTRAMUSCULAR | Status: DC | PRN
  Administered 2013-07-31: 160 ug via INTRAVENOUS
  Administered 2013-07-31: 80 ug via INTRAVENOUS

## 2013-07-31 MED ORDER — BSS IO SOLN
INTRAOCULAR | Status: AC
  Filled 2013-07-31: qty 15

## 2013-07-31 MED ORDER — 0.9 % SODIUM CHLORIDE (POUR BTL) OPTIME
TOPICAL | Status: DC | PRN
  Administered 2013-07-31: 1000 mL

## 2013-07-31 MED ORDER — PROPOFOL 10 MG/ML IV BOLUS
INTRAVENOUS | Status: DC | PRN
  Administered 2013-07-31: 200 mg via INTRAVENOUS

## 2013-07-31 MED ORDER — TETRACAINE HCL 0.5 % OP SOLN
OPHTHALMIC | Status: AC
  Filled 2013-07-31: qty 2

## 2013-07-31 MED ORDER — MIDAZOLAM HCL 5 MG/5ML IJ SOLN
INTRAMUSCULAR | Status: DC | PRN
  Administered 2013-07-31: 2 mg via INTRAVENOUS

## 2013-07-31 MED ORDER — OXYCODONE HCL 5 MG/5ML PO SOLN: 5.0000 mg | Freq: Once | ORAL | Status: DC | PRN

## 2013-07-31 MED ORDER — CARVEDILOL 12.5 MG PO TABS
ORAL_TABLET | ORAL | Status: AC
  Filled 2013-07-31: qty 2

## 2013-07-31 MED ORDER — HYDROMORPHONE HCL PF 1 MG/ML IJ SOLN
0.2500 mg | INTRAMUSCULAR | Status: DC | PRN
  Administered 2013-07-31 (×2): 0.5 mg via INTRAVENOUS

## 2013-07-31 MED ORDER — LIDOCAINE HCL (CARDIAC) 20 MG/ML IV SOLN
INTRAVENOUS | Status: DC | PRN
  Administered 2013-07-31: 100 mg via INTRAVENOUS

## 2013-07-31 MED ORDER — OXYCODONE HCL 5 MG PO TABS: 5.0000 mg | ORAL_TABLET | Freq: Once | ORAL | Status: DC | PRN

## 2013-07-31 MED ORDER — PHENYLEPHRINE HCL 2.5 % OP SOLN
OPHTHALMIC | Status: AC
  Filled 2013-07-31: qty 3

## 2013-07-31 MED ORDER — LIDOCAINE-EPINEPHRINE 2 %-1:100000 IJ SOLN
INTRAMUSCULAR | Status: AC
  Filled 2013-07-31: qty 1

## 2013-07-31 MED ORDER — ONDANSETRON HCL 4 MG/2ML IJ SOLN: 4.0000 mg | Freq: Once | INTRAMUSCULAR | Status: DC | PRN

## 2013-07-31 MED ORDER — MEPERIDINE HCL 25 MG/ML IJ SOLN: 6.2500 mg | INTRAMUSCULAR | Status: DC | PRN

## 2013-07-31 MED ORDER — LACTATED RINGERS IV SOLN
INTRAVENOUS | Status: DC | PRN
  Administered 2013-07-31 (×2): via INTRAVENOUS

## 2013-07-31 MED ORDER — FENTANYL CITRATE 0.05 MG/ML IJ SOLN
INTRAMUSCULAR | Status: DC | PRN
  Administered 2013-07-31: 100 ug via INTRAVENOUS
  Administered 2013-07-31: 25 ug via INTRAVENOUS
  Administered 2013-07-31: 50 ug via INTRAVENOUS

## 2013-07-31 MED ORDER — HYDROMORPHONE HCL PF 1 MG/ML IJ SOLN
INTRAMUSCULAR | Status: AC
  Filled 2013-07-31: qty 1

## 2013-07-31 SURGICAL SUPPLY — 25 items
APL SRG 3 HI ABS STRL LF PLS (MISCELLANEOUS) ×1
APPLICATOR COTTON TIP 6IN STRL (MISCELLANEOUS) ×2 IMPLANT
APPLICATOR DR MATTHEWS STRL (MISCELLANEOUS) ×2 IMPLANT
COATED VICRYL (POLYGLACTIN 910) SUTURE VIOLET BRAIDED 6-0 (0.7 METRIC) 18" (45CM) S-28 7.6MM 1/2C SABRELOC SPATULA NEEDLE J562 ×1 IMPLANT
DRAPE ORTHO SPLIT 77X108 STRL (DRAPES)
DRAPE PROXIMA HALF (DRAPES) ×1 IMPLANT
DRAPE SURG 17X23 STRL (DRAPES) ×4 IMPLANT
DRAPE SURG ORHT 6 SPLT 77X108 (DRAPES) ×1 IMPLANT
GLOVE BIOGEL M STRL SZ7.5 (GLOVE) ×2 IMPLANT
GLOVE ECLIPSE 8.5 STRL (GLOVE) ×1 IMPLANT
GLOVE SURG SS PI 6.5 STRL IVOR (GLOVE) ×1 IMPLANT
GLOVE SURG SS PI 7.0 STRL IVOR (GLOVE) ×1 IMPLANT
GOWN STRL NON-REIN LRG LVL3 (GOWN DISPOSABLE) ×2 IMPLANT
GOWN STRL REIN XL XLG (GOWN DISPOSABLE) ×1 IMPLANT
KIT BASIN OR (CUSTOM PROCEDURE TRAY) ×2 IMPLANT
KIT ROOM TURNOVER OR (KITS) ×2 IMPLANT
NS IRRIG 1000ML POUR BTL (IV SOLUTION) ×2 IMPLANT
PACK CATARACT CUSTOM (CUSTOM PROCEDURE TRAY) ×2 IMPLANT
PAD ARMBOARD 7.5X6 YLW CONV (MISCELLANEOUS) ×4 IMPLANT
SUT PLAIN 6 0 TG1408 (SUTURE) ×1 IMPLANT
SUT SILK 4 0 C 3 735G (SUTURE) ×1 IMPLANT
SUT VICRYL ABS 6-0 S29 18IN (SUTURE) ×1 IMPLANT
TOWEL OR 17X24 6PK STRL BLUE (TOWEL DISPOSABLE) ×4 IMPLANT
WATER STERILE IRR 1000ML POUR (IV SOLUTION) ×2 IMPLANT
WIPE INSTRUMENT VISIWIPE 73X73 (MISCELLANEOUS) ×2 IMPLANT

## 2013-07-31 NOTE — Interval H&P Note (Signed)
History and Physical Interval Note:  07/31/2013 7:38 AM  [name]  has presented today for surgery, with the diagnosis of EXOTROPIA LEFT EYE  The various methods of treatment have been discussed with the patient and family. After consideration of risks, benefits and other options for treatment, the patient has consented to  Procedure(s): REPAIR STRABISMUS (Left) as a surgical intervention .  The patient's history has been reviewed, patient examined, no change in status, stable for surgery.  I have reviewed the patient's chart and labs.  Questions were answered to the patient's satisfaction.     Shara Blazing

## 2013-07-31 NOTE — Op Note (Signed)
07/31/2013  8:57 AM  PATIENT:  [name]    PRE-OPERATIVE DIAGNOSIS:  EXOTROPIA LEFT EYE  POST-OPERATIVE DIAGNOSIS:  Same  PROCEDURE:  1.  Left lateral rectus muscle recession, 10.0 mm   2.  Left medial rectus muscle resection, 7.0 mm  SURGEON:  Shara Blazing, MD  ANESTHESIA:   General  PREOPERATIVE INDICATIONS:  [name] is a  52 y.o. female with a diagnosis of EXOTROPIA LEFT EYE who failed conservative measures and elected for surgical management.    The risks benefits and alternatives were discussed with the patient preoperatively including but not limited to the risks of infection, bleeding, nerve injury, cardiopulmonary complications, the need for revision surgery, among others, and the patient was willing to proceed.    OPERATIVE PROCEDURE: The patient was taken to the operating room where she was identified by me. General anesthesia was induced without difficulty after placement of appropriate monitors. The patient was prepped and draped in standard sterile fashion. A lid speculum was placed in the left eye.  Through an inferotemporal fornix incision through conjunctiva and Tenon fascia, the left lateral rectus muscle was engaged on a series of muscle hooks and cleared of its fascial attachments. The tendon was secured with a double-armed 6-0 Vicryl suture, with a double locking bite at each border of the muscle, 1 mm from the insertion. The muscle was disinserted, and was reattached to sclera at a measured distance of 10.0 mm posterior to the original insertion, using direct scleral passes in crossed swords fashion. The suture ends were tied securely after the position of the muscle had been checked and found to be accurate. Conjunctiva was closed with 2 6-0 plain gut sutures.  Through an inferonasal fornix incision through conjunctiva and Tenon fascia, the left medial rectus muscle was engaged on a series of muscle hooks and carefully cleared of its fascial  attachments released 15 mm posterior to the insertion. The muscle was spread between 2 self-retaining hooks. A 2 mm bite was taken of the center of the muscle belly at a measured distance of 7.0 mm posterior to the insertion, and a knot was tied securely at this location. The needle at each end of the double-armed suture was passed from the center of the muscle belly to the periphery, parallel to and 7.0 mm posterior to the insertion. A double locking bite was placed at each border of the muscle. A resection clamp was placed on the muscle just anterior to the sutures. The muscle was disinserted. Each pole suture was passed posteriorly to anteriorly through the corresponding end of the muscle stump, then anteriorly to posteriorly near the center of the stump, then posteriorly to anteriorly through the center of the muscle belly, just posterior to the previously placed knot. The muscle was drawn up the level of the original insertion, and all slack was removed before the suture ends were tied securely. The clamp was removed. A portion of the muscle anterior to the sutures was carefully excised. Conjunctiva was closed with 2 6-0 plain gut sutures. TobraDex ointment was placed in the eye. The patient was awakened without difficulty and taken to the recovery room in stable condition, having suffered no intraoperative or immediate postoperative complications.

## 2013-07-31 NOTE — Progress Notes (Signed)
Report given to philip rn as caregiver 

## 2013-07-31 NOTE — Transfer of Care (Signed)
Immediate Anesthesia Transfer of Care Note  Patient: [name]  Procedure(s) Performed: Procedure(s) with comments: REPAIR STRABISMUS (Left) - EYE MUSCLE SURGERY LEFT EYE  Patient Location: PACU  Anesthesia Type:General  Level of Consciousness: awake, alert  and oriented  Airway & Oxygen Therapy: Patient Spontanous Breathing and Patient connected to nasal cannula oxygen  Post-op Assessment: Report given to PACU RN and Post -op Vital signs reviewed and stable  Post vital signs: Reviewed and stable  Complications: No apparent anesthesia complications

## 2013-07-31 NOTE — Anesthesia Preprocedure Evaluation (Addendum)
Anesthesia Evaluation  Patient identified by MRN, date of birth, ID band Patient awake, Patient confused and Patient unresponsive    Reviewed: Allergy & Precautions, H&P , NPO status , Patient's Chart, lab work & pertinent test results  Airway Mallampati: I TM Distance: >3 FB Neck ROM: Full    Dental   Pulmonary          Cardiovascular hypertension, Pt. on home beta blockers and Pt. on medications +CHF     Neuro/Psych  Headaches,    GI/Hepatic GERD-  Medicated and Controlled,  Endo/Other  diabetes, Well Controlled, Type 2, Oral Hypoglycemic Agents  Renal/GU      Musculoskeletal   Abdominal   Peds  Hematology   Anesthesia Other Findings   Reproductive/Obstetrics                          Anesthesia Physical Anesthesia Plan  ASA: II  Anesthesia Plan: General   Post-op Pain Management:    Induction: Intravenous  Airway Management Planned: Oral ETT  Additional Equipment:   Intra-op Plan:   Post-operative Plan: Extubation in OR  Informed Consent: I have reviewed the patients History and Physical, chart, labs and discussed the procedure including the risks, benefits and alternatives for the proposed anesthesia with the patient or authorized representative who has indicated his/her understanding and acceptance.     Plan Discussed with: CRNA and Surgeon  Anesthesia Plan Comments:        Anesthesia Quick Evaluation

## 2013-07-31 NOTE — Anesthesia Postprocedure Evaluation (Signed)
Anesthesia Post Note  Patient: [name]  Procedure(s) Performed: Procedure(s) (LRB): REPAIR STRABISMUS (Left)  Anesthesia type: general  Patient location: PACU  Post pain: Pain level controlled  Post assessment: Patient's Cardiovascular Status Stable  Last Vitals:  Filed Vitals:   07/31/13 0915  BP: 114/71  Pulse: 77  Temp:   Resp: 17    Post vital signs: Reviewed and stable  Level of consciousness: sedated  Complications: No apparent anesthesia complications

## 2013-08-03 ENCOUNTER — Encounter (HOSPITAL_COMMUNITY): Payer: Self-pay | Admitting: Ophthalmology

## 2013-12-24 ENCOUNTER — Other Ambulatory Visit: Payer: Self-pay

## 2014-01-09 ENCOUNTER — Encounter: Payer: Medicare Other | Attending: Internal Medicine

## 2014-01-09 VITALS — Ht 65.5 in | Wt 143.1 lb

## 2014-01-09 DIAGNOSIS — Z713 Dietary counseling and surveillance: Secondary | ICD-10-CM | POA: Insufficient documentation

## 2014-01-09 DIAGNOSIS — E119 Type 2 diabetes mellitus without complications: Secondary | ICD-10-CM

## 2014-01-09 NOTE — Patient Instructions (Signed)
Goals:  Monitor glucose levels as instructed by your doctor  Bring food record and glucose log to your next nutrition visit 

## 2014-01-09 NOTE — Progress Notes (Signed)
Patient was seen on 01/09/2014 for the first of a series of three diabetes self-management courses at the [facility].  Current HbA1c: 7.0 on 12/05/2013  The following learning objectives were met by the patient during this class:  Describe diabetes  State some common risk factors for diabetes  Defines the role of glucose and insulin  Identifies type of diabetes and pathophysiology  Describe the relationship between diabetes and cardiovascular risk  State the members of the Healthcare Team  States the rationale for glucose monitoring  State when to test glucose  State their individual Target Range  State the importance of logging glucose readings  Describe how to interpret glucose readings  Identifies A1C target  Explain the correlation between A1c and eAG values  State symptoms and treatment of high blood glucose  State symptoms and treatment of low blood glucose  Explain proper technique for glucose testing  Identifies proper sharps disposal  Handouts given during class include:  Living Well with Diabetes book  Carb Counting and Meal Planning book  Meal Plan Card  Carbohydrate guide  Meal planning worksheet  Low Sodium Flavoring Tips  The diabetes portion plate  A1c to eAG Conversion Chart  Diabetes Medications  Diabetes Recommended Care Schedule  Support Group  Diabetes Success Plan  Core Class Satisfaction Survey  Follow-Up Plan:  Attend core 2   

## 2014-01-16 ENCOUNTER — Ambulatory Visit: Payer: Medicare Other

## 2014-01-23 ENCOUNTER — Other Ambulatory Visit: Payer: Self-pay | Admitting: Internal Medicine

## 2014-01-23 ENCOUNTER — Ambulatory Visit: Payer: Medicare Other

## 2014-01-23 DIAGNOSIS — N63 Unspecified lump in unspecified breast: Secondary | ICD-10-CM

## 2014-01-31 ENCOUNTER — Ambulatory Visit
Admission: RE | Admit: 2014-01-31 | Discharge: 2014-01-31 | Disposition: A | Discharge status: Home / Self Care | Payer: Medicare Other | Source: Ambulatory Visit | Attending: Internal Medicine | Admitting: Internal Medicine

## 2014-01-31 DIAGNOSIS — N63 Unspecified lump in unspecified breast: Secondary | ICD-10-CM

## 2014-02-06 ENCOUNTER — Encounter: Payer: Medicare Other | Attending: Internal Medicine

## 2014-02-06 DIAGNOSIS — E119 Type 2 diabetes mellitus without complications: Secondary | ICD-10-CM | POA: Insufficient documentation

## 2014-02-06 DIAGNOSIS — Z713 Dietary counseling and surveillance: Secondary | ICD-10-CM | POA: Insufficient documentation

## 2014-02-07 NOTE — Progress Notes (Signed)

## 2014-02-13 DIAGNOSIS — E119 Type 2 diabetes mellitus without complications: Secondary | ICD-10-CM

## 2014-02-13 NOTE — Progress Notes (Signed)
Patient was seen on 02/13/2014 for the third of a series of three diabetes self-management courses at the [facility]. The following learning objectives were met by the patient during this class:    State the amount of activity recommended for healthy living   Describe activities suitable for individual needs   Identify ways to regularly incorporate activity into daily life   Identify barriers to activity and ways to over come these barriers  Identify diabetes medications being personally used and their primary action for lowering glucose and possible side effects   Describe role of stress on blood glucose and develop strategies to address psychosocial issues   Identify diabetes complications and ways to prevent them  Explain how to manage diabetes during illness   Evaluate success in meeting personal goal   Establish 2-3 goals that they will plan to diligently work on until they return for the  26-monthfollow-up visit  Goals:  Follow Diabetes Meal Plan as instructed  Aim for 15-30 mins of physical activity daily as tolerated  Bring food record and glucose log to your follow up visit  Your patient has established the following 4 month goals in their individualized success plan:  Count Carbohydrates at most meals and snacks  Take my diabetes medication as scheduled  Your patient has identified their diabetes self-care support plan as  [facility]  My house mate  Plan:  Attend Core 4 in 4 months

## 2014-05-15 ENCOUNTER — Other Ambulatory Visit (HOSPITAL_COMMUNITY): Payer: Self-pay | Admitting: Internal Medicine

## 2014-05-15 DIAGNOSIS — R110 Nausea: Secondary | ICD-10-CM

## 2014-05-27 ENCOUNTER — Ambulatory Visit (HOSPITAL_COMMUNITY): Payer: Medicare Other

## 2014-06-03 ENCOUNTER — Ambulatory Visit (HOSPITAL_COMMUNITY): Payer: Medicare Other

## 2014-06-03 ENCOUNTER — Encounter: Payer: Medicare Other | Attending: Internal Medicine

## 2014-06-03 DIAGNOSIS — E119 Type 2 diabetes mellitus without complications: Secondary | ICD-10-CM

## 2014-06-03 DIAGNOSIS — Z713 Dietary counseling and surveillance: Secondary | ICD-10-CM | POA: Diagnosis present

## 2014-06-04 NOTE — Progress Notes (Signed)
Patient was seen on 06/03/2014 for a review of the series of three diabetes self-management courses at the [facility]. The following learning objectives were met by the patient during this class:    Reviewed blood glucose monitoring and interpretation including the recommended target ranges and Hgb A1c.    Reviewed on carb counting, importance of regularly scheduled meals/snacks, and meal planning.    Reviewed the effects of physical activity on glucose levels and long-term glucose control.  Recommended goal of 150 minutes of physical activity/week.   Reviewed patient medications and discussed role of medication on blood glucose and possible side effects.   Discussed strategies to manage stress, psychosocial issues, and other obstacles to diabetes management.   Encouraged moderate weight reduction to improve glucose levels.     Reviewed short-term complications: hyper- and hypo-glycemia.  Discussed causes, symptoms, and treatment options.   Reviewed prevention, detection, and treatment of long-term complications.  Discussed the role of prolonged elevated glucose levels on body systems.  Goals:  Follow Diabetes Meal Plan as instructed  Eat 3 meals and 2 snacks, every 3-5 hrs  Limit carbohydrate intake to 45 grams carbohydrate/meal Limit carbohydrate intake to 15 grams carbohydrate/snack Add lean protein foods to meals/snacks  Monitor glucose levels as instructed by your doctor  Aim for goal of 15-30 mins of physical activity daily as tolerated  Bring food record and glucose log to your next nutrition visit   

## 2014-06-04 NOTE — Patient Instructions (Signed)
Goals:  Follow Diabetes Meal Plan as instructed  Eat 3 meals and 2 snacks, every 3-5 hrs  Limit carbohydrate intake to 45 grams carbohydrate/meal Limit carbohydrate intake to 15 grams carbohydrate/snack Add lean protein foods to meals/snacks  Monitor glucose levels as instructed by your doctor  Aim for goal of 15-30 mins of physical activity daily as tolerated  Bring food record and glucose log to your next nutrition visit 

## 2014-06-10 ENCOUNTER — Encounter (HOSPITAL_COMMUNITY)
Admission: RE | Admit: 2014-06-10 | Discharge: 2014-06-10 | Disposition: A | Discharge status: Home / Self Care | Payer: Medicare Other | Source: Ambulatory Visit | Attending: Diagnostic Radiology | Admitting: Diagnostic Radiology

## 2014-06-10 DIAGNOSIS — R110 Nausea: Secondary | ICD-10-CM

## 2014-06-10 DIAGNOSIS — R112 Nausea with vomiting, unspecified: Secondary | ICD-10-CM | POA: Insufficient documentation

## 2014-06-10 MED ORDER — TECHNETIUM TC 99M SULFUR COLLOID
2.2000 | Freq: Once | INTRAVENOUS | Status: AC | PRN
  Administered 2014-06-10: 2.2 via INTRAVENOUS

## 2014-10-28 ENCOUNTER — Other Ambulatory Visit: Payer: Self-pay | Admitting: Family Medicine

## 2014-10-28 DIAGNOSIS — M25511 Pain in right shoulder: Secondary | ICD-10-CM

## 2014-10-28 DIAGNOSIS — M542 Cervicalgia: Secondary | ICD-10-CM

## 2014-11-08 ENCOUNTER — Other Ambulatory Visit: Payer: Medicare Other

## 2014-11-11 ENCOUNTER — Ambulatory Visit
Admission: RE | Admit: 2014-11-11 | Discharge: 2014-11-11 | Disposition: A | Discharge status: Home / Self Care | Payer: Medicare Other | Source: Ambulatory Visit | Attending: Family Medicine | Admitting: Family Medicine

## 2014-11-11 DIAGNOSIS — M542 Cervicalgia: Secondary | ICD-10-CM

## 2014-11-11 DIAGNOSIS — M25511 Pain in right shoulder: Secondary | ICD-10-CM

## 2015-04-02 DIAGNOSIS — L03211 Cellulitis of face: Secondary | ICD-10-CM | POA: Diagnosis not present

## 2015-04-04 DIAGNOSIS — M21962 Unspecified acquired deformity of left lower leg: Secondary | ICD-10-CM | POA: Diagnosis not present

## 2015-04-04 DIAGNOSIS — M7989 Other specified soft tissue disorders: Secondary | ICD-10-CM | POA: Diagnosis not present

## 2015-04-04 DIAGNOSIS — S93125A Dislocation of metatarsophalangeal joint of left lesser toe(s), initial encounter: Secondary | ICD-10-CM | POA: Diagnosis not present

## 2015-04-11 DIAGNOSIS — M792 Neuralgia and neuritis, unspecified: Secondary | ICD-10-CM | POA: Diagnosis not present

## 2015-04-11 DIAGNOSIS — M71372 Other bursal cyst, left ankle and foot: Secondary | ICD-10-CM | POA: Diagnosis not present

## 2015-04-21 DIAGNOSIS — S93102A Unspecified subluxation of left toe(s), initial encounter: Secondary | ICD-10-CM | POA: Diagnosis not present

## 2015-04-21 DIAGNOSIS — M71372 Other bursal cyst, left ankle and foot: Secondary | ICD-10-CM | POA: Diagnosis not present

## 2015-04-21 DIAGNOSIS — M25572 Pain in left ankle and joints of left foot: Secondary | ICD-10-CM | POA: Diagnosis not present

## 2015-04-28 DIAGNOSIS — M71372 Other bursal cyst, left ankle and foot: Secondary | ICD-10-CM | POA: Diagnosis not present

## 2015-04-28 DIAGNOSIS — M25572 Pain in left ankle and joints of left foot: Secondary | ICD-10-CM | POA: Diagnosis not present

## 2015-04-28 DIAGNOSIS — M65872 Other synovitis and tenosynovitis, left ankle and foot: Secondary | ICD-10-CM | POA: Diagnosis not present

## 2015-05-14 DIAGNOSIS — G603 Idiopathic progressive neuropathy: Secondary | ICD-10-CM | POA: Diagnosis not present

## 2015-05-14 DIAGNOSIS — G5601 Carpal tunnel syndrome, right upper limb: Secondary | ICD-10-CM | POA: Diagnosis not present

## 2015-05-14 DIAGNOSIS — G5602 Carpal tunnel syndrome, left upper limb: Secondary | ICD-10-CM | POA: Diagnosis not present

## 2015-05-14 DIAGNOSIS — M5417 Radiculopathy, lumbosacral region: Secondary | ICD-10-CM | POA: Diagnosis not present

## 2015-05-14 DIAGNOSIS — M5412 Radiculopathy, cervical region: Secondary | ICD-10-CM | POA: Diagnosis not present

## 2015-05-21 DIAGNOSIS — H2512 Age-related nuclear cataract, left eye: Secondary | ICD-10-CM | POA: Diagnosis not present

## 2015-05-21 DIAGNOSIS — E119 Type 2 diabetes mellitus without complications: Secondary | ICD-10-CM | POA: Diagnosis not present

## 2015-05-21 DIAGNOSIS — R202 Paresthesia of skin: Secondary | ICD-10-CM | POA: Diagnosis not present

## 2015-05-21 DIAGNOSIS — H25811 Combined forms of age-related cataract, right eye: Secondary | ICD-10-CM | POA: Diagnosis not present

## 2015-05-21 DIAGNOSIS — H31092 Other chorioretinal scars, left eye: Secondary | ICD-10-CM | POA: Diagnosis not present

## 2015-05-21 DIAGNOSIS — R413 Other amnesia: Secondary | ICD-10-CM | POA: Diagnosis not present

## 2015-05-21 DIAGNOSIS — R634 Abnormal weight loss: Secondary | ICD-10-CM | POA: Diagnosis not present

## 2015-05-21 DIAGNOSIS — H04123 Dry eye syndrome of bilateral lacrimal glands: Secondary | ICD-10-CM | POA: Diagnosis not present

## 2015-05-21 DIAGNOSIS — G609 Hereditary and idiopathic neuropathy, unspecified: Secondary | ICD-10-CM | POA: Diagnosis not present

## 2015-05-23 DIAGNOSIS — R682 Dry mouth, unspecified: Secondary | ICD-10-CM | POA: Diagnosis not present

## 2015-05-23 DIAGNOSIS — Z7982 Long term (current) use of aspirin: Secondary | ICD-10-CM | POA: Diagnosis not present

## 2015-05-23 DIAGNOSIS — G4762 Sleep related leg cramps: Secondary | ICD-10-CM | POA: Diagnosis not present

## 2015-05-26 DIAGNOSIS — I5020 Unspecified systolic (congestive) heart failure: Secondary | ICD-10-CM | POA: Diagnosis not present

## 2015-05-26 DIAGNOSIS — I420 Dilated cardiomyopathy: Secondary | ICD-10-CM | POA: Diagnosis not present

## 2015-05-26 DIAGNOSIS — E119 Type 2 diabetes mellitus without complications: Secondary | ICD-10-CM | POA: Diagnosis not present

## 2015-05-26 DIAGNOSIS — E785 Hyperlipidemia, unspecified: Secondary | ICD-10-CM | POA: Diagnosis not present

## 2015-05-26 DIAGNOSIS — I10 Essential (primary) hypertension: Secondary | ICD-10-CM | POA: Diagnosis not present

## 2015-06-16 DIAGNOSIS — M71372 Other bursal cyst, left ankle and foot: Secondary | ICD-10-CM | POA: Diagnosis not present

## 2015-06-16 DIAGNOSIS — M71572 Other bursitis, not elsewhere classified, left ankle and foot: Secondary | ICD-10-CM | POA: Diagnosis not present

## 2015-06-18 DIAGNOSIS — R413 Other amnesia: Secondary | ICD-10-CM | POA: Diagnosis not present

## 2015-06-18 DIAGNOSIS — Z0000 Encounter for general adult medical examination without abnormal findings: Secondary | ICD-10-CM | POA: Diagnosis not present

## 2015-06-18 DIAGNOSIS — I130 Hypertensive heart and chronic kidney disease with heart failure and stage 1 through stage 4 chronic kidney disease, or unspecified chronic kidney disease: Secondary | ICD-10-CM | POA: Diagnosis not present

## 2015-06-18 DIAGNOSIS — N182 Chronic kidney disease, stage 2 (mild): Secondary | ICD-10-CM | POA: Diagnosis not present

## 2015-06-18 DIAGNOSIS — Z23 Encounter for immunization: Secondary | ICD-10-CM | POA: Diagnosis not present

## 2015-06-18 DIAGNOSIS — E1122 Type 2 diabetes mellitus with diabetic chronic kidney disease: Secondary | ICD-10-CM | POA: Diagnosis not present

## 2015-07-02 DIAGNOSIS — M5481 Occipital neuralgia: Secondary | ICD-10-CM | POA: Diagnosis not present

## 2015-07-02 DIAGNOSIS — G5601 Carpal tunnel syndrome, right upper limb: Secondary | ICD-10-CM | POA: Diagnosis not present

## 2015-07-02 DIAGNOSIS — G5602 Carpal tunnel syndrome, left upper limb: Secondary | ICD-10-CM | POA: Diagnosis not present

## 2015-07-02 DIAGNOSIS — G603 Idiopathic progressive neuropathy: Secondary | ICD-10-CM | POA: Diagnosis not present

## 2015-07-02 DIAGNOSIS — M5417 Radiculopathy, lumbosacral region: Secondary | ICD-10-CM | POA: Diagnosis not present

## 2015-07-09 DIAGNOSIS — M71372 Other bursal cyst, left ankle and foot: Secondary | ICD-10-CM | POA: Diagnosis not present

## 2015-07-22 ENCOUNTER — Other Ambulatory Visit: Payer: Self-pay | Admitting: Internal Medicine

## 2015-07-22 DIAGNOSIS — R42 Dizziness and giddiness: Secondary | ICD-10-CM | POA: Diagnosis not present

## 2015-07-22 DIAGNOSIS — R0683 Snoring: Secondary | ICD-10-CM | POA: Diagnosis not present

## 2015-07-22 DIAGNOSIS — R200 Anesthesia of skin: Secondary | ICD-10-CM

## 2015-07-22 DIAGNOSIS — Z23 Encounter for immunization: Secondary | ICD-10-CM | POA: Diagnosis not present

## 2015-07-22 DIAGNOSIS — R202 Paresthesia of skin: Secondary | ICD-10-CM | POA: Diagnosis not present

## 2015-07-22 DIAGNOSIS — I130 Hypertensive heart and chronic kidney disease with heart failure and stage 1 through stage 4 chronic kidney disease, or unspecified chronic kidney disease: Secondary | ICD-10-CM | POA: Diagnosis not present

## 2015-07-25 ENCOUNTER — Ambulatory Visit
Admission: RE | Admit: 2015-07-25 | Discharge: 2015-07-25 | Disposition: A | Discharge status: Home / Self Care | Payer: Medicare Other | Source: Ambulatory Visit | Attending: Internal Medicine | Admitting: Internal Medicine

## 2015-07-25 DIAGNOSIS — R200 Anesthesia of skin: Secondary | ICD-10-CM | POA: Diagnosis not present

## 2015-07-29 DIAGNOSIS — G519 Disorder of facial nerve, unspecified: Secondary | ICD-10-CM | POA: Diagnosis not present

## 2015-07-29 DIAGNOSIS — R202 Paresthesia of skin: Secondary | ICD-10-CM | POA: Diagnosis not present

## 2015-07-29 DIAGNOSIS — G5602 Carpal tunnel syndrome, left upper limb: Secondary | ICD-10-CM | POA: Diagnosis not present

## 2015-07-29 DIAGNOSIS — G603 Idiopathic progressive neuropathy: Secondary | ICD-10-CM | POA: Diagnosis not present

## 2015-07-29 DIAGNOSIS — M5442 Lumbago with sciatica, left side: Secondary | ICD-10-CM | POA: Diagnosis not present

## 2015-07-29 DIAGNOSIS — M5441 Lumbago with sciatica, right side: Secondary | ICD-10-CM | POA: Diagnosis not present

## 2015-07-29 DIAGNOSIS — G5601 Carpal tunnel syndrome, right upper limb: Secondary | ICD-10-CM | POA: Diagnosis not present

## 2015-08-11 DIAGNOSIS — G9389 Other specified disorders of brain: Secondary | ICD-10-CM | POA: Diagnosis not present

## 2015-08-11 DIAGNOSIS — G3184 Mild cognitive impairment, so stated: Secondary | ICD-10-CM | POA: Diagnosis not present

## 2015-08-11 DIAGNOSIS — R9082 White matter disease, unspecified: Secondary | ICD-10-CM | POA: Diagnosis not present

## 2015-08-11 DIAGNOSIS — H61892 Other specified disorders of left external ear: Secondary | ICD-10-CM | POA: Diagnosis not present

## 2015-08-11 DIAGNOSIS — Z1389 Encounter for screening for other disorder: Secondary | ICD-10-CM | POA: Diagnosis not present

## 2015-08-25 DIAGNOSIS — R220 Localized swelling, mass and lump, head: Secondary | ICD-10-CM | POA: Diagnosis not present

## 2015-08-25 DIAGNOSIS — H61892 Other specified disorders of left external ear: Secondary | ICD-10-CM | POA: Diagnosis not present

## 2015-09-01 DIAGNOSIS — M542 Cervicalgia: Secondary | ICD-10-CM | POA: Diagnosis not present

## 2015-09-01 DIAGNOSIS — I420 Dilated cardiomyopathy: Secondary | ICD-10-CM | POA: Diagnosis not present

## 2015-09-01 DIAGNOSIS — F17290 Nicotine dependence, other tobacco product, uncomplicated: Secondary | ICD-10-CM | POA: Diagnosis not present

## 2015-09-01 DIAGNOSIS — E119 Type 2 diabetes mellitus without complications: Secondary | ICD-10-CM | POA: Diagnosis not present

## 2015-09-01 DIAGNOSIS — I119 Hypertensive heart disease without heart failure: Secondary | ICD-10-CM | POA: Diagnosis not present

## 2015-09-01 DIAGNOSIS — E785 Hyperlipidemia, unspecified: Secondary | ICD-10-CM | POA: Diagnosis not present

## 2015-09-01 DIAGNOSIS — M65342 Trigger finger, left ring finger: Secondary | ICD-10-CM | POA: Diagnosis not present

## 2015-09-01 DIAGNOSIS — M79641 Pain in right hand: Secondary | ICD-10-CM | POA: Diagnosis not present

## 2015-09-01 DIAGNOSIS — M65352 Trigger finger, left little finger: Secondary | ICD-10-CM | POA: Diagnosis not present

## 2015-09-04 DIAGNOSIS — M542 Cervicalgia: Secondary | ICD-10-CM | POA: Diagnosis not present

## 2015-09-04 DIAGNOSIS — G603 Idiopathic progressive neuropathy: Secondary | ICD-10-CM | POA: Diagnosis not present

## 2015-09-04 DIAGNOSIS — M5441 Lumbago with sciatica, right side: Secondary | ICD-10-CM | POA: Diagnosis not present

## 2015-09-04 DIAGNOSIS — G5603 Carpal tunnel syndrome, bilateral upper limbs: Secondary | ICD-10-CM | POA: Diagnosis not present

## 2015-09-04 DIAGNOSIS — M5442 Lumbago with sciatica, left side: Secondary | ICD-10-CM | POA: Diagnosis not present

## 2015-09-10 DIAGNOSIS — M722 Plantar fascial fibromatosis: Secondary | ICD-10-CM | POA: Diagnosis not present

## 2015-09-10 DIAGNOSIS — M71372 Other bursal cyst, left ankle and foot: Secondary | ICD-10-CM | POA: Diagnosis not present

## 2015-09-10 DIAGNOSIS — Z79899 Other long term (current) drug therapy: Secondary | ICD-10-CM | POA: Diagnosis not present

## 2015-09-10 DIAGNOSIS — E119 Type 2 diabetes mellitus without complications: Secondary | ICD-10-CM | POA: Diagnosis not present

## 2015-09-22 ENCOUNTER — Emergency Department (HOSPITAL_COMMUNITY): Payer: Medicare Other

## 2015-09-22 ENCOUNTER — Emergency Department (HOSPITAL_COMMUNITY)
Admission: EM | Admit: 2015-09-22 | Discharge: 2015-09-22 | Disposition: A | Discharge status: Home / Self Care | Payer: Medicare Other | Attending: Emergency Medicine | Admitting: Emergency Medicine

## 2015-09-22 ENCOUNTER — Encounter (HOSPITAL_COMMUNITY): Payer: Self-pay | Admitting: Oncology

## 2015-09-22 DIAGNOSIS — R05 Cough: Secondary | ICD-10-CM | POA: Diagnosis not present

## 2015-09-22 DIAGNOSIS — Z7982 Long term (current) use of aspirin: Secondary | ICD-10-CM | POA: Insufficient documentation

## 2015-09-22 DIAGNOSIS — Z87891 Personal history of nicotine dependence: Secondary | ICD-10-CM | POA: Diagnosis not present

## 2015-09-22 DIAGNOSIS — Z9119 Patient's noncompliance with other medical treatment and regimen: Secondary | ICD-10-CM | POA: Diagnosis not present

## 2015-09-22 DIAGNOSIS — Z79899 Other long term (current) drug therapy: Secondary | ICD-10-CM | POA: Diagnosis not present

## 2015-09-22 DIAGNOSIS — E119 Type 2 diabetes mellitus without complications: Secondary | ICD-10-CM | POA: Insufficient documentation

## 2015-09-22 DIAGNOSIS — M1990 Unspecified osteoarthritis, unspecified site: Secondary | ICD-10-CM | POA: Insufficient documentation

## 2015-09-22 DIAGNOSIS — J209 Acute bronchitis, unspecified: Secondary | ICD-10-CM | POA: Diagnosis not present

## 2015-09-22 DIAGNOSIS — Z7952 Long term (current) use of systemic steroids: Secondary | ICD-10-CM | POA: Insufficient documentation

## 2015-09-22 DIAGNOSIS — I509 Heart failure, unspecified: Secondary | ICD-10-CM | POA: Insufficient documentation

## 2015-09-22 DIAGNOSIS — Z792 Long term (current) use of antibiotics: Secondary | ICD-10-CM | POA: Diagnosis not present

## 2015-09-22 DIAGNOSIS — E7800 Pure hypercholesterolemia, unspecified: Secondary | ICD-10-CM | POA: Diagnosis not present

## 2015-09-22 DIAGNOSIS — R0602 Shortness of breath: Secondary | ICD-10-CM | POA: Diagnosis not present

## 2015-09-22 DIAGNOSIS — K219 Gastro-esophageal reflux disease without esophagitis: Secondary | ICD-10-CM | POA: Insufficient documentation

## 2015-09-22 LAB — CBC WITH DIFFERENTIAL/PLATELET
BASOS PCT: 0 %
Basophils Absolute: 0 10*3/uL (ref 0.0–0.1)
EOS PCT: 2 %
Eosinophils Absolute: 0.2 10*3/uL (ref 0.0–0.7)
HEMATOCRIT: 38.1 % (ref 36.0–46.0)
HEMOGLOBIN: 12.4 g/dL (ref 12.0–15.0)
LYMPHS PCT: 39 %
Lymphs Abs: 3 10*3/uL (ref 0.7–4.0)
MCH: 29 pg (ref 26.0–34.0)
MCHC: 32.5 g/dL (ref 30.0–36.0)
MCV: 89 fL (ref 78.0–100.0)
MONO ABS: 0.5 10*3/uL (ref 0.1–1.0)
MONOS PCT: 7 %
NEUTROS PCT: 52 %
Neutro Abs: 4 10*3/uL (ref 1.7–7.7)
PLATELETS: 201 10*3/uL (ref 150–400)
RBC: 4.28 MIL/uL (ref 3.87–5.11)
RDW: 14.7 % (ref 11.5–15.5)
WBC: 7.7 10*3/uL (ref 4.0–10.5)

## 2015-09-22 LAB — COMPREHENSIVE METABOLIC PANEL
ALT: 26 U/L (ref 14–54)
ANION GAP: 11 (ref 5–15)
AST: 28 U/L (ref 15–41)
Albumin: 3.9 g/dL (ref 3.5–5.0)
Alkaline Phosphatase: 60 U/L (ref 38–126)
BUN: 20 mg/dL (ref 6–20)
CHLORIDE: 100 mmol/L — AB (ref 101–111)
CO2: 28 mmol/L (ref 22–32)
Calcium: 9.3 mg/dL (ref 8.9–10.3)
Creatinine, Ser: 0.94 mg/dL (ref 0.44–1.00)
Glucose, Bld: 157 mg/dL — ABNORMAL HIGH (ref 65–99)
POTASSIUM: 4.1 mmol/L (ref 3.5–5.1)
SODIUM: 139 mmol/L (ref 135–145)
Total Bilirubin: 0.6 mg/dL (ref 0.3–1.2)
Total Protein: 7.2 g/dL (ref 6.5–8.1)

## 2015-09-22 LAB — I-STAT TROPONIN, ED: Troponin i, poc: 0 ng/mL (ref 0.00–0.08)

## 2015-09-22 LAB — BRAIN NATRIURETIC PEPTIDE: B NATRIURETIC PEPTIDE 5: 7.5 pg/mL (ref 0.0–100.0)

## 2015-09-22 MED ORDER — ALBUTEROL SULFATE HFA 108 (90 BASE) MCG/ACT IN AERS
2.0000 | INHALATION_SPRAY | RESPIRATORY_TRACT | Status: DC | PRN
  Administered 2015-09-22: 2 via RESPIRATORY_TRACT
  Filled 2015-09-22: qty 6.7

## 2015-09-22 MED ORDER — HYDROCOD POLST-CPM POLST ER 10-8 MG/5ML PO SUER: 5.0000 mL | Freq: Two times a day (BID) | ORAL | Status: DC | PRN

## 2015-09-22 MED ORDER — PREDNISONE 10 MG PO TABS: 20.0000 mg | ORAL_TABLET | Freq: Two times a day (BID) | ORAL | Status: DC

## 2015-09-22 MED ORDER — FUROSEMIDE 10 MG/ML IJ SOLN
40.0000 mg | Freq: Once | INTRAMUSCULAR | Status: AC
  Administered 2015-09-22: 40 mg via INTRAVENOUS
  Filled 2015-09-22: qty 4

## 2015-09-22 MED ORDER — AZITHROMYCIN 250 MG PO TABS: ORAL_TABLET | ORAL | Status: DC

## 2015-09-22 MED ORDER — ALBUTEROL SULFATE (2.5 MG/3ML) 0.083% IN NEBU
INHALATION_SOLUTION | RESPIRATORY_TRACT | Status: AC
  Filled 2015-09-22: qty 6

## 2015-09-22 MED ORDER — ALBUTEROL SULFATE (2.5 MG/3ML) 0.083% IN NEBU
5.0000 mg | INHALATION_SOLUTION | Freq: Once | RESPIRATORY_TRACT | Status: AC
  Administered 2015-09-22: 5 mg via RESPIRATORY_TRACT

## 2015-09-22 MED ORDER — METHYLPREDNISOLONE SODIUM SUCC 125 MG IJ SOLR
125.0000 mg | Freq: Once | INTRAMUSCULAR | Status: AC
  Administered 2015-09-22: 125 mg via INTRAVENOUS
  Filled 2015-09-22: qty 2

## 2015-09-22 NOTE — ED Provider Notes (Signed)
CSN: [id number]     Arrival date & time 09/22/15  0449 History   First MD Initiated Contact with Patient 09/22/15 0451     Chief Complaint  Patient presents with  . Shortness of Breath     (Consider location/radiation/quality/duration/timing/severity/associated sxs/prior Treatment) HPI Comments: Patient is a 54 year old female with history of nonischemic cardiomyopathy, hypertension, LVH. She presents for evaluation of shortness of breath that is worsened over the past week. She reports wheezing and cough productive of occasional yellow brown sputum. She denies any fevers or chills. She denies to me she is experiencing any chest pain. She denies any swelling of her legs.  Patient is a 54 y.o. female presenting with shortness of breath. The history is provided by the patient.  Shortness of Breath Severity:  Moderate Onset quality:  Gradual Duration:  1 week Timing:  Constant Progression:  Worsening Chronicity:  New Relieved by:  Nothing Worsened by:  Nothing tried Ineffective treatments:  None tried Associated symptoms: no chest pain and no fever     Past Medical History  Diagnosis Date  . Bronchitis   . Nonischemic cardiomyopathy ([place])   . Chronic systolic dysfunction of left ventricle   . Hypertension   . LVH (left ventricular hypertrophy)   . Tobacco abuse disorder   . Polysubstance abuse   . History of noncompliance with medical treatment   . Hypercholesteremia   . CHF (congestive heart failure) ([place])   . Diabetes mellitus without complication ([facility])     on oral meds  . GERD (gastroesophageal reflux disease)   . Headache(784.0)     migraines, hasn't had one for over a year  . Arthritis   . Bowel obstruction [facility])    Past Surgical History  Procedure Laterality Date  . Abdominal hysterectomy    . Abdominal surgery    . Uterine fibroid surgery    . Strabismus surgery Left 07/31/2013    Procedure: REPAIR STRABISMUS;  Surgeon: Derry Skill, MD;  Location: [facility];   Service: Ophthalmology;  Laterality: Left;  EYE MUSCLE SURGERY LEFT EYE   No family history on file. Social History  Substance Use Topics  . Smoking status: Former Smoker -- 0.50 packs/day for 30 years    Quit date: 07/02/2012  . Smokeless tobacco: Never Used     Comment: she is not ready to quit  . Alcohol Use: Yes     Comment: only one drink in past 2 months, States rarely drinks now. Used to be a daily drinker   OB History    No data available     Review of Systems  Constitutional: Negative for fever.  Respiratory: Positive for shortness of breath.   Cardiovascular: Negative for chest pain.  All other systems reviewed and are negative.     Allergies  Codeine  Home Medications   Prior to Admission medications   Medication Sig Start Date End Date Taking? Authorizing Provider  aspirin 81 MG EC tablet Take 81 mg by mouth daily. Swallow whole.    Historical Provider, MD  atorvastatin (LIPITOR) 20 MG tablet Take 20 mg by mouth daily.    Historical Provider, MD  carvedilol (COREG) 25 MG tablet Take 25 mg by mouth 2 (two) times daily with a meal.     Historical Provider, MD  cyclobenzaprine (FLEXERIL) 10 MG tablet Take 10 mg by mouth 2 (two) times daily as needed for muscle spasms.    Historical Provider, MD  folic acid (FOLVITE) 1 MG tablet Take 1  mg by mouth daily.    Historical Provider, MD  furosemide (LASIX) 40 MG tablet Take 1 tablet (40 mg total) by mouth daily. 11/25/11   Samuella Cota, MD  ibuprofen (ADVIL,MOTRIN) 200 MG tablet Take 400 mg by mouth every 6 (six) hours as needed for pain.    Historical Provider, MD  lisinopril (PRINIVIL,ZESTRIL) 10 MG tablet Take 20 mg by mouth 3 (three) times daily.     Historical Provider, MD  Melatonin 3 MG TABS Take 3 mg by mouth at bedtime.    Historical Provider, MD  metFORMIN (GLUCOPHAGE) 500 MG tablet Take 500 mg by mouth 2 (two) times daily with a meal.    Historical Provider, MD  omeprazole (PRILOSEC) 40 MG capsule Take 40 mg  by mouth daily.    Historical Provider, MD  spironolactone (ALDACTONE) 25 MG tablet Take 25 mg by mouth 2 (two) times daily.    Historical Provider, MD  thiamine 100 MG tablet Take 100 mg by mouth daily.    Historical Provider, MD  tobramycin-dexamethasone [facility]) ophthalmic ointment Place into the left eye 2 (two) times daily. 07/31/13   Everitt Amber, MD   BP 140/99 mmHg  Pulse 103  Temp(Src) 98.9 F (37.2 C) (Oral)  Resp 20  Ht 5\' 5"  (1.651 m)  Wt 145 lb (65.772 kg)  BMI 24.13 kg/m2  SpO2 100% Physical Exam  Constitutional: She is oriented to person, place, and time. She appears well-developed and well-nourished. No distress.  HENT:  Head: Normocephalic and atraumatic.  Mouth/Throat: Oropharynx is clear and moist.  Neck: Normal range of motion. Neck supple.  Cardiovascular: Normal rate and regular rhythm.  Exam reveals no gallop and no friction rub.   No murmur heard. Pulmonary/Chest: Effort normal. No respiratory distress. She has wheezes.  There are rhonchorous breath sounds audible bilaterally to auscultation.  Abdominal: Soft. Bowel sounds are normal. She exhibits no distension. There is no tenderness.  Musculoskeletal: Normal range of motion. She exhibits no edema or tenderness.  Neurological: She is alert and oriented to person, place, and time.  Skin: Skin is warm and dry. She is not diaphoretic.  Nursing note and vitals reviewed.   ED Course  Procedures (including critical care time) Labs Review Labs Reviewed  COMPREHENSIVE METABOLIC PANEL  CBC WITH DIFFERENTIAL/PLATELET  BRAIN NATRIURETIC PEPTIDE  I-STAT [place], ED    Imaging Review No results found. I have personally reviewed and evaluated these images and lab results as part of my medical decision-making.   EKG Interpretation   Date/Time:  Monday September 22 2015 05:00:27 EST Ventricular Rate:  104 PR Interval:  153 QRS Duration: 77 QT Interval:  352 QTC Calculation: 463 R Axis:   26 Text  Interpretation:  Sinus tachycardia Baseline wander in lead(s) II III  aVF V1 V2 V3 V4 V5 Confirmed by Breeanne Oblinger  MD, Pattricia Weiher ([id number]) on 09/22/2015  5:10:44 AM      MDM   Final diagnoses:  None    Patient is a 54 year old female with history of bronchitis, cardiomyopathy, CHF, hypertension. She presents for evaluation of chest congestion and cough which is worsened over the past week. She reports coughing up yellow-brown sputum. She denies any fevers or chills.  She does have some rhonchorous breath sounds initially which have improved with an albuterol treatment and steroids. She was also given Lasix in case this was a fluid issue. However, this does not appear to be the case as her BNP is normal and chest x-ray is not suggestive  of this. I suspect a bronchitis. She will be treated with Zithromax, prednisone, cough medication, and an albuterol inhaler.  She is not hypoxic or in respiratory distress, and the remainder of her workup is reassuring.    Veryl Speak, MD 09/22/15 [phone]

## 2015-09-22 NOTE — Discharge Instructions (Signed)
Zithromax and prednisone as prescribed. Tussionex as prescribed as needed for cough.  Albuterol inhaler: 2 puffs every 4 hours as needed for wheezing.  Return to the emergency department if your symptoms significantly worsen or change.   Acute Bronchitis Bronchitis is inflammation of the airways that extend from the windpipe into the lungs (bronchi). The inflammation often causes mucus to develop. This leads to a cough, which is the most common symptom of bronchitis.  In acute bronchitis, the condition usually develops suddenly and goes away over time, usually in a couple weeks. Smoking, allergies, and asthma can make bronchitis worse. Repeated episodes of bronchitis may cause further lung problems.  CAUSES Acute bronchitis is most often caused by the same virus that causes a cold. The virus can spread from person to person (contagious) through coughing, sneezing, and touching contaminated objects. SIGNS AND SYMPTOMS   Cough.   Fever.   Coughing up mucus.   Body aches.   Chest congestion.   Chills.   Shortness of breath.   Sore throat.  DIAGNOSIS  Acute bronchitis is usually diagnosed through a physical exam. Your health care provider will also ask you questions about your medical history. Tests, such as chest X-rays, are sometimes done to rule out other conditions.  TREATMENT  Acute bronchitis usually goes away in a couple weeks. Oftentimes, no medical treatment is necessary. Medicines are sometimes given for relief of fever or cough. Antibiotic medicines are usually not needed but may be prescribed in certain situations. In some cases, an inhaler may be recommended to help reduce shortness of breath and control the cough. A cool mist vaporizer may also be used to help thin bronchial secretions and make it easier to clear the chest.  HOME CARE INSTRUCTIONS  Get plenty of rest.   Drink enough fluids to keep your urine clear or pale yellow (unless you have a medical  condition that requires fluid restriction). Increasing fluids may help thin your respiratory secretions (sputum) and reduce chest congestion, and it will prevent dehydration.   Take medicines only as directed by your health care provider.  If you were prescribed an antibiotic medicine, finish it all even if you start to feel better.  Avoid smoking and secondhand smoke. Exposure to cigarette smoke or irritating chemicals will make bronchitis worse. If you are a smoker, consider using nicotine gum or skin patches to help control withdrawal symptoms. Quitting smoking will help your lungs heal faster.   Reduce the chances of another bout of acute bronchitis by washing your hands frequently, avoiding people with cold symptoms, and trying not to touch your hands to your mouth, nose, or eyes.   Keep all follow-up visits as directed by your health care provider.  SEEK MEDICAL CARE IF: Your symptoms do not improve after 1 week of treatment.  SEEK IMMEDIATE MEDICAL CARE IF:  You develop an increased fever or chills.   You have chest pain.   You have severe shortness of breath.  You have bloody sputum.   You develop dehydration.  You faint or repeatedly feel like you are going to pass out.  You develop repeated vomiting.  You develop a severe headache. MAKE SURE YOU:   Understand these instructions.  Will watch your condition.  Will get help right away if you are not doing well or get worse.   This information is not intended to replace advice given to you by your health care provider. Make sure you discuss any questions you have with your health  care provider.   Document Released: 11/25/2004 Document Revised: 11/08/2014 Document Reviewed: 04/10/2013 Elsevier Interactive Patient Education 2016 [employer]  Acute Bronchitis Bronchitis is inflammation of the airways that extend from the windpipe into the lungs (bronchi). The inflammation often causes mucus to develop. This  leads to a cough, which is the most common symptom of bronchitis.  In acute bronchitis, the condition usually develops suddenly and goes away over time, usually in a couple weeks. Smoking, allergies, and asthma can make bronchitis worse. Repeated episodes of bronchitis may cause further lung problems.  CAUSES Acute bronchitis is most often caused by the same virus that causes a cold. The virus can spread from person to person (contagious) through coughing, sneezing, and touching contaminated objects. SIGNS AND SYMPTOMS   Cough.   Fever.   Coughing up mucus.   Body aches.   Chest congestion.   Chills.   Shortness of breath.   Sore throat.  DIAGNOSIS  Acute bronchitis is usually diagnosed through a physical exam. Your health care provider will also ask you questions about your medical history. Tests, such as chest X-rays, are sometimes done to rule out other conditions.  TREATMENT  Acute bronchitis usually goes away in a couple weeks. Oftentimes, no medical treatment is necessary. Medicines are sometimes given for relief of fever or cough. Antibiotic medicines are usually not needed but may be prescribed in certain situations. In some cases, an inhaler may be recommended to help reduce shortness of breath and control the cough. A cool mist vaporizer may also be used to help thin bronchial secretions and make it easier to clear the chest.  HOME CARE INSTRUCTIONS  Get plenty of rest.   Drink enough fluids to keep your urine clear or pale yellow (unless you have a medical condition that requires fluid restriction). Increasing fluids may help thin your respiratory secretions (sputum) and reduce chest congestion, and it will prevent dehydration.   Take medicines only as directed by your health care provider.  If you were prescribed an antibiotic medicine, finish it all even if you start to feel better.  Avoid smoking and secondhand smoke. Exposure to cigarette smoke or  irritating chemicals will make bronchitis worse. If you are a smoker, consider using nicotine gum or skin patches to help control withdrawal symptoms. Quitting smoking will help your lungs heal faster.   Reduce the chances of another bout of acute bronchitis by washing your hands frequently, avoiding people with cold symptoms, and trying not to touch your hands to your mouth, nose, or eyes.   Keep all follow-up visits as directed by your health care provider.  SEEK MEDICAL CARE IF: Your symptoms do not improve after 1 week of treatment.  SEEK IMMEDIATE MEDICAL CARE IF:  You develop an increased fever or chills.   You have chest pain.   You have severe shortness of breath.  You have bloody sputum.   You develop dehydration.  You faint or repeatedly feel like you are going to pass out.  You develop repeated vomiting.  You develop a severe headache. MAKE SURE YOU:   Understand these instructions.  Will watch your condition.  Will get help right away if you are not doing well or get worse.   This information is not intended to replace advice given to you by your health care provider. Make sure you discuss any questions you have with your health care provider.   Document Released: 11/25/2004 Document Revised: 11/08/2014 Document Reviewed: 04/10/2013 Elsevier Interactive Patient Education  Education ©2016 Elsevier Inc. ° °

## 2015-09-22 NOTE — ED Notes (Signed)
Pt presents d/t intermittent SOB and cough x 1 week.  Pt is speaking in full sentences. Audible wheezing noted.

## 2015-09-24 ENCOUNTER — Other Ambulatory Visit: Payer: Self-pay | Admitting: Radiation Therapy

## 2015-09-24 DIAGNOSIS — M25511 Pain in right shoulder: Secondary | ICD-10-CM | POA: Diagnosis not present

## 2015-09-24 DIAGNOSIS — D333 Benign neoplasm of cranial nerves: Secondary | ICD-10-CM | POA: Diagnosis not present

## 2015-09-24 DIAGNOSIS — M4802 Spinal stenosis, cervical region: Secondary | ICD-10-CM | POA: Diagnosis not present

## 2015-09-24 DIAGNOSIS — J069 Acute upper respiratory infection, unspecified: Secondary | ICD-10-CM | POA: Diagnosis not present

## 2015-09-24 DIAGNOSIS — I10 Essential (primary) hypertension: Secondary | ICD-10-CM | POA: Diagnosis not present

## 2015-09-29 NOTE — Progress Notes (Signed)
Location/Histology of Brain Tumor:  Acoustic Neuroma   Patient presented with symptoms of:  Memory loss,mild intermittent head aches  Past or anticipated interventions, if any, per neurosurgery: Dr. Kathyrn Sheriff seen on 09/24/15, MRI brain 08/25/15   Past or anticipated interventions, if any, per medical oncology: NO  Dose of Decadron, if applicable: No  Recent neurologic symptoms, if any:   Seizures:  No  Headaches: mild intermittent, none at present  Nausea:  No  Dizziness/ataxia:  No  Difficulty with hand coordination: yes,  Difficulty opening bottles, weakness   Focal numbness/weakness: in hands, left hand numbness at times, left foot toes numbness,   Visual deficits/changes: NO   Confusion/Memory deficits: NO  Painful bone metastases at present, if any:  Pain across shoulder and down her back to sacrum, 7/10 scale  Just started last few days stated  SAFETY ISSUES: No  Prior radiation? NO  Pacemaker/ICD? NO  Possible current pregnancy? NO  Is the patient on methotrexate? NO  Additional Complaints / other details: former smoker 0.5ppd x 30 years, quit 07/02/12; ,polysubance abuse hx,non-compliance medical tx,Cardiomyopathy,CHF ,HTMN, Bronchitis,  DM,Bowel obstruction, strabismus surgery, alcohol use  Allergies:Codeine=N&V intolerance  Wt Readings from Last 3 Encounters:  10/01/15 143 lb 1.6 oz (64.91 kg)  09/22/15 145 lb (65.772 kg)  01/09/14 143 lb 1.6 oz (64.91 kg)   BP 110/84 mmHg  Pulse 89  Temp(Src) 98 F (36.7 C) (Oral)  Resp 16  Wt 143 lb 1.6 oz (64.91 kg)  SpO2 100%

## 2015-10-01 ENCOUNTER — Encounter: Payer: Self-pay | Admitting: Radiation Oncology

## 2015-10-01 ENCOUNTER — Ambulatory Visit
Admission: RE | Admit: 2015-10-01 | Discharge: 2015-10-01 | Disposition: A | Discharge status: Home / Self Care | Payer: Medicare Other | Source: Ambulatory Visit | Attending: Radiation Oncology | Admitting: Radiation Oncology

## 2015-10-01 VITALS — BP 110/84 | HR 89 | Temp 98.0°F | Resp 16 | Wt 143.1 lb

## 2015-10-01 DIAGNOSIS — D333 Benign neoplasm of cranial nerves: Secondary | ICD-10-CM | POA: Diagnosis not present

## 2015-10-01 NOTE — Progress Notes (Signed)
Radiation Oncology         [phone] ________________________________  Name: [name] MRN: [id number]  Date: 10/01/2015  DOB: 1961-10-13  [id number] N, MD  Consuella Lose, MD     REFERRING PHYSICIAN: Consuella Lose, MD   DIAGNOSIS: The primary encounter diagnosis was Acoustic neuroma [facility]). A diagnosis of Left acoustic neuroma [facility]) was also pertinent to this visit.   HISTORY OF PRESENT ILLNESS::[name] is a 54 y.o. female who is seen for an initial consultation visit. She reports that she had left foot surgery a while back. The foot didn't heal properly and was directed to a neurologist. She was sent to Dr. Trula Ore in [place] who performed an MRI of the brain without contrast on 08/11/15. It found an abnormality in left internal auditory canal. A repeat MRI of the brain with contrast on 08/25/15 confirmed a 4 x 5 mm enhancing mass in the left internal auditory canal consistent with intracanalicular acoustic schwannoma. No additional areas of abnormal enhancement were noted. The dural sinuses enhance normally. She saw Dr. Kathyrn Sheriff on 09/24/15 who kindly referred the patient to me for the consideration of radiotherapy for the management of her disease.  PREVIOUS RADIATION THERAPY: No  PAST MEDICAL HISTORY:  has a past medical history of Bronchitis; Nonischemic cardiomyopathy ([place]); Chronic systolic dysfunction of left ventricle; Hypertension; LVH (left ventricular hypertrophy); Tobacco abuse disorder; Polysubstance abuse; History of noncompliance with medical treatment; Hypercholesteremia; CHF (congestive heart failure) ([place]); Diabetes mellitus without complication ([place]); GERD (gastroesophageal reflux disease); Headache(784.0); Arthritis; and Bowel obstruction ([place]).     PAST SURGICAL HISTORY: Past Surgical History  Procedure Laterality Date  . Abdominal hysterectomy    . Abdominal surgery    . Uterine fibroid surgery    . Strabismus surgery Left  07/31/2013    Procedure: REPAIR STRABISMUS;  Surgeon: Derry Skill, MD;  Location: [place];  Service: Ophthalmology;  Laterality: Left;  EYE MUSCLE SURGERY LEFT EYE     FAMILY HISTORY: family history is not on file.   SOCIAL HISTORY:  reports that she quit smoking about 3 years ago. She has never used smokeless tobacco. She reports that she drinks alcohol. She reports that she does not use illicit drugs.   ALLERGIES: Codeine   MEDICATIONS:  Current Outpatient Prescriptions  Medication Sig Dispense Refill  . aspirin 81 MG EC tablet Take 81 mg by mouth daily. Swallow whole.    Marland Kitchen atorvastatin (LIPITOR) 20 MG tablet Take 20 mg by mouth daily.    . carvedilol (COREG) 25 MG tablet Take 25 mg by mouth 2 (two) times daily with a meal.     . folic acid (FOLVITE) 1 MG tablet Take 1 mg by mouth daily.    . furosemide (LASIX) 40 MG tablet Take 1 tablet (40 mg total) by mouth daily. 30 tablet 0  . gabapentin (NEURONTIN) 600 MG tablet Take 600 mg by mouth at bedtime.    Marland Kitchen lisinopril (PRINIVIL,ZESTRIL) 20 MG tablet Take 20 mg by mouth daily.    . Magnesium Oxide (MAG-200 PO) Take 200 mg by mouth at bedtime.    . meloxicam (MOBIC) 15 MG tablet Take 15 mg by mouth daily as needed. For pain.    . metFORMIN (GLUCOPHAGE-XR) 500 MG 24 hr tablet Take 500 mg by mouth 2 (two) times daily.    . methocarbamol (ROBAXIN) 500 MG tablet Take 500 mg by mouth 2 (two) times daily as needed for muscle spasms.    Marland Kitchen omeprazole (PRILOSEC) 40  MG capsule Take 40 mg by mouth daily.    Marland Kitchen pyridOXINE (VITAMIN B-6) 100 MG tablet Take 100 mg by mouth daily.    Marland Kitchen spironolactone (ALDACTONE) 25 MG tablet Take 25 mg by mouth 2 (two) times daily.    . chlorpheniramine-HYDROcodone (TUSSIONEX PENNKINETIC ER) 10-8 MG/5ML SUER Take 5 mLs by mouth every 12 (twelve) hours as needed for cough. (Patient not taking: Reported on 10/01/2015) 140 mL 0  . Potassium 99 MG TABS Take 99 mg by mouth daily.    . predniSONE (DELTASONE) 10 MG tablet  Take 2 tablets (20 mg total) by mouth 2 (two) times daily. (Patient not taking: Reported on 10/01/2015) 20 tablet 0   No current facility-administered medications for this encounter.     REVIEW OF SYSTEMS:  A 15 point review of systems is documented in the electronic medical record. This was obtained by the nursing staff. However, I reviewed this with the patient to discuss relevant findings and make appropriate changes.  Pertinent items are noted in HPI.   Reports a "different feeling" pertaining to her head. Denies facial weakness or ringing of the ears. She reports mild, intermittent, headaches. She also reports left hand and left foot numbness. Pt has pain across her shoulder and down her back to the sacrum, 7/10 scale, and it just started a few days ago.  PHYSICAL EXAM:  weight is 143 lb 1.6 oz (64.91 kg). Her oral temperature is 98 F (36.7 C). Her blood pressure is 110/84 and her pulse is 89. Her respiration is 16 and oxygen saturation is 100%.    General: Alert and oriented, in no acute distress HEENT: Head is normocephalic. Extraocular movements are intact. Oropharynx is clear. Heart: Regular in rate and rhythm with no murmurs, rubs, or gallops. Chest: Clear to auscultation bilaterally, with no rhonchi, wheezes, or rales. Extremities: No cyanosis or edema. Skin: No concerning lesions. Musculoskeletal: symmetric strength and muscle tone throughout. Neurologic: Cranial nerves II through XII are grossly intact. No obvious focalities. Speech is fluent. Coordination is intact. Psychiatric: Judgment and insight are intact. Affect is appropriate.  ECOG = 0  0 - Asymptomatic (Fully active, able to carry on all predisease activities without restriction)  1 - Symptomatic but completely ambulatory (Restricted in physically strenuous activity but ambulatory and able to carry out work of a light or sedentary nature. For example, light housework, office work)  2 - Symptomatic, <50% in bed  during the day (Ambulatory and capable of all self care but unable to carry out any work activities. Up and about more than 50% of waking hours)  3 - Symptomatic, >50% in bed, but not bedbound (Capable of only limited self-care, confined to bed or chair 50% or more of waking hours)  4 - Bedbound (Completely disabled. Cannot carry on any self-care. Totally confined to bed or chair)  5 - Death   Eustace Pen MM, Creech RH, Tormey DC, et al. [phone]). "Toxicity and response criteria of the [facility]". [place] Oncol. 5 (6): 649-55     LABORATORY DATA:  Lab Results  Component Value Date   WBC 7.7 09/22/2015   HGB 12.4 09/22/2015   HCT 38.1 09/22/2015   MCV 89.0 09/22/2015   PLT 201 09/22/2015   Lab Results  Component Value Date   NA 139 09/22/2015   K 4.1 09/22/2015   CL 100* 09/22/2015   CO2 28 09/22/2015   Lab Results  Component Value Date   ALT 26 09/22/2015  AST 28 09/22/2015   ALKPHOS 60 09/22/2015   BILITOT 0.6 09/22/2015      RADIOGRAPHY: Dg Chest 2 View  09/22/2015  CLINICAL DATA:  Acute onset of cough, wheezing, congestion and mid chest tightness. Initial encounter. EXAM: CHEST  2 VIEW COMPARISON:  Chest radiograph performed 09/22/2012 FINDINGS: The lungs are well-aerated and clear. There is no evidence of focal opacification, pleural effusion or pneumothorax. The heart is normal in size; the mediastinal contour is within normal limits. No acute osseous abnormalities are seen. IMPRESSION: No acute cardiopulmonary process seen. Electronically Signed   By: Garald Balding M.D.   On: 09/22/2015 05:32    MRI Inner Auditory Canals Posterior Fossa Wo W Contrast (08/25/2015 2:15 PM)  Narrative  [id number] Attending [id number] Trula Ore  [id number] Ordering [id number] RUNHEIM  Date of Birth:[birth year]Sex: F  Admit Date:08/25/2015 14:01   ###FINAL RESULT###       INDICATIONS: 5MM LESION, ABNORMAL MRI  COMMENTS:        [id number]- MRI BRAIN/HEAD WO W/CONT [facility] - Aug 25 2015   [place] Accession #: [id number] Da[place] Accession #: [id number]          MRI internal artery canals with contrast:    INDICATION: Abnormality in left internal auditory canal on previous   unenhanced MRI of the brain.    TECHNIQUE: 3 mm thick axial T1-weighted images were performed through the   posterior fossa and internal auditory canals before and after intravenous   injection of 12 mL of MultiHance. Sagittal T1-weighted sequences were   performed through the entire head without contrast. Additional   high-resolution axial T2-weighted sequences performed through the   internal auditory canals. Diffusion weighted images performed through the   entire head.    COMPARISON: Unenhanced MRI of the brain 08/11/2015.    FINDINGS: 4 x 5 mm enhancing mass in the left internal auditory canal   consistent with intracanalicular acoustic schwannoma. No additional areas   of abnormal enhancement. The dural sinuses enhance normally.       IMPRESSION: 4 x 5 mm mass left internal auditory canal consistent with   intracanalicular acoustic schwannoma.             ___________________________________________________________  Current Report Read [id number] on Oct 24 20163:14P  Transcribed byJari Pigg on Oct 24 20163:17P  Electronically Signed by:DR. Remi Deter on:Oct 24 [id number]    IMPRESSION: Left acoustic neuroma found on recent MRI scan of the brain. This is a small lesion with the patient asymptomatic at this time. We discussed radiosurgery now vs continued follow up. The pros and cons of each approach were discussed in detail. All of the patient's questions were answered. She indicated today a strong preference to hold off on any treatment currently but rather to undergo a repeat MRI scan in 4-6 months to gather more information about the potential  growth rate of this lesion.   PLAN: At this time, the patient prefers continued follow up. We will schedule an MRI scan of the brain in 4 months and follow up then.     Jodelle Gross, MD, PhD  This document serves as a record of services personally performed by Kyung Rudd, MD. It was created on his behalf by Darcus Austin, a trained medical scribe. The creation of this record is based on the scribe's personal observations and the provider's statements to them. This document has been checked and approved by the attending provider.

## 2015-10-01 NOTE — Progress Notes (Signed)
Please see the Nurse Progress Note in the MD Initial Consult Encounter for this patient. 

## 2015-10-03 ENCOUNTER — Encounter: Payer: Self-pay | Admitting: Radiation Oncology

## 2015-10-03 DIAGNOSIS — D333 Benign neoplasm of cranial nerves: Secondary | ICD-10-CM | POA: Insufficient documentation

## 2015-10-07 ENCOUNTER — Other Ambulatory Visit: Payer: Self-pay | Admitting: Internal Medicine

## 2015-10-07 DIAGNOSIS — M4802 Spinal stenosis, cervical region: Secondary | ICD-10-CM

## 2015-10-08 DIAGNOSIS — G5601 Carpal tunnel syndrome, right upper limb: Secondary | ICD-10-CM | POA: Diagnosis not present

## 2015-10-20 DIAGNOSIS — N182 Chronic kidney disease, stage 2 (mild): Secondary | ICD-10-CM | POA: Diagnosis not present

## 2015-10-20 DIAGNOSIS — M6702 Short Achilles tendon (acquired), left ankle: Secondary | ICD-10-CM | POA: Diagnosis not present

## 2015-10-20 DIAGNOSIS — I130 Hypertensive heart and chronic kidney disease with heart failure and stage 1 through stage 4 chronic kidney disease, or unspecified chronic kidney disease: Secondary | ICD-10-CM | POA: Diagnosis not present

## 2015-10-20 DIAGNOSIS — M71372 Other bursal cyst, left ankle and foot: Secondary | ICD-10-CM | POA: Diagnosis not present

## 2015-10-20 DIAGNOSIS — Z79899 Other long term (current) drug therapy: Secondary | ICD-10-CM | POA: Diagnosis not present

## 2015-10-20 DIAGNOSIS — E1122 Type 2 diabetes mellitus with diabetic chronic kidney disease: Secondary | ICD-10-CM | POA: Diagnosis not present

## 2015-10-20 DIAGNOSIS — E119 Type 2 diabetes mellitus without complications: Secondary | ICD-10-CM | POA: Diagnosis not present

## 2015-10-20 DIAGNOSIS — N08 Glomerular disorders in diseases classified elsewhere: Secondary | ICD-10-CM | POA: Diagnosis not present

## 2015-10-21 ENCOUNTER — Ambulatory Visit
Admission: RE | Admit: 2015-10-21 | Discharge: 2015-10-21 | Disposition: A | Discharge status: Home / Self Care | Payer: Medicare Other | Source: Ambulatory Visit | Attending: Internal Medicine | Admitting: Internal Medicine

## 2015-10-21 DIAGNOSIS — M4802 Spinal stenosis, cervical region: Secondary | ICD-10-CM

## 2015-10-21 DIAGNOSIS — M50323 Other cervical disc degeneration at C6-C7 level: Secondary | ICD-10-CM | POA: Diagnosis not present

## 2015-10-21 DIAGNOSIS — M50322 Other cervical disc degeneration at C5-C6 level: Secondary | ICD-10-CM | POA: Diagnosis not present

## 2015-10-29 DIAGNOSIS — M25562 Pain in left knee: Secondary | ICD-10-CM | POA: Diagnosis not present

## 2015-10-30 ENCOUNTER — Other Ambulatory Visit: Payer: Self-pay | Admitting: Family Medicine

## 2015-10-30 DIAGNOSIS — M25562 Pain in left knee: Secondary | ICD-10-CM

## 2015-11-04 DIAGNOSIS — M67472 Ganglion, left ankle and foot: Secondary | ICD-10-CM | POA: Diagnosis not present

## 2015-11-04 DIAGNOSIS — L729 Follicular cyst of the skin and subcutaneous tissue, unspecified: Secondary | ICD-10-CM | POA: Diagnosis not present

## 2015-11-04 DIAGNOSIS — M216X2 Other acquired deformities of left foot: Secondary | ICD-10-CM | POA: Diagnosis not present

## 2015-11-04 DIAGNOSIS — M6702 Short Achilles tendon (acquired), left ankle: Secondary | ICD-10-CM | POA: Diagnosis not present

## 2015-11-04 DIAGNOSIS — M722 Plantar fascial fibromatosis: Secondary | ICD-10-CM | POA: Diagnosis not present

## 2015-11-04 DIAGNOSIS — K219 Gastro-esophageal reflux disease without esophagitis: Secondary | ICD-10-CM | POA: Diagnosis not present

## 2015-11-04 DIAGNOSIS — M71372 Other bursal cyst, left ankle and foot: Secondary | ICD-10-CM | POA: Diagnosis not present

## 2015-11-07 DIAGNOSIS — G5601 Carpal tunnel syndrome, right upper limb: Secondary | ICD-10-CM | POA: Diagnosis not present

## 2015-11-10 ENCOUNTER — Ambulatory Visit
Admission: RE | Admit: 2015-11-10 | Discharge: 2015-11-10 | Disposition: A | Discharge status: Home / Self Care | Payer: Medicare Other | Source: Ambulatory Visit | Attending: Family Medicine | Admitting: Family Medicine

## 2015-11-10 DIAGNOSIS — M25562 Pain in left knee: Secondary | ICD-10-CM

## 2015-11-12 DIAGNOSIS — M65872 Other synovitis and tenosynovitis, left ankle and foot: Secondary | ICD-10-CM | POA: Diagnosis not present

## 2015-11-19 DIAGNOSIS — M12272 Villonodular synovitis (pigmented), left ankle and foot: Secondary | ICD-10-CM | POA: Diagnosis not present

## 2015-11-24 DIAGNOSIS — M25562 Pain in left knee: Secondary | ICD-10-CM | POA: Diagnosis not present

## 2015-11-24 DIAGNOSIS — R262 Difficulty in walking, not elsewhere classified: Secondary | ICD-10-CM | POA: Diagnosis not present

## 2015-11-24 DIAGNOSIS — S83512D Sprain of anterior cruciate ligament of left knee, subsequent encounter: Secondary | ICD-10-CM | POA: Diagnosis not present

## 2015-11-24 DIAGNOSIS — M6281 Muscle weakness (generalized): Secondary | ICD-10-CM | POA: Diagnosis not present

## 2015-12-01 DIAGNOSIS — I119 Hypertensive heart disease without heart failure: Secondary | ICD-10-CM | POA: Diagnosis not present

## 2015-12-01 DIAGNOSIS — M7541 Impingement syndrome of right shoulder: Secondary | ICD-10-CM | POA: Diagnosis not present

## 2015-12-01 DIAGNOSIS — E119 Type 2 diabetes mellitus without complications: Secondary | ICD-10-CM | POA: Diagnosis not present

## 2015-12-01 DIAGNOSIS — E785 Hyperlipidemia, unspecified: Secondary | ICD-10-CM | POA: Diagnosis not present

## 2015-12-01 DIAGNOSIS — M25511 Pain in right shoulder: Secondary | ICD-10-CM | POA: Diagnosis not present

## 2015-12-01 DIAGNOSIS — I420 Dilated cardiomyopathy: Secondary | ICD-10-CM | POA: Diagnosis not present

## 2015-12-01 DIAGNOSIS — R0789 Other chest pain: Secondary | ICD-10-CM | POA: Diagnosis not present

## 2015-12-03 DIAGNOSIS — L910 Hypertrophic scar: Secondary | ICD-10-CM | POA: Diagnosis not present

## 2015-12-03 DIAGNOSIS — M6281 Muscle weakness (generalized): Secondary | ICD-10-CM | POA: Diagnosis not present

## 2015-12-03 DIAGNOSIS — M79605 Pain in left leg: Secondary | ICD-10-CM | POA: Diagnosis not present

## 2015-12-05 DIAGNOSIS — M6281 Muscle weakness (generalized): Secondary | ICD-10-CM | POA: Diagnosis not present

## 2015-12-05 DIAGNOSIS — M79605 Pain in left leg: Secondary | ICD-10-CM | POA: Diagnosis not present

## 2015-12-05 DIAGNOSIS — L910 Hypertrophic scar: Secondary | ICD-10-CM | POA: Diagnosis not present

## 2015-12-08 DIAGNOSIS — M25511 Pain in right shoulder: Secondary | ICD-10-CM | POA: Diagnosis not present

## 2015-12-08 DIAGNOSIS — M7541 Impingement syndrome of right shoulder: Secondary | ICD-10-CM | POA: Diagnosis not present

## 2015-12-10 DIAGNOSIS — M79605 Pain in left leg: Secondary | ICD-10-CM | POA: Diagnosis not present

## 2015-12-10 DIAGNOSIS — L910 Hypertrophic scar: Secondary | ICD-10-CM | POA: Diagnosis not present

## 2015-12-10 DIAGNOSIS — M6281 Muscle weakness (generalized): Secondary | ICD-10-CM | POA: Diagnosis not present

## 2015-12-12 DIAGNOSIS — M25562 Pain in left knee: Secondary | ICD-10-CM | POA: Diagnosis not present

## 2015-12-12 DIAGNOSIS — M25462 Effusion, left knee: Secondary | ICD-10-CM | POA: Diagnosis not present

## 2015-12-15 DIAGNOSIS — M6281 Muscle weakness (generalized): Secondary | ICD-10-CM | POA: Diagnosis not present

## 2015-12-15 DIAGNOSIS — M25562 Pain in left knee: Secondary | ICD-10-CM | POA: Diagnosis not present

## 2015-12-15 DIAGNOSIS — S83512D Sprain of anterior cruciate ligament of left knee, subsequent encounter: Secondary | ICD-10-CM | POA: Diagnosis not present

## 2015-12-15 DIAGNOSIS — R262 Difficulty in walking, not elsewhere classified: Secondary | ICD-10-CM | POA: Diagnosis not present

## 2015-12-17 DIAGNOSIS — M12272 Villonodular synovitis (pigmented), left ankle and foot: Secondary | ICD-10-CM | POA: Diagnosis not present

## 2015-12-18 DIAGNOSIS — S83511A Sprain of anterior cruciate ligament of right knee, initial encounter: Secondary | ICD-10-CM | POA: Diagnosis not present

## 2015-12-18 DIAGNOSIS — M25462 Effusion, left knee: Secondary | ICD-10-CM | POA: Diagnosis not present

## 2015-12-18 DIAGNOSIS — S83207A Unspecified tear of unspecified meniscus, current injury, left knee, initial encounter: Secondary | ICD-10-CM | POA: Diagnosis not present

## 2015-12-22 DIAGNOSIS — L910 Hypertrophic scar: Secondary | ICD-10-CM | POA: Diagnosis not present

## 2015-12-22 DIAGNOSIS — M79605 Pain in left leg: Secondary | ICD-10-CM | POA: Diagnosis not present

## 2015-12-22 DIAGNOSIS — M6281 Muscle weakness (generalized): Secondary | ICD-10-CM | POA: Diagnosis not present

## 2015-12-29 DIAGNOSIS — M25562 Pain in left knee: Secondary | ICD-10-CM | POA: Diagnosis not present

## 2015-12-29 DIAGNOSIS — R262 Difficulty in walking, not elsewhere classified: Secondary | ICD-10-CM | POA: Diagnosis not present

## 2015-12-29 DIAGNOSIS — S83512D Sprain of anterior cruciate ligament of left knee, subsequent encounter: Secondary | ICD-10-CM | POA: Diagnosis not present

## 2015-12-29 DIAGNOSIS — M6281 Muscle weakness (generalized): Secondary | ICD-10-CM | POA: Diagnosis not present

## 2016-01-05 DIAGNOSIS — R262 Difficulty in walking, not elsewhere classified: Secondary | ICD-10-CM | POA: Diagnosis not present

## 2016-01-05 DIAGNOSIS — M6281 Muscle weakness (generalized): Secondary | ICD-10-CM | POA: Diagnosis not present

## 2016-01-05 DIAGNOSIS — S83512D Sprain of anterior cruciate ligament of left knee, subsequent encounter: Secondary | ICD-10-CM | POA: Diagnosis not present

## 2016-01-05 DIAGNOSIS — M25562 Pain in left knee: Secondary | ICD-10-CM | POA: Diagnosis not present

## 2016-01-12 DIAGNOSIS — L910 Hypertrophic scar: Secondary | ICD-10-CM | POA: Diagnosis not present

## 2016-01-12 DIAGNOSIS — M79605 Pain in left leg: Secondary | ICD-10-CM | POA: Diagnosis not present

## 2016-01-12 DIAGNOSIS — M6281 Muscle weakness (generalized): Secondary | ICD-10-CM | POA: Diagnosis not present

## 2016-01-19 DIAGNOSIS — M25562 Pain in left knee: Secondary | ICD-10-CM | POA: Diagnosis not present

## 2016-01-19 DIAGNOSIS — R262 Difficulty in walking, not elsewhere classified: Secondary | ICD-10-CM | POA: Diagnosis not present

## 2016-01-19 DIAGNOSIS — S83512D Sprain of anterior cruciate ligament of left knee, subsequent encounter: Secondary | ICD-10-CM | POA: Diagnosis not present

## 2016-01-19 DIAGNOSIS — M6281 Muscle weakness (generalized): Secondary | ICD-10-CM | POA: Diagnosis not present

## 2016-01-21 DIAGNOSIS — L03031 Cellulitis of right toe: Secondary | ICD-10-CM | POA: Diagnosis not present

## 2016-01-21 DIAGNOSIS — M722 Plantar fascial fibromatosis: Secondary | ICD-10-CM | POA: Diagnosis not present

## 2016-02-02 DIAGNOSIS — G5601 Carpal tunnel syndrome, right upper limb: Secondary | ICD-10-CM | POA: Diagnosis not present

## 2016-02-09 DIAGNOSIS — M6281 Muscle weakness (generalized): Secondary | ICD-10-CM | POA: Diagnosis not present

## 2016-02-09 DIAGNOSIS — S83512D Sprain of anterior cruciate ligament of left knee, subsequent encounter: Secondary | ICD-10-CM | POA: Diagnosis not present

## 2016-02-09 DIAGNOSIS — M25562 Pain in left knee: Secondary | ICD-10-CM | POA: Diagnosis not present

## 2016-02-09 DIAGNOSIS — R262 Difficulty in walking, not elsewhere classified: Secondary | ICD-10-CM | POA: Diagnosis not present

## 2016-02-09 DIAGNOSIS — L03031 Cellulitis of right toe: Secondary | ICD-10-CM | POA: Diagnosis not present

## 2016-02-12 DIAGNOSIS — N182 Chronic kidney disease, stage 2 (mild): Secondary | ICD-10-CM | POA: Diagnosis not present

## 2016-02-12 DIAGNOSIS — N08 Glomerular disorders in diseases classified elsewhere: Secondary | ICD-10-CM | POA: Diagnosis not present

## 2016-02-12 DIAGNOSIS — E1122 Type 2 diabetes mellitus with diabetic chronic kidney disease: Secondary | ICD-10-CM | POA: Diagnosis not present

## 2016-02-12 DIAGNOSIS — I130 Hypertensive heart and chronic kidney disease with heart failure and stage 1 through stage 4 chronic kidney disease, or unspecified chronic kidney disease: Secondary | ICD-10-CM | POA: Diagnosis not present

## 2016-02-16 DIAGNOSIS — S83512D Sprain of anterior cruciate ligament of left knee, subsequent encounter: Secondary | ICD-10-CM | POA: Diagnosis not present

## 2016-02-16 DIAGNOSIS — M25562 Pain in left knee: Secondary | ICD-10-CM | POA: Diagnosis not present

## 2016-02-23 ENCOUNTER — Other Ambulatory Visit: Payer: Self-pay | Admitting: Radiation Therapy

## 2016-02-23 ENCOUNTER — Telehealth: Payer: Self-pay | Admitting: Radiation Therapy

## 2016-02-23 DIAGNOSIS — D333 Benign neoplasm of cranial nerves: Secondary | ICD-10-CM

## 2016-02-23 DIAGNOSIS — T8189XA Other complications of procedures, not elsewhere classified, initial encounter: Secondary | ICD-10-CM | POA: Diagnosis not present

## 2016-02-23 NOTE — Telephone Encounter (Signed)
I spoke with [name] to remind her about her upcoming appointments for an MRI and to see Dr. Lisbeth Renshaw. She confirmed the appointments and was thankful for the call.   MRI 4/27 See Dr. Lisbeth Renshaw for discussion of possible SRS to Acoustic Neuroma on 5/1  Mont Dutton R.T.(R)(T) Special Procedures Navigator

## 2016-02-25 DIAGNOSIS — M79605 Pain in left leg: Secondary | ICD-10-CM | POA: Diagnosis not present

## 2016-02-25 DIAGNOSIS — M6281 Muscle weakness (generalized): Secondary | ICD-10-CM | POA: Diagnosis not present

## 2016-02-25 DIAGNOSIS — L910 Hypertrophic scar: Secondary | ICD-10-CM | POA: Diagnosis not present

## 2016-02-26 ENCOUNTER — Other Ambulatory Visit: Payer: Medicare Other

## 2016-03-01 ENCOUNTER — Ambulatory Visit: Payer: Medicare Other

## 2016-03-01 ENCOUNTER — Ambulatory Visit: Payer: Medicare Other | Admitting: Radiation Oncology

## 2016-03-02 DIAGNOSIS — E559 Vitamin D deficiency, unspecified: Secondary | ICD-10-CM | POA: Diagnosis not present

## 2016-03-02 DIAGNOSIS — G629 Polyneuropathy, unspecified: Secondary | ICD-10-CM | POA: Diagnosis not present

## 2016-03-03 DIAGNOSIS — I119 Hypertensive heart disease without heart failure: Secondary | ICD-10-CM | POA: Diagnosis not present

## 2016-03-03 DIAGNOSIS — I420 Dilated cardiomyopathy: Secondary | ICD-10-CM | POA: Diagnosis not present

## 2016-03-03 DIAGNOSIS — E785 Hyperlipidemia, unspecified: Secondary | ICD-10-CM | POA: Diagnosis not present

## 2016-03-03 DIAGNOSIS — E119 Type 2 diabetes mellitus without complications: Secondary | ICD-10-CM | POA: Diagnosis not present

## 2016-03-08 ENCOUNTER — Ambulatory Visit: Payer: Medicare Other | Admitting: Radiation Oncology

## 2016-03-10 ENCOUNTER — Ambulatory Visit
Admission: RE | Admit: 2016-03-10 | Discharge: 2016-03-10 | Disposition: A | Discharge status: Home / Self Care | Payer: Medicare Other | Source: Ambulatory Visit | Attending: Radiation Oncology | Admitting: Radiation Oncology

## 2016-03-10 DIAGNOSIS — H04123 Dry eye syndrome of bilateral lacrimal glands: Secondary | ICD-10-CM | POA: Diagnosis not present

## 2016-03-10 DIAGNOSIS — D333 Benign neoplasm of cranial nerves: Secondary | ICD-10-CM | POA: Diagnosis not present

## 2016-03-10 DIAGNOSIS — H31092 Other chorioretinal scars, left eye: Secondary | ICD-10-CM | POA: Diagnosis not present

## 2016-03-10 DIAGNOSIS — H2512 Age-related nuclear cataract, left eye: Secondary | ICD-10-CM | POA: Diagnosis not present

## 2016-03-10 DIAGNOSIS — H10413 Chronic giant papillary conjunctivitis, bilateral: Secondary | ICD-10-CM | POA: Diagnosis not present

## 2016-03-10 DIAGNOSIS — H25811 Combined forms of age-related cataract, right eye: Secondary | ICD-10-CM | POA: Diagnosis not present

## 2016-03-10 MED ORDER — GADOBENATE DIMEGLUMINE 529 MG/ML IV SOLN
13.0000 mL | Freq: Once | INTRAVENOUS | Status: AC | PRN
  Administered 2016-03-10: 13 mL via INTRAVENOUS

## 2016-03-11 NOTE — Progress Notes (Signed)
Location/Histology of Brain Tumor:  Acoustic Neuroma,Left  Patient presented with symptoms of:  Memory loss   Past or anticipated interventions, if any, per neurosurgery: Dr. Loman Chroman   Past or anticipated interventions, if any, per medical oncology:   Dose of Decadron, if applicable:   Recent neurologic symptoms, if any:   Seizures: No  Headaches: yes, right frontal ,none at present  Nausea: No  Dizziness/ataxia: No  Difficulty with hand coordination: No  Focal numbness/weakness: no  Visual deficits/changes: mild says viewing,   Confusion/Memory deficits: memory loss and concentration problems  Painful bone metastases at present, if any: lower back pain  SAFETY ISSUES:  Prior radiation? NO  Pacemaker/ICD? NO  Possible current pregnancy? NO Is the patient on methotrexate?NO Additional Complaints / other details: Dr. Lisbeth Renshaw seen patient 10/01/15 Patient preferred  To follow up BP 96/62 mmHg  Pulse 72  Temp(Src) 98.2 F (36.8 C) (Oral)  Resp 16  Ht 5' 5.5" (1.664 m)  Wt 143 lb 11.2 oz (65.182 kg)  BMI 23.54 kg/m2  SpO2 100%  Wt Readings from Last 3 Encounters:  03/15/16 143 lb 11.2 oz (65.182 kg)  11/10/15 143 lb (64.864 kg)  10/01/15 143 lb 1.6 oz (64.91 kg)

## 2016-03-15 ENCOUNTER — Ambulatory Visit
Admission: RE | Admit: 2016-03-15 | Discharge: 2016-03-15 | Disposition: A | Discharge status: Home / Self Care | Payer: Medicare Other | Source: Ambulatory Visit | Attending: Radiation Oncology | Admitting: Radiation Oncology

## 2016-03-15 ENCOUNTER — Encounter: Payer: Self-pay | Admitting: Radiation Oncology

## 2016-03-15 VITALS — BP 96/62 | HR 72 | Temp 98.2°F | Resp 16 | Ht 65.5 in | Wt 143.7 lb

## 2016-03-15 DIAGNOSIS — D333 Benign neoplasm of cranial nerves: Secondary | ICD-10-CM | POA: Diagnosis not present

## 2016-03-15 DIAGNOSIS — M792 Neuralgia and neuritis, unspecified: Secondary | ICD-10-CM | POA: Diagnosis not present

## 2016-03-15 NOTE — Progress Notes (Signed)
Radiation Oncology         [phone] ________________________________  Name: [name] MRN: [id number]  Date: 03/15/2016  DOB: [birth year]  Follow-Up Visit Note  CC: Cassie Greenland, MD  Cassie Lose, MD  Diagnosis:   Acoustic Neuroma  Narrative:  The patient returns today for routine follow-up. She mentions that she has not felt any tingling on the left side of her head. She denies seizures. She mentions she does have headaches in the right frontal area. She does not currently have a headache. She denies nausea, dizziness, difficulty with coordination, or numbness. She mentions that she does have memory and concentration problems. She mentions that she has some lower back pain.  ALLERGIES:  is allergic to codeine.  Meds: Current Outpatient Prescriptions  Medication Sig Dispense Refill  . aspirin 81 MG EC tablet Take 81 mg by mouth daily. Swallow whole.    Marland Kitchen atorvastatin (LIPITOR) 20 MG tablet Take 20 mg by mouth daily.    . carvedilol (COREG) 25 MG tablet Take 25 mg by mouth 2 (two) times daily with a meal.     . folic acid (FOLVITE) 1 MG tablet Take 1 mg by mouth daily.    . furosemide (LASIX) 40 MG tablet Take 1 tablet (40 mg total) by mouth daily. 30 tablet 0  . gabapentin (NEURONTIN) 600 MG tablet Take 600 mg by mouth at bedtime.    Marland Kitchen lisinopril (PRINIVIL,ZESTRIL) 20 MG tablet Take 20 mg by mouth daily.    . Magnesium Oxide (MAG-200 PO) Take 200 mg by mouth at bedtime.    . metFORMIN (GLUCOPHAGE-XR) 500 MG 24 hr tablet Take 500 mg by mouth 2 (two) times daily.    . methocarbamol (ROBAXIN) 500 MG tablet Take 500 mg by mouth 2 (two) times daily as needed for muscle spasms.    Marland Kitchen omeprazole (PRILOSEC) 40 MG capsule Take 40 mg by mouth daily.    . Potassium 99 MG TABS Take 99 mg by mouth daily.    Marland Kitchen pyridOXINE (VITAMIN B-6) 100 MG tablet Take 100 mg by mouth daily.    Marland Kitchen spironolactone (ALDACTONE) 25 MG tablet Take 25 mg by mouth 2 (two) times daily.    .  chlorpheniramine-HYDROcodone (TUSSIONEX PENNKINETIC ER) 10-8 MG/5ML SUER Take 5 mLs by mouth every 12 (twelve) hours as needed for cough. (Patient not taking: Reported on 10/01/2015) 140 mL 0  . meloxicam (MOBIC) 15 MG tablet Take 15 mg by mouth daily as needed. Reported on 03/15/2016    . predniSONE (DELTASONE) 10 MG tablet Take 2 tablets (20 mg total) by mouth 2 (two) times daily. (Patient not taking: Reported on 10/01/2015) 20 tablet 0   No current facility-administered medications for this encounter.    Physical Findings: The patient is in no acute distress. Patient is alert and oriented.  height is 5' 5.5" (1.664 m) and weight is 143 lb 11.2 oz (65.182 kg). Her oral temperature is 98.2 F (36.8 C). Her blood pressure is 96/62 and her pulse is 72. Her respiration is 16 and oxygen saturation is 100%. .   Lab Findings: Lab Results  Component Value Date   WBC 7.7 09/22/2015   HGB 12.4 09/22/2015   HCT 38.1 09/22/2015   MCV 89.0 09/22/2015   PLT 201 09/22/2015     Radiographic Findings: Mr [name] [id number] Contrast  03/10/2016  CLINICAL DATA:  Acoustic neuroma. SRS protocol for treatment planning. Creatinine was obtained on site at [place] at [address]  Results: Creatinine 1.0 mg/dL. EXAM: MRI HEAD WITHOUT AND WITH CONTRAST TECHNIQUE: Multiplanar, multiecho pulse sequences of the brain and surrounding structures were obtained without and with intravenous contrast. CONTRAST:  58mL MULTIHANCE GADOBENATE DIMEGLUMINE 529 MG/ML IV SOLN COMPARISON:  Outside IAC protocol MRI 08/25/2015 from [place]: There is no evidence of acute infarct, intracranial hemorrhage, intra-axial mass, midline shift, or extra-axial fluid collection. Ventricles and sulci are normal. Small foci of T2 hyperintensity scattered throughout the cerebral white matter bilaterally are mildly advanced for age and nonspecific but compatible with chronic small vessel ischemic disease. No abnormal  brain parenchymal enhancement is identified. Orbits are unremarkable. Paranasal sinuses and mastoid air cells are clear. Major intracranial vascular flow voids are preserved. Dedicated imaging through the internal auditory canals demonstrates a normal course of cranial nerves VII and VIII on the right. An enhancing mass in the left internal auditory canal measures 6 x 4 x 4 mm, similar in size to the prior motion degraded examination. The mass is completely intra canalicular, without extension into the cerebellopontine angle cistern and without extension to the fundus of the IAC. Inner ear structures demonstrate normal signal bilaterally. IMPRESSION: 1. 6 mm left IAC mass consistent with vestibular schwannoma in without significant interval size change. 2. Mild chronic small vessel ischemic disease. Electronically Signed   By: [name] M.D.   On: 03/10/2016 14:20    Impression:    [name] is a 55 yo woman with a left acoustic neuroma.  Plan:  She will follow up with me in a year and will repeat MRI before that visit.  The patient was seen today for 15 minutes, with the majority of the time spent counseling the patient on his diagnosis of cancer and coordinating his care.     ------------------------------------------------  Cassie Gross, MD, PhD    This document serves as a record of services personally performed by Cassie Rudd, MD. It was created on his behalf by  [name], a trained medical scribe. The creation of this record is based on the scribe's personal observations and the provider's statements to them. This document has been checked and approved by the attending provider.

## 2016-03-19 DIAGNOSIS — G8918 Other acute postprocedural pain: Secondary | ICD-10-CM | POA: Diagnosis not present

## 2016-03-19 DIAGNOSIS — S83242A Other tear of medial meniscus, current injury, left knee, initial encounter: Secondary | ICD-10-CM | POA: Diagnosis not present

## 2016-03-19 DIAGNOSIS — M94262 Chondromalacia, left knee: Secondary | ICD-10-CM | POA: Diagnosis not present

## 2016-03-19 DIAGNOSIS — S83282A Other tear of lateral meniscus, current injury, left knee, initial encounter: Secondary | ICD-10-CM | POA: Diagnosis not present

## 2016-03-19 DIAGNOSIS — S83512A Sprain of anterior cruciate ligament of left knee, initial encounter: Secondary | ICD-10-CM | POA: Diagnosis not present

## 2016-03-22 DIAGNOSIS — M25462 Effusion, left knee: Secondary | ICD-10-CM | POA: Diagnosis not present

## 2016-03-22 DIAGNOSIS — Z9889 Other specified postprocedural states: Secondary | ICD-10-CM | POA: Diagnosis not present

## 2016-03-22 DIAGNOSIS — M25562 Pain in left knee: Secondary | ICD-10-CM | POA: Diagnosis not present

## 2016-03-22 DIAGNOSIS — R262 Difficulty in walking, not elsewhere classified: Secondary | ICD-10-CM | POA: Diagnosis not present

## 2016-03-22 DIAGNOSIS — M25662 Stiffness of left knee, not elsewhere classified: Secondary | ICD-10-CM | POA: Diagnosis not present

## 2016-03-25 DIAGNOSIS — M25462 Effusion, left knee: Secondary | ICD-10-CM | POA: Diagnosis not present

## 2016-03-31 DIAGNOSIS — M25562 Pain in left knee: Secondary | ICD-10-CM | POA: Diagnosis not present

## 2016-03-31 DIAGNOSIS — M25462 Effusion, left knee: Secondary | ICD-10-CM | POA: Diagnosis not present

## 2016-03-31 DIAGNOSIS — R262 Difficulty in walking, not elsewhere classified: Secondary | ICD-10-CM | POA: Diagnosis not present

## 2016-03-31 DIAGNOSIS — M25662 Stiffness of left knee, not elsewhere classified: Secondary | ICD-10-CM | POA: Diagnosis not present

## 2016-04-01 DIAGNOSIS — M25462 Effusion, left knee: Secondary | ICD-10-CM | POA: Diagnosis not present

## 2016-04-02 DIAGNOSIS — R262 Difficulty in walking, not elsewhere classified: Secondary | ICD-10-CM | POA: Diagnosis not present

## 2016-04-02 DIAGNOSIS — M25462 Effusion, left knee: Secondary | ICD-10-CM | POA: Diagnosis not present

## 2016-04-02 DIAGNOSIS — M25562 Pain in left knee: Secondary | ICD-10-CM | POA: Diagnosis not present

## 2016-04-02 DIAGNOSIS — M25662 Stiffness of left knee, not elsewhere classified: Secondary | ICD-10-CM | POA: Diagnosis not present

## 2016-04-05 DIAGNOSIS — M25562 Pain in left knee: Secondary | ICD-10-CM | POA: Diagnosis not present

## 2016-04-05 DIAGNOSIS — M25662 Stiffness of left knee, not elsewhere classified: Secondary | ICD-10-CM | POA: Diagnosis not present

## 2016-04-05 DIAGNOSIS — M25462 Effusion, left knee: Secondary | ICD-10-CM | POA: Diagnosis not present

## 2016-04-05 DIAGNOSIS — R262 Difficulty in walking, not elsewhere classified: Secondary | ICD-10-CM | POA: Diagnosis not present

## 2016-04-07 DIAGNOSIS — M25662 Stiffness of left knee, not elsewhere classified: Secondary | ICD-10-CM | POA: Diagnosis not present

## 2016-04-07 DIAGNOSIS — M25562 Pain in left knee: Secondary | ICD-10-CM | POA: Diagnosis not present

## 2016-04-07 DIAGNOSIS — R262 Difficulty in walking, not elsewhere classified: Secondary | ICD-10-CM | POA: Diagnosis not present

## 2016-04-07 DIAGNOSIS — M25462 Effusion, left knee: Secondary | ICD-10-CM | POA: Diagnosis not present

## 2016-04-12 DIAGNOSIS — M25662 Stiffness of left knee, not elsewhere classified: Secondary | ICD-10-CM | POA: Diagnosis not present

## 2016-04-12 DIAGNOSIS — M25462 Effusion, left knee: Secondary | ICD-10-CM | POA: Diagnosis not present

## 2016-04-12 DIAGNOSIS — R262 Difficulty in walking, not elsewhere classified: Secondary | ICD-10-CM | POA: Diagnosis not present

## 2016-04-12 DIAGNOSIS — M25562 Pain in left knee: Secondary | ICD-10-CM | POA: Diagnosis not present

## 2016-04-13 DIAGNOSIS — Z9889 Other specified postprocedural states: Secondary | ICD-10-CM | POA: Diagnosis not present

## 2016-04-14 DIAGNOSIS — M25462 Effusion, left knee: Secondary | ICD-10-CM | POA: Diagnosis not present

## 2016-04-14 DIAGNOSIS — Z87891 Personal history of nicotine dependence: Secondary | ICD-10-CM | POA: Diagnosis not present

## 2016-04-14 DIAGNOSIS — Z7982 Long term (current) use of aspirin: Secondary | ICD-10-CM | POA: Diagnosis not present

## 2016-04-14 DIAGNOSIS — M25562 Pain in left knee: Secondary | ICD-10-CM | POA: Diagnosis not present

## 2016-04-14 DIAGNOSIS — R262 Difficulty in walking, not elsewhere classified: Secondary | ICD-10-CM | POA: Diagnosis not present

## 2016-04-14 DIAGNOSIS — M25662 Stiffness of left knee, not elsewhere classified: Secondary | ICD-10-CM | POA: Diagnosis not present

## 2016-04-14 DIAGNOSIS — R251 Tremor, unspecified: Secondary | ICD-10-CM | POA: Diagnosis not present

## 2016-04-19 DIAGNOSIS — R262 Difficulty in walking, not elsewhere classified: Secondary | ICD-10-CM | POA: Diagnosis not present

## 2016-04-19 DIAGNOSIS — M25462 Effusion, left knee: Secondary | ICD-10-CM | POA: Diagnosis not present

## 2016-04-19 DIAGNOSIS — M25662 Stiffness of left knee, not elsewhere classified: Secondary | ICD-10-CM | POA: Diagnosis not present

## 2016-04-19 DIAGNOSIS — M25562 Pain in left knee: Secondary | ICD-10-CM | POA: Diagnosis not present

## 2016-04-21 DIAGNOSIS — R262 Difficulty in walking, not elsewhere classified: Secondary | ICD-10-CM | POA: Diagnosis not present

## 2016-04-21 DIAGNOSIS — M25662 Stiffness of left knee, not elsewhere classified: Secondary | ICD-10-CM | POA: Diagnosis not present

## 2016-04-21 DIAGNOSIS — M25562 Pain in left knee: Secondary | ICD-10-CM | POA: Diagnosis not present

## 2016-04-21 DIAGNOSIS — M25462 Effusion, left knee: Secondary | ICD-10-CM | POA: Diagnosis not present

## 2016-04-26 DIAGNOSIS — M25562 Pain in left knee: Secondary | ICD-10-CM | POA: Diagnosis not present

## 2016-04-26 DIAGNOSIS — M25462 Effusion, left knee: Secondary | ICD-10-CM | POA: Diagnosis not present

## 2016-04-26 DIAGNOSIS — R262 Difficulty in walking, not elsewhere classified: Secondary | ICD-10-CM | POA: Diagnosis not present

## 2016-04-26 DIAGNOSIS — M25662 Stiffness of left knee, not elsewhere classified: Secondary | ICD-10-CM | POA: Diagnosis not present

## 2016-04-28 DIAGNOSIS — M25462 Effusion, left knee: Secondary | ICD-10-CM | POA: Diagnosis not present

## 2016-04-28 DIAGNOSIS — R262 Difficulty in walking, not elsewhere classified: Secondary | ICD-10-CM | POA: Diagnosis not present

## 2016-04-28 DIAGNOSIS — M25562 Pain in left knee: Secondary | ICD-10-CM | POA: Diagnosis not present

## 2016-04-28 DIAGNOSIS — M25662 Stiffness of left knee, not elsewhere classified: Secondary | ICD-10-CM | POA: Diagnosis not present

## 2016-05-03 DIAGNOSIS — M25562 Pain in left knee: Secondary | ICD-10-CM | POA: Diagnosis not present

## 2016-05-03 DIAGNOSIS — R262 Difficulty in walking, not elsewhere classified: Secondary | ICD-10-CM | POA: Diagnosis not present

## 2016-05-03 DIAGNOSIS — M25662 Stiffness of left knee, not elsewhere classified: Secondary | ICD-10-CM | POA: Diagnosis not present

## 2016-05-03 DIAGNOSIS — M25462 Effusion, left knee: Secondary | ICD-10-CM | POA: Diagnosis not present

## 2016-05-05 DIAGNOSIS — M25462 Effusion, left knee: Secondary | ICD-10-CM | POA: Diagnosis not present

## 2016-05-05 DIAGNOSIS — R262 Difficulty in walking, not elsewhere classified: Secondary | ICD-10-CM | POA: Diagnosis not present

## 2016-05-05 DIAGNOSIS — M25662 Stiffness of left knee, not elsewhere classified: Secondary | ICD-10-CM | POA: Diagnosis not present

## 2016-05-05 DIAGNOSIS — M25562 Pain in left knee: Secondary | ICD-10-CM | POA: Diagnosis not present

## 2016-05-06 DIAGNOSIS — Z9889 Other specified postprocedural states: Secondary | ICD-10-CM | POA: Diagnosis not present

## 2016-05-07 DIAGNOSIS — M7751 Other enthesopathy of right foot: Secondary | ICD-10-CM | POA: Diagnosis not present

## 2016-05-07 DIAGNOSIS — G5761 Lesion of plantar nerve, right lower limb: Secondary | ICD-10-CM | POA: Diagnosis not present

## 2016-05-11 ENCOUNTER — Ambulatory Visit (INDEPENDENT_AMBULATORY_CARE_PROVIDER_SITE_OTHER): Payer: Medicare Other | Admitting: Neurology

## 2016-05-11 ENCOUNTER — Encounter: Payer: Self-pay | Admitting: Neurology

## 2016-05-11 VITALS — BP 112/82 | HR 80 | Resp 16 | Ht 65.0 in | Wt 136.0 lb

## 2016-05-11 DIAGNOSIS — R251 Tremor, unspecified: Secondary | ICD-10-CM

## 2016-05-11 NOTE — Patient Instructions (Signed)
Please reduce your alcohol intake! We will monitor you for your tremor. Your exam is benign, no evidence of tremor at this time, no parkinsonism.  You have recent blood work and a recent brain scan too, so no need from my end of things for additional tests at this time.
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
## 2016-05-12 DIAGNOSIS — M25462 Effusion, left knee: Secondary | ICD-10-CM | POA: Diagnosis not present

## 2016-05-12 DIAGNOSIS — M25662 Stiffness of left knee, not elsewhere classified: Secondary | ICD-10-CM | POA: Diagnosis not present

## 2016-05-12 DIAGNOSIS — M25562 Pain in left knee: Secondary | ICD-10-CM | POA: Diagnosis not present

## 2016-05-12 DIAGNOSIS — R262 Difficulty in walking, not elsewhere classified: Secondary | ICD-10-CM | POA: Diagnosis not present

## 2016-05-17 DIAGNOSIS — I1310 Hypertensive heart and chronic kidney disease without heart failure, with stage 1 through stage 4 chronic kidney disease, or unspecified chronic kidney disease: Secondary | ICD-10-CM | POA: Diagnosis not present

## 2016-05-17 DIAGNOSIS — N182 Chronic kidney disease, stage 2 (mild): Secondary | ICD-10-CM | POA: Diagnosis not present

## 2016-05-17 DIAGNOSIS — E1122 Type 2 diabetes mellitus with diabetic chronic kidney disease: Secondary | ICD-10-CM | POA: Diagnosis not present

## 2016-05-17 DIAGNOSIS — N08 Glomerular disorders in diseases classified elsewhere: Secondary | ICD-10-CM | POA: Diagnosis not present

## 2016-05-19 DIAGNOSIS — R262 Difficulty in walking, not elsewhere classified: Secondary | ICD-10-CM | POA: Diagnosis not present

## 2016-05-19 DIAGNOSIS — M25662 Stiffness of left knee, not elsewhere classified: Secondary | ICD-10-CM | POA: Diagnosis not present

## 2016-05-19 DIAGNOSIS — M25562 Pain in left knee: Secondary | ICD-10-CM | POA: Diagnosis not present

## 2016-05-19 DIAGNOSIS — M25462 Effusion, left knee: Secondary | ICD-10-CM | POA: Diagnosis not present

## 2016-05-26 DIAGNOSIS — M25462 Effusion, left knee: Secondary | ICD-10-CM | POA: Diagnosis not present

## 2016-05-26 DIAGNOSIS — R262 Difficulty in walking, not elsewhere classified: Secondary | ICD-10-CM | POA: Diagnosis not present

## 2016-05-26 DIAGNOSIS — M25562 Pain in left knee: Secondary | ICD-10-CM | POA: Diagnosis not present

## 2016-05-26 DIAGNOSIS — M25662 Stiffness of left knee, not elsewhere classified: Secondary | ICD-10-CM | POA: Diagnosis not present

## 2016-05-31 DIAGNOSIS — Z9889 Other specified postprocedural states: Secondary | ICD-10-CM | POA: Diagnosis not present

## 2016-06-02 DIAGNOSIS — M25562 Pain in left knee: Secondary | ICD-10-CM | POA: Diagnosis not present

## 2016-06-02 DIAGNOSIS — M25662 Stiffness of left knee, not elsewhere classified: Secondary | ICD-10-CM | POA: Diagnosis not present

## 2016-06-02 DIAGNOSIS — R262 Difficulty in walking, not elsewhere classified: Secondary | ICD-10-CM | POA: Diagnosis not present

## 2016-06-02 DIAGNOSIS — M25462 Effusion, left knee: Secondary | ICD-10-CM | POA: Diagnosis not present

## 2016-06-09 DIAGNOSIS — E119 Type 2 diabetes mellitus without complications: Secondary | ICD-10-CM | POA: Diagnosis not present

## 2016-06-09 DIAGNOSIS — I119 Hypertensive heart disease without heart failure: Secondary | ICD-10-CM | POA: Diagnosis not present

## 2016-06-09 DIAGNOSIS — I420 Dilated cardiomyopathy: Secondary | ICD-10-CM | POA: Diagnosis not present

## 2016-06-09 DIAGNOSIS — E785 Hyperlipidemia, unspecified: Secondary | ICD-10-CM | POA: Diagnosis not present

## 2016-06-15 DIAGNOSIS — Z13820 Encounter for screening for osteoporosis: Secondary | ICD-10-CM | POA: Diagnosis not present

## 2016-06-15 DIAGNOSIS — M85852 Other specified disorders of bone density and structure, left thigh: Secondary | ICD-10-CM | POA: Diagnosis not present

## 2016-06-17 DIAGNOSIS — M25462 Effusion, left knee: Secondary | ICD-10-CM | POA: Diagnosis not present

## 2016-06-17 DIAGNOSIS — M25662 Stiffness of left knee, not elsewhere classified: Secondary | ICD-10-CM | POA: Diagnosis not present

## 2016-06-17 DIAGNOSIS — R262 Difficulty in walking, not elsewhere classified: Secondary | ICD-10-CM | POA: Diagnosis not present

## 2016-06-17 DIAGNOSIS — M25562 Pain in left knee: Secondary | ICD-10-CM | POA: Diagnosis not present

## 2016-06-30 DIAGNOSIS — M25562 Pain in left knee: Secondary | ICD-10-CM | POA: Diagnosis not present

## 2016-06-30 DIAGNOSIS — M25462 Effusion, left knee: Secondary | ICD-10-CM | POA: Diagnosis not present

## 2016-06-30 DIAGNOSIS — M25662 Stiffness of left knee, not elsewhere classified: Secondary | ICD-10-CM | POA: Diagnosis not present

## 2016-06-30 DIAGNOSIS — R262 Difficulty in walking, not elsewhere classified: Secondary | ICD-10-CM | POA: Diagnosis not present

## 2016-07-06 DIAGNOSIS — M25462 Effusion, left knee: Secondary | ICD-10-CM | POA: Diagnosis not present

## 2016-07-07 DIAGNOSIS — M25562 Pain in left knee: Secondary | ICD-10-CM | POA: Diagnosis not present

## 2016-07-07 DIAGNOSIS — R262 Difficulty in walking, not elsewhere classified: Secondary | ICD-10-CM | POA: Diagnosis not present

## 2016-07-07 DIAGNOSIS — M25662 Stiffness of left knee, not elsewhere classified: Secondary | ICD-10-CM | POA: Diagnosis not present

## 2016-07-07 DIAGNOSIS — M25462 Effusion, left knee: Secondary | ICD-10-CM | POA: Diagnosis not present

## 2016-07-21 DIAGNOSIS — M25462 Effusion, left knee: Secondary | ICD-10-CM | POA: Diagnosis not present

## 2016-07-21 DIAGNOSIS — M25562 Pain in left knee: Secondary | ICD-10-CM | POA: Diagnosis not present

## 2016-07-21 DIAGNOSIS — M25662 Stiffness of left knee, not elsewhere classified: Secondary | ICD-10-CM | POA: Diagnosis not present

## 2016-07-21 DIAGNOSIS — R262 Difficulty in walking, not elsewhere classified: Secondary | ICD-10-CM | POA: Diagnosis not present

## 2016-07-22 DIAGNOSIS — N182 Chronic kidney disease, stage 2 (mild): Secondary | ICD-10-CM | POA: Diagnosis not present

## 2016-07-22 DIAGNOSIS — R112 Nausea with vomiting, unspecified: Secondary | ICD-10-CM | POA: Diagnosis not present

## 2016-07-22 DIAGNOSIS — E1122 Type 2 diabetes mellitus with diabetic chronic kidney disease: Secondary | ICD-10-CM | POA: Diagnosis not present

## 2016-07-22 DIAGNOSIS — J309 Allergic rhinitis, unspecified: Secondary | ICD-10-CM | POA: Diagnosis not present

## 2016-07-28 DIAGNOSIS — N182 Chronic kidney disease, stage 2 (mild): Secondary | ICD-10-CM | POA: Diagnosis not present

## 2016-07-28 DIAGNOSIS — M25462 Effusion, left knee: Secondary | ICD-10-CM | POA: Diagnosis not present

## 2016-07-28 DIAGNOSIS — M25562 Pain in left knee: Secondary | ICD-10-CM | POA: Diagnosis not present

## 2016-07-28 DIAGNOSIS — N63 Unspecified lump in breast: Secondary | ICD-10-CM | POA: Diagnosis not present

## 2016-07-28 DIAGNOSIS — M25662 Stiffness of left knee, not elsewhere classified: Secondary | ICD-10-CM | POA: Diagnosis not present

## 2016-07-28 DIAGNOSIS — R262 Difficulty in walking, not elsewhere classified: Secondary | ICD-10-CM | POA: Diagnosis not present

## 2016-07-28 DIAGNOSIS — Z23 Encounter for immunization: Secondary | ICD-10-CM | POA: Diagnosis not present

## 2016-07-28 DIAGNOSIS — E1122 Type 2 diabetes mellitus with diabetic chronic kidney disease: Secondary | ICD-10-CM | POA: Diagnosis not present

## 2016-07-29 ENCOUNTER — Other Ambulatory Visit: Payer: Self-pay

## 2016-07-29 NOTE — Patient Outreach (Addendum)
[place] [facility]) Care Management  07/29/2016  AQSA VIK 11-Mar-1961 [id number]   Telephone Screen  Referral Date: 07/26/16 Referral Source: MD office(Dr. Baird Cancer) Referral Reason: " DM, eval and treat, med management"    Outreach attempt # 1 to patient. No answer at present. Patient reached and screening completed.  Social: Patient resides in her home. She reports that she is independent with ADLs/IADLs. She denies any issues with transportation and drives herself to medical appt. No recent falls. DME in the home include cbg meter and BP monitor.  Conditions: Patient has PMH of DM,HTN,HLD, cardiomyopathy, CKD stage 2 and CHF. Patient admits that she is not monitoring her blood sugars or BP as she should. She reports that normally blood sugars run in the 120s-150s. Last A1C was 6.1(July 2017). Patient self reported that she needs help with diet education and adhering to diabetic and low sodium diet. Patient has BP machine in the home but states that she does not routinely check as it normally is normal and WNL. Patient reports that she is monitoring her weight daily. Denies any current issues with fluid retention.   Medications: Patient states she is taking greater than 15 meds. [place] referral already pending. Patient reports she has concerns regarding her acid reflux med that she was recently started on. She think that it is the culprit of her recent n/v episodes. She also attributes some diarrhea which has resolved to her taking Vitamin supplements.   Appointments: Patient saw PCP on 07/22/16. She sess cardiologist-Dr. Terrence Dupont q3 months.   Consent: Patient gave verbal consent for [facility] services.    Plan: RN CM will notify [facility] administrative assistant of case status. RN CM will make [facility] health coach referral for further disease management and education. RN CM provided patient with [facility] contact info for future reference.    Enzo Montgomery, RN,BSN,CCM [place] Management Telephonic Care Management Coordinator Direct Phone: [phone] Toll Free: [phone] Fax: [phone]

## 2016-08-03 ENCOUNTER — Other Ambulatory Visit: Payer: Self-pay | Admitting: Nurse Practitioner

## 2016-08-03 DIAGNOSIS — N6321 Unspecified lump in the left breast, upper outer quadrant: Secondary | ICD-10-CM

## 2016-08-03 DIAGNOSIS — N6323 Unspecified lump in the left breast, lower outer quadrant: Secondary | ICD-10-CM

## 2016-08-04 ENCOUNTER — Other Ambulatory Visit (HOSPITAL_COMMUNITY): Payer: Self-pay | Admitting: Internal Medicine

## 2016-08-04 DIAGNOSIS — R112 Nausea with vomiting, unspecified: Secondary | ICD-10-CM

## 2016-08-05 ENCOUNTER — Other Ambulatory Visit: Payer: Self-pay

## 2016-08-05 NOTE — Patient Outreach (Signed)
[place] [facility]) Care Management  08/05/2016  TIKEYA VAUX 14-Nov-1960 [id number]   Telephone call to patient for initial assessment.  Patient reports she cannot talk right now and asked for a call next week.  Plan: RN Health Coach will attempt patient again within 10 business days.   Jone Baseman, RN, MSN [place] [phone]

## 2016-08-09 ENCOUNTER — Ambulatory Visit
Admission: RE | Admit: 2016-08-09 | Discharge: 2016-08-09 | Disposition: A | Discharge status: Home / Self Care | Payer: Medicare Other | Source: Ambulatory Visit | Attending: Nurse Practitioner | Admitting: Nurse Practitioner

## 2016-08-09 ENCOUNTER — Other Ambulatory Visit: Payer: Self-pay

## 2016-08-09 DIAGNOSIS — N6323 Unspecified lump in the left breast, lower outer quadrant: Secondary | ICD-10-CM

## 2016-08-09 DIAGNOSIS — E118 Type 2 diabetes mellitus with unspecified complications: Secondary | ICD-10-CM

## 2016-08-09 DIAGNOSIS — N6321 Unspecified lump in the left breast, upper outer quadrant: Secondary | ICD-10-CM

## 2016-08-09 DIAGNOSIS — E119 Type 2 diabetes mellitus without complications: Secondary | ICD-10-CM | POA: Insufficient documentation

## 2016-08-09 DIAGNOSIS — N6489 Other specified disorders of breast: Secondary | ICD-10-CM | POA: Diagnosis not present

## 2016-08-09 DIAGNOSIS — R928 Other abnormal and inconclusive findings on diagnostic imaging of breast: Secondary | ICD-10-CM | POA: Diagnosis not present

## 2016-08-09 NOTE — Patient Outreach (Signed)
[name]) Care Management  [place]  08/09/2016   [name] 1961-07-06 [id number]  Subjective: Telephone call to patient for initial assessment.  Patient reports she is doing good.  Patient reports being busy most of the time.  Patient reports her last A1c was 6.1. Discussed with patient goal of keeping A1c less than 6.5.  She verbalized understanding.  Patient is not checking her sugars on a regular basis. Discussed with patient the importance of checking her sugars and set goal of checking of at least weekly.  She verbalized understanding.  Also discussed portion control as a means to control sugars as patient admits to following her diabetic diet half of the time.  She verbalized understanding.    Objective:   Encounter Medications:  Outpatient Encounter Prescriptions as of 08/09/2016  Medication Sig Note  . aspirin 81 MG EC tablet Take 81 mg by mouth daily. Swallow whole.   Marland Kitchen atorvastatin (LIPITOR) 40 MG tablet  05/11/2016: Received from: External Pharmacy  . baclofen (LIORESAL) 10 MG tablet  05/11/2016: Received from: External Pharmacy  . carvedilol (COREG) 25 MG tablet Take 25 mg by mouth 2 (two) times daily with a meal.    . folic acid (FOLVITE) 1 MG tablet Take 1 mg by mouth daily. 08/28/2012: --  . furosemide (LASIX) 40 MG tablet Take 1 tablet (40 mg total) by mouth daily.   Marland Kitchen gabapentin (NEURONTIN) 600 MG tablet Take 600 mg by mouth at bedtime. 08/09/2016: Taking 1 tablet in morning and evening, 2 tablets at night  . lisinopril (PRINIVIL,ZESTRIL) 20 MG tablet Take 20 mg by mouth daily. 09/22/2015: Received from: External Pharmacy Received Sig:   . Magnesium Oxide (MAG-200 PO) Take 200 mg by mouth at bedtime.   . metFORMIN (GLUCOPHAGE-XR) 500 MG 24 hr tablet Take 500 mg by mouth 2 (two) times daily. 09/22/2015: Received from: External Pharmacy Received Sig:   . omeprazole (PRILOSEC) 40 MG capsule Take 40 mg by mouth daily.   Marland Kitchen oxyCODONE-acetaminophen  (PERCOCET/ROXICET) 5-325 MG tablet  08/09/2016: 1 tablet as needed for pain.  Marland Kitchen pyridOXINE (VITAMIN B-6) 100 MG tablet Take 100 mg by mouth daily.   Marland Kitchen spironolactone (ALDACTONE) 25 MG tablet Take 25 mg by mouth 2 (two) times daily.   . meloxicam (MOBIC) 15 MG tablet Take 15 mg by mouth daily as needed. Reported on 03/15/2016 09/22/2015: Received from: External Pharmacy Received Sig:   . methocarbamol (ROBAXIN) 500 MG tablet Take 500 mg by mouth 2 (two) times daily as needed for muscle spasms.   . Potassium 99 MG TABS Take 99 mg by mouth daily. 10/01/2015: Completed has stopped    No facility-administered encounter medications on file as of 08/09/2016.     Functional Status:  In your present state of health, do you have any difficulty performing the following activities: 08/09/2016  Hearing? N  Vision? N  Difficulty concentrating or making decisions? N  Walking or climbing stairs? N  Dressing or bathing? N  Doing errands, shopping? N  Preparing Food and eating ? N  Using the Toilet? N  In the past six months, have you accidently leaked urine? N  Do you have problems with loss of bowel control? N  Managing your Medications? N  Managing your Finances? N  Housekeeping or managing your Housekeeping? N  Some recent data might be hidden    Fall/Depression Screening: PHQ 2/9 Scores 08/09/2016 07/29/2016  PHQ - 2 Score 0 0    Assessment: Patient will  benefit from health coach outreach for education and support for diabetes management.   Plan:  [facility] CM Care Plan Problem One   Flowsheet Row Most Recent Value  Care Plan Problem One  [place] Knowledge Deficit  Role Documenting the Problem One  [place] for Problem One  Active  THN Long Term Goal (31-90 days)  Patient will keep A1c less than 6.5 within 90 days.  THN Long Term Goal Start Date  08/09/16  Interventions for Problem One [place] coach discussed with patient A1c and goal of keeping less than 6.5.  THN  CM Short Term Goal #1 (0-30 days)  Patient will check blood sugar at least weekly within 30 days.  THN CM Short Term Goal #1 Start Date  08/09/16  Interventions for Short Term Goal #1  RN Health Coach discussed with patient the importance of checking blood sugar regularly.    THN CM Short Term Goal #2 (0-30 days)  Patient will report limiting food portions in order to control sugars within 30 days.  THN CM Short Term Goal #2 Start Date  08/09/16  Interventions for Short Term Goal #2  [place] discussed with patient the importance of limiting food portions in order to control sugars.       RN Health Coach will provide ongoing education for patient on diabetes through phone calls and sending printed information to patient for further discussion.  RN Health Coach will send welcome packet with consent to patient as well as printed information on diabetes. RN Health Coach will send initial barriers letter, assessment, and care plan to primary care physician.  RN Health Coach will contact patient in the month of October and patient agrees to next contact.  Jone Baseman, RN, MSN [place] [phone]

## 2016-08-11 DIAGNOSIS — R262 Difficulty in walking, not elsewhere classified: Secondary | ICD-10-CM | POA: Diagnosis not present

## 2016-08-11 DIAGNOSIS — M25462 Effusion, left knee: Secondary | ICD-10-CM | POA: Diagnosis not present

## 2016-08-11 DIAGNOSIS — M25562 Pain in left knee: Secondary | ICD-10-CM | POA: Diagnosis not present

## 2016-08-11 DIAGNOSIS — M25662 Stiffness of left knee, not elsewhere classified: Secondary | ICD-10-CM | POA: Diagnosis not present

## 2016-08-17 DIAGNOSIS — M25562 Pain in left knee: Secondary | ICD-10-CM | POA: Diagnosis not present

## 2016-08-27 ENCOUNTER — Ambulatory Visit (HOSPITAL_COMMUNITY)
Admission: RE | Admit: 2016-08-27 | Discharge: 2016-08-27 | Disposition: A | Discharge status: Home / Self Care | Payer: Medicare Other | Source: Ambulatory Visit | Attending: Internal Medicine | Admitting: Internal Medicine

## 2016-08-27 DIAGNOSIS — R112 Nausea with vomiting, unspecified: Secondary | ICD-10-CM | POA: Diagnosis not present

## 2016-08-27 MED ORDER — TECHNETIUM TC 99M SULFUR COLLOID
2.0000 | Freq: Once | INTRAVENOUS | Status: AC | PRN
  Administered 2016-08-27: 2 via INTRAVENOUS

## 2016-09-07 ENCOUNTER — Other Ambulatory Visit: Payer: Self-pay

## 2016-09-07 NOTE — Patient Outreach (Signed)
[place] [facility]) Care Management  [place]  09/07/2016   CARLYN DESHLER 1961-08-05 [id number]  Subjective: Telephone call to patient for monthly call. Patient reports having some problems with nausea and vomiting. She reports seeing her primary and she is waiting for a referral. Discussed with patient importance of maintaining blood sugars and eating things she can keep down to keep her sugar up.  EMMI information on Diabetes- when you are sick reviewed.  Will send copy. She verbalized understanding.Patient reports she has really not checked her sugars.  Reviewed with patient importance of checking her sugars. Reviewed with patient also continued diet control to control sugars.  She verbalized understanding.   Objective:   Encounter Medications:  Outpatient Encounter Prescriptions as of 09/07/2016  Medication Sig Note  . aspirin 81 MG EC tablet Take 81 mg by mouth daily. Swallow whole.   Marland Kitchen atorvastatin (LIPITOR) 40 MG tablet  05/11/2016: Received from: External Pharmacy  . baclofen (LIORESAL) 10 MG tablet  05/11/2016: Received from: External Pharmacy  . carvedilol (COREG) 25 MG tablet Take 25 mg by mouth 2 (two) times daily with a meal.    . folic acid (FOLVITE) 1 MG tablet Take 1 mg by mouth daily. 08/28/2012: --  . furosemide (LASIX) 40 MG tablet Take 1 tablet (40 mg total) by mouth daily.   Marland Kitchen gabapentin (NEURONTIN) 600 MG tablet Take 600 mg by mouth at bedtime. 08/09/2016: Taking 1 tablet in morning and evening, 2 tablets at night  . lisinopril (PRINIVIL,ZESTRIL) 20 MG tablet Take 20 mg by mouth daily. 09/22/2015: Received from: External Pharmacy Received Sig:   . Magnesium Oxide (MAG-200 PO) Take 200 mg by mouth at bedtime.   . meloxicam (MOBIC) 15 MG tablet Take 15 mg by mouth daily as needed. Reported on 03/15/2016 09/22/2015: Received from: External Pharmacy Received Sig:   . metFORMIN (GLUCOPHAGE-XR) 500 MG 24 hr tablet Take 500 mg by mouth 2 (two) times daily.  09/22/2015: Received from: External Pharmacy Received Sig:   . methocarbamol (ROBAXIN) 500 MG tablet Take 500 mg by mouth 2 (two) times daily as needed for muscle spasms.   Marland Kitchen omeprazole (PRILOSEC) 40 MG capsule Take 40 mg by mouth daily.   Marland Kitchen oxyCODONE-acetaminophen (PERCOCET/ROXICET) 5-325 MG tablet  08/09/2016: 1 tablet as needed for pain.  Marland Kitchen Potassium 99 MG TABS Take 99 mg by mouth daily. 10/01/2015: Completed has stopped   . pyridOXINE (VITAMIN B-6) 100 MG tablet Take 100 mg by mouth daily.   Marland Kitchen spironolactone (ALDACTONE) 25 MG tablet Take 25 mg by mouth 2 (two) times daily.    No facility-administered encounter medications on file as of 09/07/2016.     Functional Status:  In your present state of health, do you have any difficulty performing the following activities: 08/09/2016  Hearing? N  Vision? N  Difficulty concentrating or making decisions? N  Walking or climbing stairs? N  Dressing or bathing? N  Doing errands, shopping? N  Preparing Food and eating ? N  Using the Toilet? N  In the past six months, have you accidently leaked urine? N  Do you have problems with loss of bowel control? N  Managing your Medications? N  Managing your Finances? N  Housekeeping or managing your Housekeeping? N  Some recent data might be hidden    Fall/Depression Screening: PHQ 2/9 Scores 09/07/2016 08/09/2016 07/29/2016  PHQ - 2 Score 0 0 0    Assessment: Patient continues to benefit from health coach outreach for disease  management and support.    Plan:  [facility] CM Care Plan Problem One   Flowsheet Row Most Recent Value  Care Plan Problem One  [place] Knowledge Deficit  Role Documenting the Problem One  [place] for Problem One  Active  THN Long Term Goal (31-90 days)  Patient will keep A1c less than 6.5 within 90 days.  THN Long Term Goal Start Date  09/07/16  Interventions for Problem One [place] reviewed with patient A1c and goal of keeping less than  6.5.  THN CM Short Term Goal #1 (0-30 days)  Patient will check blood sugar at least weekly within 30 days.  THN CM Short Term Goal #1 Start Date  09/07/16  Interventions for Short Term Goal #1  RN Health Coach reviewed with patient the importance of checking blood sugar regularly.    THN CM Short Term Goal #2 (0-30 days)  Patient will report limiting food portions in order to control sugars within 30 days.  THN CM Short Term Goal #2 Start Date  09/07/16  Interventions for Short Term Goal #2  [place] reviewed with patient the importance of limiting food portions in order to control sugars.        RN Health Coach will contact patient in the month of December and patient agrees to next outreach.  Jone Baseman, RN, MSN [place] [phone]

## 2016-09-09 DIAGNOSIS — E1122 Type 2 diabetes mellitus with diabetic chronic kidney disease: Secondary | ICD-10-CM | POA: Diagnosis not present

## 2016-09-09 DIAGNOSIS — N182 Chronic kidney disease, stage 2 (mild): Secondary | ICD-10-CM | POA: Diagnosis not present

## 2016-09-09 DIAGNOSIS — Z9181 History of falling: Secondary | ICD-10-CM | POA: Diagnosis not present

## 2016-09-09 DIAGNOSIS — N08 Glomerular disorders in diseases classified elsewhere: Secondary | ICD-10-CM | POA: Diagnosis not present

## 2016-09-09 DIAGNOSIS — Z0000 Encounter for general adult medical examination without abnormal findings: Secondary | ICD-10-CM | POA: Diagnosis not present

## 2016-09-09 DIAGNOSIS — Z87891 Personal history of nicotine dependence: Secondary | ICD-10-CM | POA: Diagnosis not present

## 2016-09-09 DIAGNOSIS — I129 Hypertensive chronic kidney disease with stage 1 through stage 4 chronic kidney disease, or unspecified chronic kidney disease: Secondary | ICD-10-CM | POA: Diagnosis not present

## 2016-09-10 DIAGNOSIS — Z0000 Encounter for general adult medical examination without abnormal findings: Secondary | ICD-10-CM | POA: Diagnosis not present

## 2016-09-10 DIAGNOSIS — I129 Hypertensive chronic kidney disease with stage 1 through stage 4 chronic kidney disease, or unspecified chronic kidney disease: Secondary | ICD-10-CM | POA: Diagnosis not present

## 2016-09-10 DIAGNOSIS — N182 Chronic kidney disease, stage 2 (mild): Secondary | ICD-10-CM | POA: Diagnosis not present

## 2016-09-14 DIAGNOSIS — K219 Gastro-esophageal reflux disease without esophagitis: Secondary | ICD-10-CM | POA: Diagnosis not present

## 2016-09-14 DIAGNOSIS — K625 Hemorrhage of anus and rectum: Secondary | ICD-10-CM | POA: Diagnosis not present

## 2016-09-14 DIAGNOSIS — R1114 Bilious vomiting: Secondary | ICD-10-CM | POA: Diagnosis not present

## 2016-09-21 ENCOUNTER — Other Ambulatory Visit: Payer: Self-pay | Admitting: Gastroenterology

## 2016-09-21 DIAGNOSIS — R112 Nausea with vomiting, unspecified: Secondary | ICD-10-CM

## 2016-09-22 DIAGNOSIS — I119 Hypertensive heart disease without heart failure: Secondary | ICD-10-CM | POA: Diagnosis not present

## 2016-09-22 DIAGNOSIS — E785 Hyperlipidemia, unspecified: Secondary | ICD-10-CM | POA: Diagnosis not present

## 2016-09-22 DIAGNOSIS — I420 Dilated cardiomyopathy: Secondary | ICD-10-CM | POA: Diagnosis not present

## 2016-09-22 DIAGNOSIS — E119 Type 2 diabetes mellitus without complications: Secondary | ICD-10-CM | POA: Diagnosis not present

## 2016-10-04 ENCOUNTER — Other Ambulatory Visit: Payer: Self-pay | Admitting: Pharmacist

## 2016-10-04 NOTE — Patient Outreach (Signed)
[facility]) Care Management  [place]   10/04/2016  [name] July 19, 1961 [id number]  Subjective:  Patient was referred to [facility] for medication management, per last note scanned into chart from PCP in 07/2016, patient was experiencing nausea and thought it could be medication related.   Patient is receiving services from [facility], [place].    Patient has a PMH significant for type 2 diabetes mellitus, hypertension, hyperlipidemia, CHF and CKD.    Phone call to patient on 10/04/16, HIPAA verified, explained purpose of call and patient was willing to review her medications over the phone.   Patient reports she obtains most of her medications from [facility] and she denies financial concerns with her medications.  She denies adherence concerns or missing doses.    Objective:   Current Medications: Current Outpatient Prescriptions  Medication Sig Dispense Refill  . aspirin 81 MG EC tablet Take 81 mg by mouth daily. Swallow whole.    Marland Kitchen atorvastatin (LIPITOR) 40 MG tablet     . baclofen (LIORESAL) 10 MG tablet     . carvedilol (COREG) 25 MG tablet Take 25 mg by mouth 2 (two) times daily with a meal.     . folic acid (FOLVITE) 1 MG tablet Take 1 mg by mouth daily.    . furosemide (LASIX) 40 MG tablet Take 1 tablet (40 mg total) by mouth daily. 30 tablet 0  . gabapentin (NEURONTIN) 600 MG tablet Take 600 mg by mouth at bedtime.    Marland Kitchen lisinopril (PRINIVIL,ZESTRIL) 20 MG tablet Take 20 mg by mouth daily.    . Magnesium Oxide (MAG-200 PO) Take 200 mg by mouth at bedtime.    . meloxicam (MOBIC) 15 MG tablet Take 15 mg by mouth daily as needed. Reported on 03/15/2016    . metFORMIN (GLUCOPHAGE-XR) 500 MG 24 hr tablet Take 500 mg by mouth 2 (two) times daily.    . methocarbamol (ROBAXIN) 500 MG tablet Take 500 mg by mouth 2 (two) times daily as needed for muscle spasms.    Marland Kitchen omeprazole (PRILOSEC) 40 MG capsule Take 40 mg by mouth  daily.    Marland Kitchen oxyCODONE-acetaminophen (PERCOCET/ROXICET) 5-325 MG tablet     . Potassium 99 MG TABS Take 99 mg by mouth daily.    Marland Kitchen pyridOXINE (VITAMIN B-6) 100 MG tablet Take 100 mg by mouth daily.    Marland Kitchen spironolactone (ALDACTONE) 25 MG tablet Take 25 mg by mouth 2 (two) times daily.     No current facility-administered medications for this visit.     Functional Status: In your present state of health, do you have any difficulty performing the following activities: 08/09/2016  Hearing? N  Vision? N  Difficulty concentrating or making decisions? N  Walking or climbing stairs? N  Dressing or bathing? N  Doing errands, shopping? N  Preparing Food and eating ? N  Using the Toilet? N  In the past six months, have you accidently leaked urine? N  Do you have problems with loss of bowel control? N  Managing your Medications? N  Managing your Finances? N  Housekeeping or managing your Housekeeping? N  Some recent data might be hidden    Fall/Depression Screening: PHQ 2/9 Scores 09/07/2016 08/09/2016 07/29/2016  PHQ - 2 Score 0 0 0    Assessment:   Drugs sorted by system: (per patient report)   Neurologic/Psychologic: -gabapentin---patient reports she takes 600 mg about every other day   Cardiovascular: -  aspirin 81 mg  -atorvastatin  -carvedilol  -furosemide  -lisinopril  -spironolactone   Endocrine: -metformin XR   Vitamins/Minerals: -folic acid  -magnesium oxide  -pyridoxine   Miscellaneous: -baclofen    Other issues noted:  -Carvedilol dose discrepancy:  -patient reports taking 25 mg twice daily, medication list from 07/2016 PCP office note lists dose at 12.5 mg twice daily.  Suggest clarifying dose.    -Patient reported previously using OTC potassium  -Recommend monitoring potassium level, and if potassium supplementation is therapeutically necessary, suggest use of a prescription potassium product versus OTC product.    The following medications are listed on  medication list in this chart that patient reports not taking: -meloxicam -methocarbamol -omeprazole -oxycodone/acetaminophen -potassium OTC  Patient education:  -Counseled patient on her medications, including but not limited to, lisinopril being for CHF and kidney protection, carvedilol, spironolactone and furosemide being for CHF. -Counseled patient to be sure to take metformin with food to reduce incidence of GI side effects such as diarrhea.    Plan:  Will route this note to patient's PCP.   Will not open pharmacy case as patient denies other pharmacy related needs or questions at this time.    Patient confirmed she has [facility] phone number and is aware she can contact [place] or [place] with questions.   Karrie Meres, PharmD, [place] [phone]

## 2016-10-11 ENCOUNTER — Other Ambulatory Visit: Payer: Self-pay

## 2016-10-11 NOTE — Patient Outreach (Signed)
[place] [facility]) Care Management  [place]  10/11/2016   [name] 24-Jul-1961 [id number]  Subjective: Telephone call to patient for monthly call.  Patient she is doing ok but is having a headache today.  Patient reports she has a lot going on.  Patient is checking her sugar.  Patient reports blood sugars of 143, 158, and 132.  Congratulated patient on checking her sugars.  She states they all have been less than 160.  Discussed with patient continuing diet and taking medications as prescribed.  She verbalized understanding.    Objective:   Encounter Medications:  Outpatient Encounter Prescriptions as of 10/11/2016  Medication Sig Note  . aspirin 81 MG EC tablet Take 81 mg by mouth daily. Swallow whole.   Marland Kitchen atorvastatin (LIPITOR) 40 MG tablet  05/11/2016: Received from: External Pharmacy  . baclofen (LIORESAL) 10 MG tablet Take 10 mg by mouth 2 (two) times daily.  05/11/2016: Received from: External Pharmacy  . carvedilol (COREG) 25 MG tablet Take 25 mg by mouth 2 (two) times daily with a meal.    . folic acid (FOLVITE) 1 MG tablet Take 1 mg by mouth daily. 08/28/2012: --  . furosemide (LASIX) 40 MG tablet Take 1 tablet (40 mg total) by mouth daily.   Marland Kitchen gabapentin (NEURONTIN) 600 MG tablet Take 600 mg by mouth at bedtime. 10/05/2016: Patient reports she takes 600 mg at bedtime about every other day.    . lisinopril (PRINIVIL,ZESTRIL) 20 MG tablet Take 20 mg by mouth daily. 09/22/2015: Received from: External Pharmacy Received Sig:   . Magnesium Oxide (MAG-200 PO) Take 400 mg by mouth at bedtime.    . metFORMIN (GLUCOPHAGE-XR) 500 MG 24 hr tablet Take 500 mg by mouth daily with breakfast.  10/05/2016: Patient reports PCP decreased from twice daily to once daily.    Marland Kitchen omeprazole (PRILOSEC) 40 MG capsule Take 40 mg by mouth daily.   Marland Kitchen pyridOXINE (VITAMIN B-6) 100 MG tablet Take 100 mg by mouth daily.   Marland Kitchen spironolactone (ALDACTONE) 25 MG tablet Take 25 mg by mouth 2  (two) times daily. 10/05/2016: Patient reports taking once daily.    . meloxicam (MOBIC) 15 MG tablet Take 15 mg by mouth daily as needed. Reported on 03/15/2016 09/22/2015: Received from: External Pharmacy Received Sig:   . methocarbamol (ROBAXIN) 500 MG tablet Take 500 mg by mouth 2 (two) times daily as needed for muscle spasms.   Marland Kitchen oxyCODONE-acetaminophen (PERCOCET/ROXICET) 5-325 MG tablet  08/09/2016: 1 tablet as needed for pain.  Marland Kitchen Potassium 99 MG TABS Take 99 mg by mouth daily. 10/01/2015: Completed has stopped    No facility-administered encounter medications on file as of 10/11/2016.     Functional Status:  In your present state of health, do you have any difficulty performing the following activities: 08/09/2016  Hearing? N  Vision? N  Difficulty concentrating or making decisions? N  Walking or climbing stairs? N  Dressing or bathing? N  Doing errands, shopping? N  Preparing Food and eating ? N  Using the Toilet? N  In the past six months, have you accidently leaked urine? N  Do you have problems with loss of bowel control? N  Managing your Medications? N  Managing your Finances? N  Housekeeping or managing your Housekeeping? N  Some recent data might be hidden    Fall/Depression Screening: PHQ 2/9 Scores 10/11/2016 09/07/2016 08/09/2016 07/29/2016  PHQ - 2 Score 0 0 0 0    Assessment: Patient continues  to benefit from health coach outreach for disease management and support.    Plan:  [facility] CM Care Plan Problem One   Flowsheet Row Most Recent Value  Care Plan Problem One  Diaetes Knowledge Deficit  Role Documenting the Problem One  [place] for Problem One  Active  THN Long Term Goal (31-90 days)  Patient will keep A1c less than 6.5 within 90 days.  THN Long Term Goal Start Date  09/07/16  Interventions for Problem One [place] reiterated with patient A1c and goal of keeping less than 6.5.  THN CM Short Term Goal #1 (0-30 days)   Patient will check blood sugar at least weekly within 30 days.  THN CM Short Term Goal #1 Start Date  09/07/16  THN CM Short Term Goal #1 Met Date  10/11/16  Interventions for Short Term Goal #1  patient checking blood sugar at least every other day.  THN CM Short Term Goal #2 (0-30 days)  Patient will report limiting food portions in order to control sugars within 30 days.  THN CM Short Term Goal #2 Start Date  10/11/16  Interventions for Short Term Goal #2  RN Health Coach reiterated with patient the importance of limiting food portions in order to control sugars.       RN Health Coach will contact patient in the month of January and patient agrees to next outreach.  Jone Baseman, RN, MSN [place] [phone]

## 2016-10-13 ENCOUNTER — Encounter (HOSPITAL_COMMUNITY)
Admission: RE | Admit: 2016-10-13 | Discharge: 2016-10-13 | Disposition: A | Discharge status: Home / Self Care | Payer: Medicare Other | Source: Ambulatory Visit | Attending: Gastroenterology | Admitting: Gastroenterology

## 2016-10-13 ENCOUNTER — Ambulatory Visit (HOSPITAL_COMMUNITY)
Admission: RE | Admit: 2016-10-13 | Discharge: 2016-10-13 | Disposition: A | Discharge status: Home / Self Care | Payer: Medicare Other | Source: Ambulatory Visit | Attending: Gastroenterology | Admitting: Gastroenterology

## 2016-10-13 DIAGNOSIS — R112 Nausea with vomiting, unspecified: Secondary | ICD-10-CM | POA: Insufficient documentation

## 2016-10-13 DIAGNOSIS — R1011 Right upper quadrant pain: Secondary | ICD-10-CM | POA: Diagnosis not present

## 2016-10-13 MED ORDER — TECHNETIUM TC 99M MEBROFENIN IV KIT
5.1000 | PACK | Freq: Once | INTRAVENOUS | Status: AC | PRN
  Administered 2016-10-13: 5.1 via INTRAVENOUS

## 2016-10-13 MED ORDER — FLUDEOXYGLUCOSE F - 18 (FDG) INJECTION: 5.1000 | Freq: Once | INTRAVENOUS | Status: DC | PRN

## 2016-10-21 DIAGNOSIS — R112 Nausea with vomiting, unspecified: Secondary | ICD-10-CM | POA: Diagnosis not present

## 2016-10-21 DIAGNOSIS — K219 Gastro-esophageal reflux disease without esophagitis: Secondary | ICD-10-CM | POA: Diagnosis not present

## 2016-11-04 DIAGNOSIS — M25562 Pain in left knee: Secondary | ICD-10-CM | POA: Diagnosis not present

## 2016-11-11 ENCOUNTER — Telehealth: Payer: Self-pay

## 2016-11-11 ENCOUNTER — Ambulatory Visit: Payer: Medicare Other | Admitting: Neurology

## 2016-11-11 ENCOUNTER — Other Ambulatory Visit: Payer: Self-pay

## 2016-11-11 NOTE — Patient Outreach (Signed)
[place] [facility]) Care Management  [place]  11/11/2016   CHESNIE CAPELL 1961-11-01 [id number]  Subjective: Telephone call to patient for monthly call. Patient reports she is doing good. Patient reports that her sugars are in the 140-150 range.  She continues to watch her diet.  Reiterated with patient the importance of diet and limiting carbohydrates in order to control her sugars.  She verbalized understanding.     Objective:   Encounter Medications:  Outpatient Encounter Prescriptions as of 11/11/2016  Medication Sig Note  . aspirin 81 MG EC tablet Take 81 mg by mouth daily. Swallow whole.   Marland Kitchen atorvastatin (LIPITOR) 40 MG tablet  05/11/2016: Received from: External Pharmacy  . baclofen (LIORESAL) 10 MG tablet Take 10 mg by mouth 2 (two) times daily.  05/11/2016: Received from: External Pharmacy  . carvedilol (COREG) 25 MG tablet Take 25 mg by mouth 2 (two) times daily with a meal.    . folic acid (FOLVITE) 1 MG tablet Take 1 mg by mouth daily. 08/28/2012: --  . furosemide (LASIX) 40 MG tablet Take 1 tablet (40 mg total) by mouth daily.   Marland Kitchen gabapentin (NEURONTIN) 600 MG tablet Take 600 mg by mouth at bedtime. 10/05/2016: Patient reports she takes 600 mg at bedtime about every other day.    . lisinopril (PRINIVIL,ZESTRIL) 20 MG tablet Take 20 mg by mouth daily. 09/22/2015: Received from: External Pharmacy Received Sig:   . Magnesium Oxide (MAG-200 PO) Take 400 mg by mouth at bedtime.    . meloxicam (MOBIC) 15 MG tablet Take 15 mg by mouth daily as needed. Reported on 03/15/2016 09/22/2015: Received from: External Pharmacy Received Sig:   . metFORMIN (GLUCOPHAGE-XR) 500 MG 24 hr tablet Take 500 mg by mouth daily with breakfast.  10/05/2016: Patient reports PCP decreased from twice daily to once daily.    . methocarbamol (ROBAXIN) 500 MG tablet Take 500 mg by mouth 2 (two) times daily as needed for muscle spasms.   Marland Kitchen omeprazole (PRILOSEC) 40 MG capsule Take 40 mg by mouth  daily.   Marland Kitchen oxyCODONE-acetaminophen (PERCOCET/ROXICET) 5-325 MG tablet  08/09/2016: 1 tablet as needed for pain.  Marland Kitchen pyridOXINE (VITAMIN B-6) 100 MG tablet Take 100 mg by mouth daily.   Marland Kitchen spironolactone (ALDACTONE) 25 MG tablet Take 25 mg by mouth 2 (two) times daily. 10/05/2016: Patient reports taking once daily.    . Potassium 99 MG TABS Take 99 mg by mouth daily. 10/01/2015: Completed has stopped    No facility-administered encounter medications on file as of 11/11/2016.     Functional Status:  In your present state of health, do you have any difficulty performing the following activities: 08/09/2016  Hearing? N  Vision? N  Difficulty concentrating or making decisions? N  Walking or climbing stairs? N  Dressing or bathing? N  Doing errands, shopping? N  Preparing Food and eating ? N  Using the Toilet? N  In the past six months, have you accidently leaked urine? N  Do you have problems with loss of bowel control? N  Managing your Medications? N  Managing your Finances? N  Housekeeping or managing your Housekeeping? N  Some recent data might be hidden    Fall/Depression Screening: PHQ 2/9 Scores 11/11/2016 10/11/2016 09/07/2016 08/09/2016 07/29/2016  PHQ - 2 Score 0 0 0 0 0    Assessment: Patient continues to benefit from health coach outreach for disease management and support.    Plan:  [facility] CM Care Plan Problem One  Flowsheet Row Most Recent Value  Care Plan Problem One  Diaetes Knowledge Deficit  Role Documenting the Problem One  [place] for Problem One  Active  THN Long Term Goal (31-90 days)  Patient will keep A1c less than 6.5 within 90 days.  THN Long Term Goal Start Date  11/11/16  Interventions for Problem One [place] discussed with patient A1c and goal of keeping less than 6.5.  THN CM Short Term Goal #2 (0-30 days)  Patient will report limiting food portions in order to control sugars within 30 days.  THN CM Short Term Goal #2  Start Date  10/11/16  [facility] CM Short Term Goal #2 Met Date  11/11/16  Interventions for Short Term Goal #2  goal met     RN Health Coach will contact patient in the month of February and patient agrees to next outreach.  Jone Baseman, RN, MSN [place] [phone]

## 2016-11-11 NOTE — Telephone Encounter (Signed)
I spoke to patient to r/s appt, provider out sick. Patient asks to cancel appt for now. States that she is not having any issues. She says that she will call us back if needed.

## 2016-11-19 IMAGING — CR DG CHEST 2V
2 series · 2 of 2 positions shown · non-contrast
Comparison: Chest radiograph performed 09/22/2012

CLINICAL DATA: Acute onset of cough, wheezing, congestion and mid
chest tightness. Initial encounter.

EXAM:
CHEST  2 VIEW

[w chest pa]
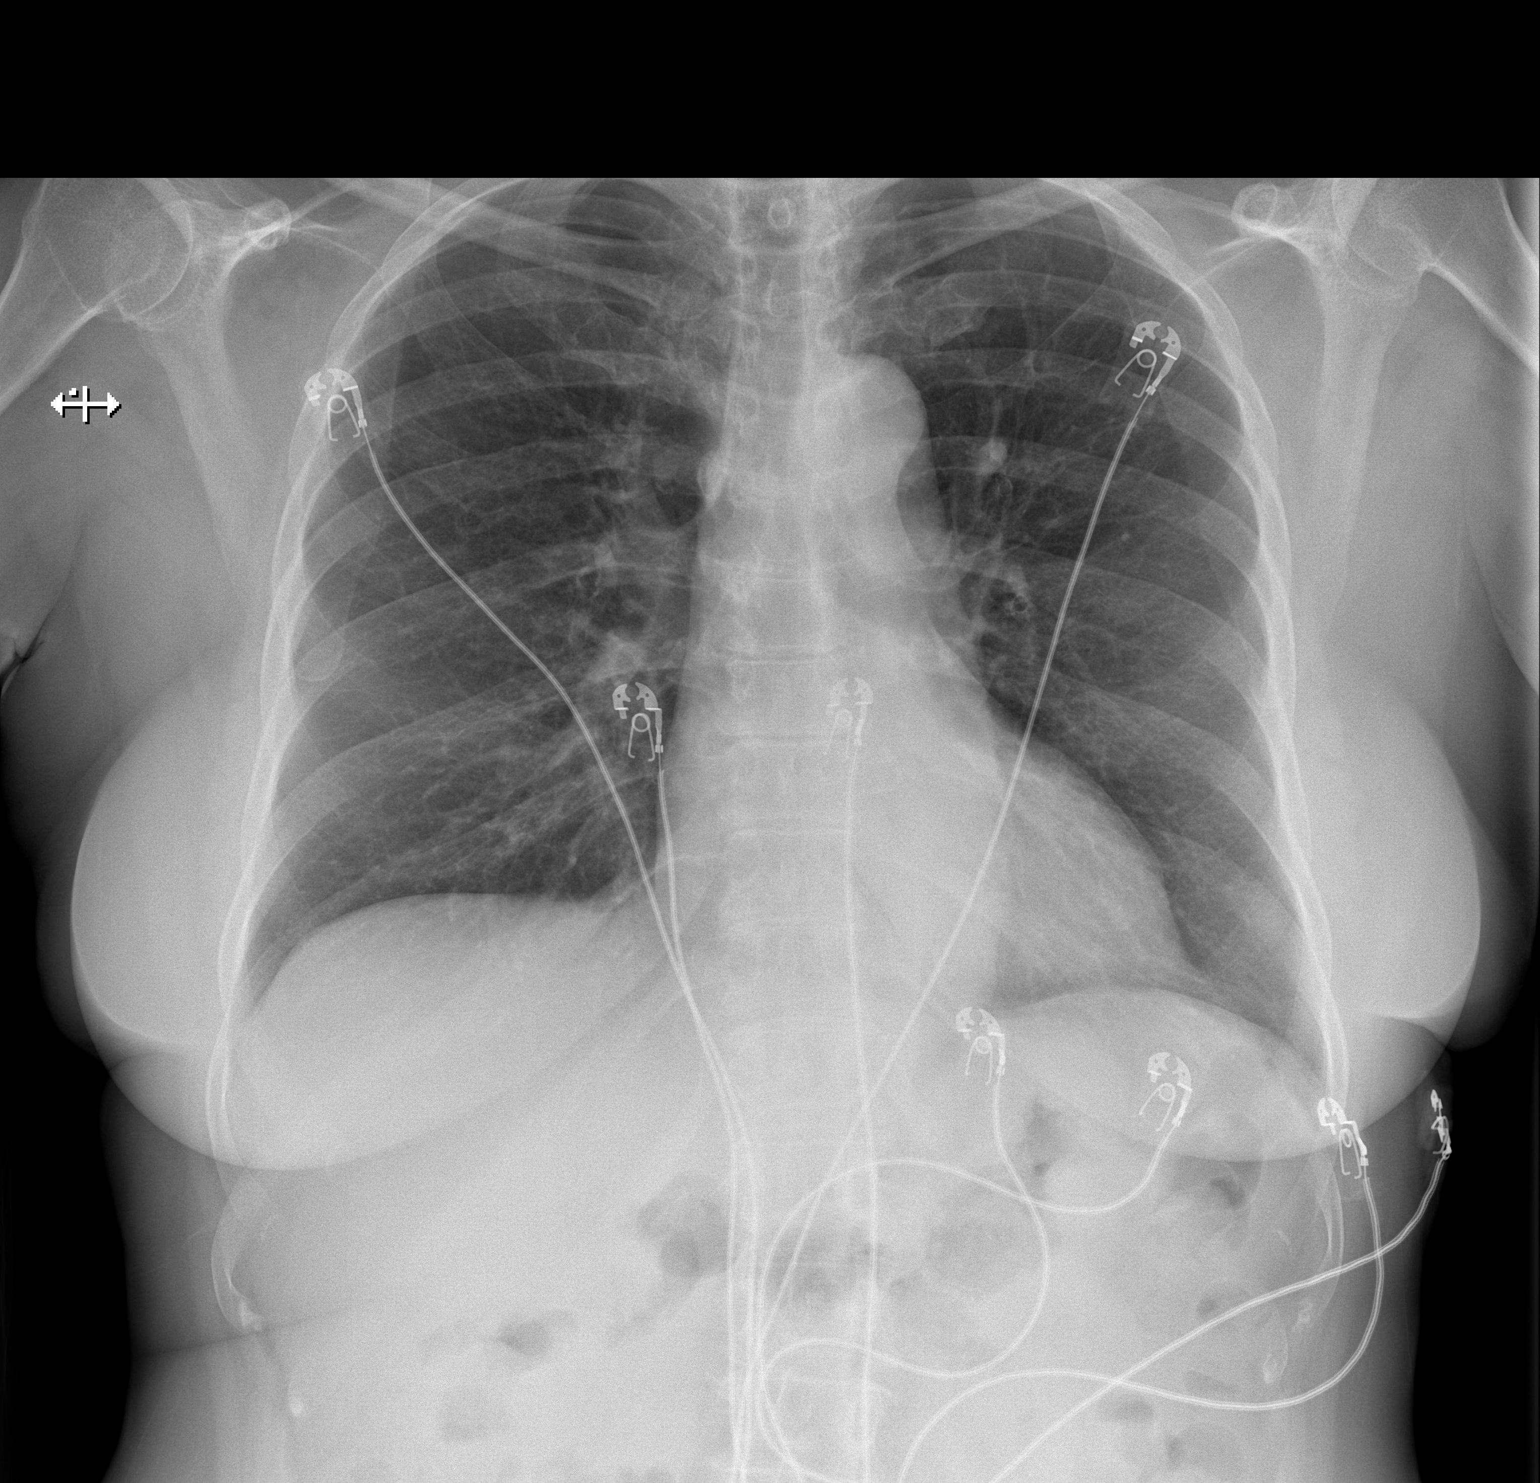

[w chest lat]
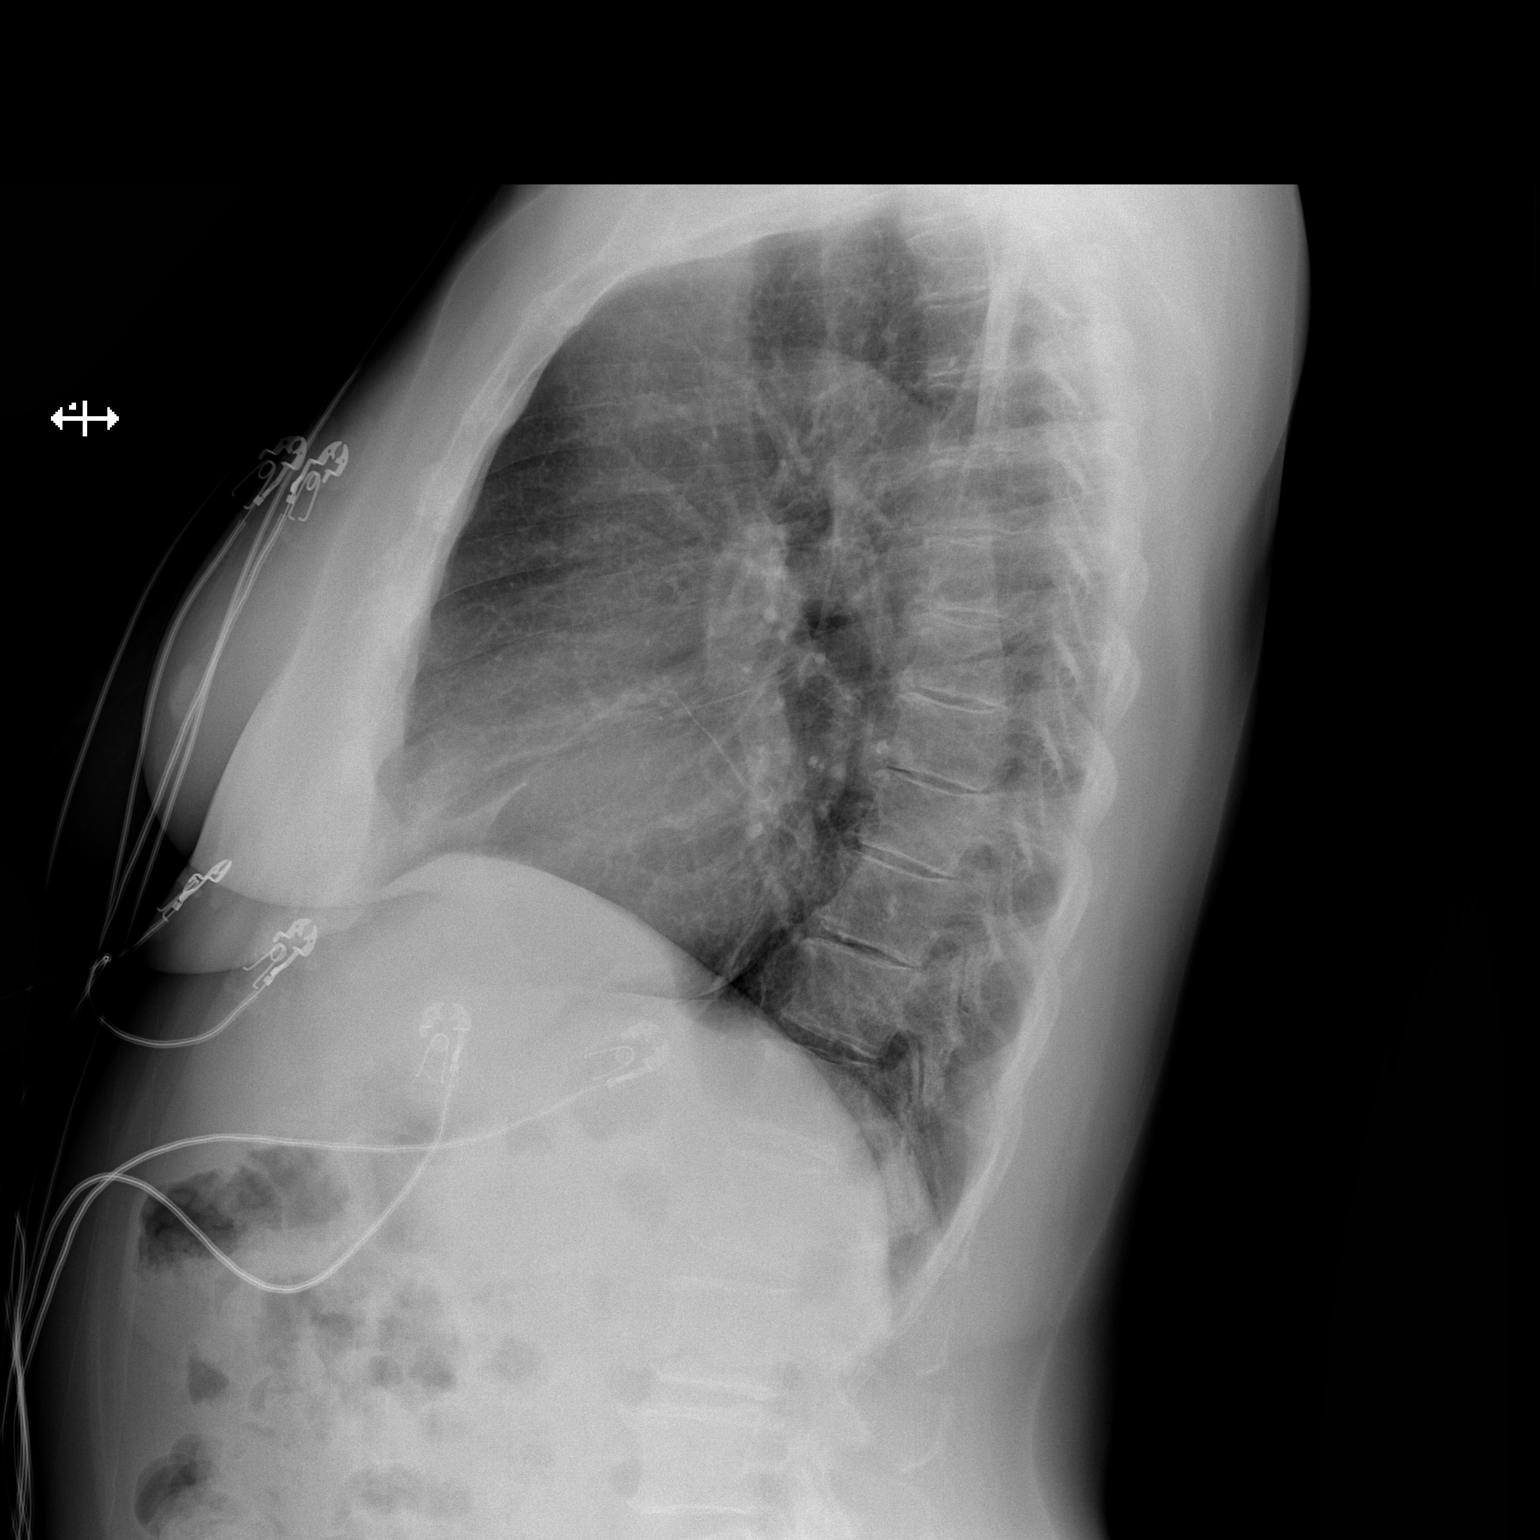

[2 of 2 positions shown; findings below may reference images not displayed]

FINDINGS: The lungs are well-aerated and clear. There is no evidence of focal
opacification, pleural effusion or pneumothorax.

The heart is normal in size; the mediastinal contour is within
normal limits. No acute osseous abnormalities are seen.
IMPRESSION: No acute cardiopulmonary process seen.

## 2016-12-01 DIAGNOSIS — M24872 Other specific joint derangements of left ankle, not elsewhere classified: Secondary | ICD-10-CM | POA: Diagnosis not present

## 2016-12-01 DIAGNOSIS — M25572 Pain in left ankle and joints of left foot: Secondary | ICD-10-CM | POA: Diagnosis not present

## 2016-12-01 DIAGNOSIS — M7752 Other enthesopathy of left foot: Secondary | ICD-10-CM | POA: Diagnosis not present

## 2016-12-01 DIAGNOSIS — M792 Neuralgia and neuritis, unspecified: Secondary | ICD-10-CM | POA: Diagnosis not present

## 2016-12-08 ENCOUNTER — Ambulatory Visit: Payer: Self-pay

## 2016-12-09 DIAGNOSIS — M25552 Pain in left hip: Secondary | ICD-10-CM | POA: Diagnosis not present

## 2016-12-09 DIAGNOSIS — N08 Glomerular disorders in diseases classified elsewhere: Secondary | ICD-10-CM | POA: Diagnosis not present

## 2016-12-09 DIAGNOSIS — I129 Hypertensive chronic kidney disease with stage 1 through stage 4 chronic kidney disease, or unspecified chronic kidney disease: Secondary | ICD-10-CM | POA: Diagnosis not present

## 2016-12-09 DIAGNOSIS — N182 Chronic kidney disease, stage 2 (mild): Secondary | ICD-10-CM | POA: Diagnosis not present

## 2016-12-09 DIAGNOSIS — E1122 Type 2 diabetes mellitus with diabetic chronic kidney disease: Secondary | ICD-10-CM | POA: Diagnosis not present

## 2016-12-16 DIAGNOSIS — M7062 Trochanteric bursitis, left hip: Secondary | ICD-10-CM | POA: Diagnosis not present

## 2016-12-16 DIAGNOSIS — M4316 Spondylolisthesis, lumbar region: Secondary | ICD-10-CM | POA: Diagnosis not present

## 2016-12-16 DIAGNOSIS — M545 Low back pain: Secondary | ICD-10-CM | POA: Diagnosis not present

## 2016-12-16 DIAGNOSIS — S336XXA Sprain of sacroiliac joint, initial encounter: Secondary | ICD-10-CM | POA: Diagnosis not present

## 2016-12-20 ENCOUNTER — Other Ambulatory Visit: Payer: Self-pay

## 2016-12-20 NOTE — Patient Outreach (Signed)
[facility]) Care Management  [place]  12/20/2016   LAIONNA BARRIONUEVO Jun 30, 1961 [id number]  Subjective: Telephone call to patient for monthly call.  Patient reports she is doing good.  She reports recent visit to primary doctor.  She states that her A1c was 6.1 and that her sugars are between 120-125. Discussed with patient continuing to maintain her sugars and notify health coach for any questions or concerns.  She verbalized understanding.  Objective:   Encounter Medications:  Outpatient Encounter Prescriptions as of 12/20/2016  Medication Sig Note  . aspirin 81 MG EC tablet Take 81 mg by mouth daily. Swallow whole.   Marland Kitchen atorvastatin (LIPITOR) 40 MG tablet  05/11/2016: Received from: External Pharmacy  . baclofen (LIORESAL) 10 MG tablet Take 10 mg by mouth 2 (two) times daily.  05/11/2016: Received from: External Pharmacy  . carvedilol (COREG) 25 MG tablet Take 25 mg by mouth 2 (two) times daily with a meal.    . folic acid (FOLVITE) 1 MG tablet Take 1 mg by mouth daily. 08/28/2012: --  . furosemide (LASIX) 40 MG tablet Take 1 tablet (40 mg total) by mouth daily.   Marland Kitchen gabapentin (NEURONTIN) 600 MG tablet Take 600 mg by mouth at bedtime. 10/05/2016: Patient reports she takes 600 mg at bedtime about every other day.    . lisinopril (PRINIVIL,ZESTRIL) 20 MG tablet Take 20 mg by mouth daily. 09/22/2015: Received from: External Pharmacy Received Sig:   . Magnesium Oxide (MAG-200 PO) Take 400 mg by mouth at bedtime.    . meloxicam (MOBIC) 15 MG tablet Take 15 mg by mouth daily as needed. Reported on 03/15/2016 09/22/2015: Received from: External Pharmacy Received Sig:   . metFORMIN (GLUCOPHAGE-XR) 500 MG 24 hr tablet Take 500 mg by mouth daily with breakfast.  10/05/2016: Patient reports PCP decreased from twice daily to once daily.    . methocarbamol (ROBAXIN) 500 MG tablet Take 500 mg by mouth 2 (two) times daily as needed for muscle spasms.   Marland Kitchen omeprazole (PRILOSEC) 40 MG  capsule Take 40 mg by mouth daily.   Marland Kitchen oxyCODONE-acetaminophen (PERCOCET/ROXICET) 5-325 MG tablet  08/09/2016: 1 tablet as needed for pain.  Marland Kitchen pyridOXINE (VITAMIN B-6) 100 MG tablet Take 100 mg by mouth daily.   Marland Kitchen spironolactone (ALDACTONE) 25 MG tablet Take 25 mg by mouth 2 (two) times daily. 10/05/2016: Patient reports taking once daily.    . Potassium 99 MG TABS Take 99 mg by mouth daily. 10/01/2015: Completed has stopped    No facility-administered encounter medications on file as of 12/20/2016.     Functional Status:  In your present state of health, do you have any difficulty performing the following activities: 08/09/2016  Hearing? N  Vision? N  Difficulty concentrating or making decisions? N  Walking or climbing stairs? N  Dressing or bathing? N  Doing errands, shopping? N  Preparing Food and eating ? N  Using the Toilet? N  In the past six months, have you accidently leaked urine? N  Do you have problems with loss of bowel control? N  Managing your Medications? N  Managing your Finances? N  Housekeeping or managing your Housekeeping? N  Some recent data might be hidden    Fall/Depression Screening: PHQ 2/9 Scores 12/20/2016 11/11/2016 10/11/2016 09/07/2016 08/09/2016 07/29/2016  PHQ - 2 Score 0 0 0 0 0 0    Assessment: Patient continues to benefit from health coach outreach for disease management and support.    Plan:  [facility] CM Care Plan Problem One   Flowsheet Row Most Recent Value  Care Plan Problem One  Diaetes Knowledge Deficit  Role Documenting the Problem One  [place] for Problem One  Active  THN Long Term Goal (31-90 days)  Patient will keep A1c less than 6.5 within 90 days.  THN Long Term Goal Start Date  12/20/16  Interventions for Problem One [place] reviewed with patient A1c and goal of keeping less than 6.5.     RN Health Coach will contact patient in the month of March and patient agrees to next outreach.  Jone Baseman, RN, MSN [place] [phone]

## 2016-12-27 DIAGNOSIS — E119 Type 2 diabetes mellitus without complications: Secondary | ICD-10-CM | POA: Diagnosis not present

## 2016-12-27 DIAGNOSIS — I420 Dilated cardiomyopathy: Secondary | ICD-10-CM | POA: Diagnosis not present

## 2016-12-27 DIAGNOSIS — E785 Hyperlipidemia, unspecified: Secondary | ICD-10-CM | POA: Diagnosis not present

## 2016-12-27 DIAGNOSIS — I119 Hypertensive heart disease without heart failure: Secondary | ICD-10-CM | POA: Diagnosis not present

## 2017-01-10 DIAGNOSIS — M545 Low back pain: Secondary | ICD-10-CM | POA: Diagnosis not present

## 2017-01-10 DIAGNOSIS — M7062 Trochanteric bursitis, left hip: Secondary | ICD-10-CM | POA: Diagnosis not present

## 2017-01-17 ENCOUNTER — Other Ambulatory Visit: Payer: Self-pay

## 2017-01-17 NOTE — Patient Outreach (Signed)
[place] [facility]) Care Management  [place]  01/17/2017   [name] 01-Apr-1961 [id number]  Subjective: Telephone call to patient for monthly call.  Patient reports she is doing good.  She reports that her blood sugars are doing good.  She did not give specific numbers.  Patient reports her appetite is better and that her weight is about 125 lbs.  Reinforced with patient continuing to follow her diet in order to maintain sugars. She verbalized understanding.    Objective:   Encounter Medications:  Outpatient Encounter Prescriptions as of 01/17/2017  Medication Sig Note  . aspirin 81 MG EC tablet Take 81 mg by mouth daily. Swallow whole.   Marland Kitchen atorvastatin (LIPITOR) 40 MG tablet  05/11/2016: Received from: External Pharmacy  . baclofen (LIORESAL) 10 MG tablet Take 10 mg by mouth 2 (two) times daily.  05/11/2016: Received from: External Pharmacy  . carvedilol (COREG) 25 MG tablet Take 25 mg by mouth 2 (two) times daily with a meal.    . folic acid (FOLVITE) 1 MG tablet Take 1 mg by mouth daily. 08/28/2012: --  . furosemide (LASIX) 40 MG tablet Take 1 tablet (40 mg total) by mouth daily.   Marland Kitchen gabapentin (NEURONTIN) 600 MG tablet Take 600 mg by mouth at bedtime. 10/05/2016: Patient reports she takes 600 mg at bedtime about every other day.    . lisinopril (PRINIVIL,ZESTRIL) 20 MG tablet Take 20 mg by mouth daily. 09/22/2015: Received from: External Pharmacy Received Sig:   . Magnesium Oxide (MAG-200 PO) Take 400 mg by mouth at bedtime.    . meloxicam (MOBIC) 15 MG tablet Take 15 mg by mouth daily as needed. Reported on 03/15/2016 09/22/2015: Received from: External Pharmacy Received Sig:   . metFORMIN (GLUCOPHAGE-XR) 500 MG 24 hr tablet Take 500 mg by mouth daily with breakfast.  10/05/2016: Patient reports PCP decreased from twice daily to once daily.    . methocarbamol (ROBAXIN) 500 MG tablet Take 500 mg by mouth 2 (two) times daily as needed for muscle spasms.   Marland Kitchen  omeprazole (PRILOSEC) 40 MG capsule Take 40 mg by mouth daily.   Marland Kitchen oxyCODONE-acetaminophen (PERCOCET/ROXICET) 5-325 MG tablet  08/09/2016: 1 tablet as needed for pain.  Marland Kitchen pyridOXINE (VITAMIN B-6) 100 MG tablet Take 100 mg by mouth daily.   Marland Kitchen spironolactone (ALDACTONE) 25 MG tablet Take 25 mg by mouth 2 (two) times daily. 10/05/2016: Patient reports taking once daily.    . Potassium 99 MG TABS Take 99 mg by mouth daily. 10/01/2015: Completed has stopped    No facility-administered encounter medications on file as of 01/17/2017.     Functional Status:  In your present state of health, do you have any difficulty performing the following activities: 08/09/2016  Hearing? N  Vision? N  Difficulty concentrating or making decisions? N  Walking or climbing stairs? N  Dressing or bathing? N  Doing errands, shopping? N  Preparing Food and eating ? N  Using the Toilet? N  In the past six months, have you accidently leaked urine? N  Do you have problems with loss of bowel control? N  Managing your Medications? N  Managing your Finances? N  Housekeeping or managing your Housekeeping? N  Some recent data might be hidden    Fall/Depression Screening: PHQ 2/9 Scores 01/17/2017 12/20/2016 11/11/2016 10/11/2016 09/07/2016 08/09/2016 07/29/2016  PHQ - 2 Score 0 0 0 0 0 0 0    Assessment: Patient continues to benefit from health coach outreach for  disease management and support.   Plan:  [facility] CM Care Plan Problem One     Most Recent Value  Care Plan Problem One  Diaetes Knowledge Deficit  Role Documenting the Problem One  [place] for Problem One  Active  THN Long Term Goal (31-90 days)  Patient will keep A1c less than 6.5 within 90 days.  THN Long Term Goal Start Date  01/17/17 [goal continued]  Interventions for Problem One Long Term Goal  RN Health Coach reinforced with patient A1c and goal of keeping less than 6.5.     RN Health Coach will contact patient in the month of April and  patient agrees to next outreach.  Jone Baseman, RN, MSN [place] [phone]

## 2017-02-01 ENCOUNTER — Other Ambulatory Visit: Payer: Self-pay | Admitting: *Deleted

## 2017-02-01 DIAGNOSIS — D333 Benign neoplasm of cranial nerves: Secondary | ICD-10-CM

## 2017-02-14 ENCOUNTER — Other Ambulatory Visit: Payer: Self-pay

## 2017-02-14 NOTE — Patient Outreach (Signed)
[place] [facility]) Care Management  02/14/2017  [name] 1961-03-19 [id number]   Telephone call to patient for monthly call.  No answer.  HIPAA compliant voice message left.    Plan: RN Health Coach will attempt patient again in the month of April.  Jone Baseman, RN, MSN [place] [phone]

## 2017-02-17 DIAGNOSIS — M7062 Trochanteric bursitis, left hip: Secondary | ICD-10-CM | POA: Diagnosis not present

## 2017-02-28 ENCOUNTER — Other Ambulatory Visit: Payer: Self-pay

## 2017-02-28 NOTE — Patient Outreach (Signed)
[place] [facility]) Care Management  02/28/2017  JAHNAI SLINGERLAND 1961/03/01 [id number]   2nd telephone call to patient for monthly call.  Patient answers stating she cannot talk right now and could she get back with me.  Advised patient that would be fine or I will try to call her another day.  She verbalized understanding.    Plan: RN Health Coach will attempt patient in the month of May.    Jone Baseman, RN, MSN [place] [phone]

## 2017-03-09 NOTE — Progress Notes (Addendum)
[name] 56 y.o. woman with Acoustic Neuroma, review 03-11-17 MRI brain w wo contrast,one year FU.                Headaches:Denies having headaches," stated she had a headache after her MRI."             Nausea: Denies nausea unless she eats foods that may cause nausea.             Dizziness/ataxia: Had episode of dizziness yesterday for a second then she was okay.             Difficulty with hand coordination: No problems             Focal numbness/weakness: None             Visual deficits/changes:No problems             Confusion/Memory deficits:Feels she is forgetful,even before her treatment, issues with short term memory.  Pt alert & oriented x 3 with fluent speech.              Wt Readings from Last 3 Encounters:  03/14/17 128 lb 3.2 oz (58.2 kg)  05/11/16 136 lb (61.7 kg)  03/15/16 143 lb 11.2 oz (65.2 kg)  Blood pressure 116/85, pulse 69, temperature 98.2 F (36.8 C), temperature source Oral, resp. rate 18, height 5\' 5"  (1.651 m), weight 128 lb 3.2 oz (58.2 kg), SpO2 100 %.

## 2017-03-10 ENCOUNTER — Ambulatory Visit
Admission: RE | Admit: 2017-03-10 | Discharge: 2017-03-10 | Disposition: A | Discharge status: Home / Self Care | Payer: Medicare Other | Source: Ambulatory Visit | Attending: Radiation Oncology | Admitting: Radiation Oncology

## 2017-03-10 DIAGNOSIS — H04123 Dry eye syndrome of bilateral lacrimal glands: Secondary | ICD-10-CM | POA: Diagnosis not present

## 2017-03-10 DIAGNOSIS — H2512 Age-related nuclear cataract, left eye: Secondary | ICD-10-CM | POA: Diagnosis not present

## 2017-03-10 DIAGNOSIS — H25811 Combined forms of age-related cataract, right eye: Secondary | ICD-10-CM | POA: Diagnosis not present

## 2017-03-10 DIAGNOSIS — D333 Benign neoplasm of cranial nerves: Secondary | ICD-10-CM | POA: Diagnosis not present

## 2017-03-10 DIAGNOSIS — E119 Type 2 diabetes mellitus without complications: Secondary | ICD-10-CM | POA: Diagnosis not present

## 2017-03-10 DIAGNOSIS — H31092 Other chorioretinal scars, left eye: Secondary | ICD-10-CM | POA: Diagnosis not present

## 2017-03-10 MED ORDER — GADOBENATE DIMEGLUMINE 529 MG/ML IV SOLN
10.0000 mL | Freq: Once | INTRAVENOUS | Status: AC | PRN
  Administered 2017-03-10: 10 mL via INTRAVENOUS

## 2017-03-14 ENCOUNTER — Telehealth: Payer: Self-pay | Admitting: *Deleted

## 2017-03-14 ENCOUNTER — Encounter: Payer: Self-pay | Admitting: Radiation Oncology

## 2017-03-14 ENCOUNTER — Ambulatory Visit
Admission: RE | Admit: 2017-03-14 | Discharge: 2017-03-14 | Disposition: A | Discharge status: Home / Self Care | Payer: Medicare Other | Source: Ambulatory Visit | Attending: Radiation Oncology | Admitting: Radiation Oncology

## 2017-03-14 VITALS — BP 116/85 | HR 69 | Temp 98.2°F | Resp 18 | Ht 65.0 in | Wt 128.2 lb

## 2017-03-14 DIAGNOSIS — I509 Heart failure, unspecified: Secondary | ICD-10-CM | POA: Insufficient documentation

## 2017-03-14 DIAGNOSIS — Z720 Tobacco use: Secondary | ICD-10-CM | POA: Diagnosis not present

## 2017-03-14 DIAGNOSIS — E119 Type 2 diabetes mellitus without complications: Secondary | ICD-10-CM | POA: Insufficient documentation

## 2017-03-14 DIAGNOSIS — I429 Cardiomyopathy, unspecified: Secondary | ICD-10-CM | POA: Diagnosis not present

## 2017-03-14 DIAGNOSIS — E7800 Pure hypercholesterolemia, unspecified: Secondary | ICD-10-CM | POA: Diagnosis not present

## 2017-03-14 DIAGNOSIS — F1910 Other psychoactive substance abuse, uncomplicated: Secondary | ICD-10-CM | POA: Diagnosis not present

## 2017-03-14 DIAGNOSIS — I110 Hypertensive heart disease with heart failure: Secondary | ICD-10-CM | POA: Insufficient documentation

## 2017-03-14 DIAGNOSIS — K219 Gastro-esophageal reflux disease without esophagitis: Secondary | ICD-10-CM | POA: Insufficient documentation

## 2017-03-14 DIAGNOSIS — D333 Benign neoplasm of cranial nerves: Secondary | ICD-10-CM | POA: Insufficient documentation

## 2017-03-14 NOTE — Progress Notes (Addendum)
Radiation Oncology         [phone] ________________________________  Name: [name] MRN: [id number]  Date: 03/14/2017  DOB: 19-Apr-1961  Follow-Up Visit Note  CC: Cassie Chard, MD  Cassie Lose, MD  Diagnosis:  Left  Acoustic Neuroma  Narrative:  [name] is a pleasant 56 y.o. woman with a history of a left acoustic neuroma. She was originally found to have this process after undergoing an unrelated surgery and had trouble with complications following this surgery which prompted an evaluation with neurology. It is unclear what prompted a brain scan, however this was ordered and revealed a 4 x 5 mm enhancing mass in the left internal auditory canal. She was offered radiosurgery, though has elected to forgo any treatment. She's been followed with stability of this on serial imaging. She had an MRI of the brain on  03/11/17 which again showed stability of this lesion. She comes today to discuss these findings.  On review of systems, the patient reports that she is doing well overall. She reports a headache after her MRI and reports that loud noises, and stress can bring on a headache. She denies any regularity of headaches otherwise. She has not had a formal hearing test with audiology in over 10 years. She denies any chest pain, shortness of breath, cough, fevers, chills, night sweats, unintended weight changes. She denies any bowel or bladder disturbances, and denies abdominal pain, nausea or vomiting. She  denies any new musculoskeletal or joint aches or pains, new skin lesions or concerns. A complete review of systems is obtained and is otherwise negative.   Past Medical History:  Past Medical History:  Diagnosis Date  . Arthritis   . Bowel obstruction ([place])   . Bronchitis   . CHF (congestive heart failure) ([place])   . Chronic systolic dysfunction of left ventricle   . Diabetes mellitus without complication (HCC)    on oral meds  . GERD (gastroesophageal reflux  disease)   . Headache(784.0)    migraines, hasn't had one for over a year  . History of noncompliance with medical treatment   . Hypercholesteremia   . Hypertension   . LVH (left ventricular hypertrophy)   . Nonischemic cardiomyopathy ([place])   . Polysubstance abuse   . Tobacco abuse disorder     Past Surgical History: Past Surgical History:  Procedure Laterality Date  . ABDOMINAL HYSTERECTOMY    . ABDOMINAL SURGERY    . STRABISMUS SURGERY Left 07/31/2013   Procedure: REPAIR STRABISMUS;  Surgeon: Derry Skill, MD;  Location: [facility];  Service: Ophthalmology;  Laterality: Left;  EYE MUSCLE SURGERY LEFT EYE  . UTERINE FIBROID SURGERY      Social History:  Social History   Social History  . Marital status: Single    Spouse name: N/A  . Number of children: 0  . Years of education: 12   Occupational History  . N/A    Social History Main Topics  . Smoking status: Former Smoker    Packs/day: 0.50    Years: 30.00    Quit date: 07/02/2012  . Smokeless tobacco: Never Used     Comment: she is not ready to quit  . Alcohol use 0.0 oz/week     Comment: only one drink in past 2 months, States rarely drinks now. Used to be a daily drinker  . Drug use: Yes  . Sexual activity: Not on file   Other Topics Concern  . Not on file   Social  History Narrative   Lives in [place]   Disabled   Occasional caffeine use     Family History: Family History  Problem Relation Age of Onset  . Diabetes Mother   . Hypertension Mother   . Heart disease Mother   . Heart disease Father   . Diabetes Father   . Hypertension Father      ALLERGIES:  is allergic to codeine.  Meds: Current Outpatient Prescriptions  Medication Sig Dispense Refill  . aspirin 81 MG EC tablet Take 81 mg by mouth daily. Swallow whole.    Marland Kitchen atorvastatin (LIPITOR) 40 MG tablet     . baclofen (LIORESAL) 10 MG tablet Take 10 mg by mouth 2 (two) times daily.     . carvedilol (COREG) 25 MG tablet Take 25 mg by mouth  2 (two) times daily with a meal.     . Cholecalciferol (VITAMIN D3) 2000 units TABS Take 2,000 Units/day by mouth every morning.    . folic acid (FOLVITE) 1 MG tablet Take 1 mg by mouth daily.    . furosemide (LASIX) 40 MG tablet Take 1 tablet (40 mg total) by mouth daily. 30 tablet 0  . lisinopril (PRINIVIL,ZESTRIL) 20 MG tablet Take 20 mg by mouth daily.    . Magnesium Oxide (MAG-200 PO) Take 400 mg by mouth at bedtime.     . meloxicam (MOBIC) 15 MG tablet Take 15 mg by mouth daily as needed. Reported on 03/15/2016    . metFORMIN (GLUCOPHAGE-XR) 500 MG 24 hr tablet Take 500 mg by mouth daily with breakfast.     . Nutritional Supplements (L-GLUTAMINE/CHOLINE/INOSITOL) 500-400-50 MG TABS Take 500 mg by mouth 2 (two) times daily. Taking 500 mg/500 mg bid    . omeprazole (PRILOSEC) 40 MG capsule Take 40 mg by mouth daily.    Marland Kitchen pyridOXINE (VITAMIN B-6) 100 MG tablet Take 100 mg by mouth daily.    Marland Kitchen spironolactone (ALDACTONE) 25 MG tablet Take 25 mg by mouth 2 (two) times daily.    Marland Kitchen gabapentin (NEURONTIN) 600 MG tablet Take 600 mg by mouth at bedtime.    . methocarbamol (ROBAXIN) 500 MG tablet Take 500 mg by mouth 2 (two) times daily as needed for muscle spasms.    . Potassium 99 MG TABS Take 99 mg by mouth daily.     No current facility-administered medications for this encounter.     Physical Findings: Wt Readings from Last 3 Encounters:  03/14/17 128 lb 3.2 oz (58.2 kg)  05/11/16 136 lb (61.7 kg)  03/15/16 143 lb 11.2 oz (65.2 kg)   Temp Readings from Last 3 Encounters:  03/14/17 98.2 F (36.8 C) (Oral)  03/15/16 98.2 F (36.8 C) (Oral)  10/01/15 98 F (36.7 C) (Oral)   BP Readings from Last 3 Encounters:  03/14/17 116/85  05/11/16 112/82  03/15/16 96/62   Pulse Readings from Last 3 Encounters:  03/14/17 69  05/11/16 80  03/15/16 72   Pain Assessment Pain Score: 0-No pain/10 In general this is a well appearing African American female in no acute distress. She's alert and  oriented x4 and appropriate throughout the examination. Cardiopulmonary assessment is negative for acute distress and she exhibits normal effort.   Lab Findings: Lab Results  Component Value Date   WBC 7.7 09/22/2015   HGB 12.4 09/22/2015   HCT 38.1 09/22/2015   MCV 89.0 09/22/2015   PLT 201 09/22/2015     Radiographic Findings: Mr Jeri Cos YI Contrast  Result Date: 03/10/2017  CLINICAL DATA:  56 y/o female with small left IAC suspected vestibular schwannoma. Increased left side headaches. Restaging. Creatinine was obtained on site at [facility] at [address] Results: Creatinine 1.2 mg/dL. EXAM: MRI HEAD WITHOUT AND WITH CONTRAST TECHNIQUE: Multiplanar, multiecho pulse sequences of the brain and surrounding structures were obtained without and with intravenous contrast. CONTRAST:  74mL MULTIHANCE GADOBENATE DIMEGLUMINE 529 MG/ML IV SOLN COMPARISON:  Brain MRI 03/10/2016. Outside ([facility]) brain MRI 08/25/2015 available on [facility] PACS FINDINGS: Brain: Cerebral volume remains normal. No restricted diffusion to suggest acute infarction. No midline shift, mass effect, ventriculomegaly, extra-axial collection or acute intracranial hemorrhage. Cervicomedullary junction and pituitary are within normal limits. Scattered small mostly subcortical cerebral white matter foci of T2 and FLAIR hyperintensity appears stable since 2017, more numerous in the left hemisphere. No cortical encephalomalacia. Deep gray matter nuclei, brainstem and cerebellum remain within normal limits. Excluding the left IAC findings - detailed below - there is no abnormal intracranial enhancement. No dural thickening. Vascular: Major intracranial vascular flow voids are stable. Skull and upper cervical spine: Negative. Normal bone marrow signal. Sinuses/Orbits: Stable and negative orbits soft tissues. Paranasal sinuses remain well pneumatized, mild inferior sphenoid sinus mucosal thickening. Negative scalp  soft tissues. Other: Mastoid air cells remain clear. Normal cerebellopontine angles. Preserved T2 signal in the bilateral cochlea and vestibular structures. Right side cisternal and intracanalicular 7th and 8th cranial nerve segments remain normal. Nodular 6 mm long by 5 mm diameter left side intracanalicular enhancing mass remain stable since the outside 2016 study. This occupies the central aspect of the left IAC, sparing the fundus and not involving the left cochlea, vestibule, or the left CP angle. No other abnormal left IAC enhancement. Normal stylomastoid foramina. Normal visible parotid glands. IMPRESSION: 1. 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 2. Negative IAC imaging otherwise. 3. Stable MRI appearance of the brain. No acute intracranial abnormality. Electronically Signed   By: Genevie Ann M.D.   On: 03/10/2017 10:46    Impression/Plan: 1. Left Acoustic Neuroma. The patient appears to be doing well clinically and radiographically. Her case was discussed in brain oncology conference this morning, and recommendations were made to continue with annual surveillance MRIs. We will also refer her for formal audiology assessment to have a baseline hearing documented. She will inform us of progressive symptoms of headache or auditory changes, or if she has any other questions or concerns regarding her care.     Carola Rhine, PAC

## 2017-03-14 NOTE — Telephone Encounter (Signed)
Called patient to inform of ENT appt. with Dr. Melida Quitter- arrival time - 1:50 pm with Dr. Melida Quitter, spoke with patient and she is aware of this appt.

## 2017-03-14 NOTE — Addendum Note (Signed)
Encounter addended by: Malena Edman, RN on: 03/14/2017 12:55 PM<BR>    Actions taken: Charge Capture section accepted

## 2017-03-15 ENCOUNTER — Other Ambulatory Visit: Payer: Self-pay

## 2017-03-15 NOTE — Patient Outreach (Signed)
[facility]) Care Management  [place]  03/15/2017   [name] 06-18-1961 [id number]  Subjective: Telephone call to patient for case closure.  Patient reports she continues to do well.  She reports that her sugars are good.  Discussed with patient importance of keeping sugars under control.  She verbalized understanding.  Discussed with patient case closure as she has kept with her goals.  Patient agrees.  Advised patient on [facility] contact information for future reference.    Objective:   Encounter Medications:  Outpatient Encounter Prescriptions as of 03/15/2017  Medication Sig Note  . aspirin 81 MG EC tablet Take 81 mg by mouth daily. Swallow whole.   Marland Kitchen atorvastatin (LIPITOR) 40 MG tablet  05/11/2016: Received from: External Pharmacy  . baclofen (LIORESAL) 10 MG tablet Take 10 mg by mouth 2 (two) times daily.  05/11/2016: Received from: External Pharmacy  . carvedilol (COREG) 25 MG tablet Take 25 mg by mouth 2 (two) times daily with a meal.    . Cholecalciferol (VITAMIN D3) 2000 units TABS Take 2,000 Units/day by mouth every morning.   . folic acid (FOLVITE) 1 MG tablet Take 1 mg by mouth daily. 08/28/2012: --  . furosemide (LASIX) 40 MG tablet Take 1 tablet (40 mg total) by mouth daily.   Marland Kitchen gabapentin (NEURONTIN) 600 MG tablet Take 600 mg by mouth at bedtime. 10/05/2016: Patient reports she takes 600 mg at bedtime about every other day.    . lisinopril (PRINIVIL,ZESTRIL) 20 MG tablet Take 20 mg by mouth daily. 09/22/2015: Received from: External Pharmacy Received Sig:   . Magnesium Oxide (MAG-200 PO) Take 400 mg by mouth at bedtime.    . meloxicam (MOBIC) 15 MG tablet Take 15 mg by mouth daily as needed. Reported on 03/15/2016 09/22/2015: Received from: External Pharmacy Received Sig:   . metFORMIN (GLUCOPHAGE-XR) 500 MG 24 hr tablet Take 500 mg by mouth daily with breakfast.  10/05/2016: Patient reports PCP decreased from twice daily to once daily.    .  methocarbamol (ROBAXIN) 500 MG tablet Take 500 mg by mouth 2 (two) times daily as needed for muscle spasms.   . Nutritional Supplements (L-GLUTAMINE/CHOLINE/INOSITOL) 500-400-50 MG TABS Take 500 mg by mouth 2 (two) times daily. Taking 500 mg/500 mg bid   . omeprazole (PRILOSEC) 40 MG capsule Take 40 mg by mouth daily.   Marland Kitchen pyridOXINE (VITAMIN B-6) 100 MG tablet Take 100 mg by mouth daily.   Marland Kitchen spironolactone (ALDACTONE) 25 MG tablet Take 25 mg by mouth 2 (two) times daily. 10/05/2016: Patient reports taking once daily.    . Potassium 99 MG TABS Take 99 mg by mouth daily. 10/01/2015: Completed has stopped    No facility-administered encounter medications on file as of 03/15/2017.     Functional Status:  In your present state of health, do you have any difficulty performing the following activities: 08/09/2016  Hearing? N  Vision? N  Difficulty concentrating or making decisions? N  Walking or climbing stairs? N  Dressing or bathing? N  Doing errands, shopping? N  Preparing Food and eating ? N  Using the Toilet? N  In the past six months, have you accidently leaked urine? N  Do you have problems with loss of bowel control? N  Managing your Medications? N  Managing your Finances? N  Housekeeping or managing your Housekeeping? N  Some recent data might be hidden    Fall/Depression Screening: Fall Risk  03/15/2017 03/14/2017 01/17/2017  Falls in the past  year? No No Yes  Number falls in past yr: - - -  Injury with Fall? - - -  Risk for fall due to : - - -  Follow up - - -   PHQ 2/9 Scores 03/15/2017 03/14/2017 01/17/2017 12/20/2016 11/11/2016 10/11/2016 09/07/2016  PHQ - 2 Score 0 0 0 0 0 0 0    Assessment: Patient continues to meet goals and is ready for case closure.    Plan:  [facility] CM Care Plan Problem One     Most Recent Value  Care Plan Problem One  [place] Knowledge Deficit  Role Documenting the Problem One  [place] for Problem One  Active  [facility] Long Term Goal (31-90  days)  Patient will keep A1c less than 6.5 within 90 days.  [facility] Long Term Goal Start Date  01/17/17 [goal continued]  [facility] Long Term Goal Met Date  03/15/17  Interventions for Problem One Long Term Goal  RN Health Coach reinforced with patient A1c and goal of keeping less than 6.5.     RN Health Coach will send case closure letter to physician.   RN Health Coach will notify care management assistant of case closure.    Jone Baseman, RN, MSN [facility] [phone]

## 2017-03-30 DIAGNOSIS — E785 Hyperlipidemia, unspecified: Secondary | ICD-10-CM | POA: Diagnosis not present

## 2017-03-30 DIAGNOSIS — I420 Dilated cardiomyopathy: Secondary | ICD-10-CM | POA: Diagnosis not present

## 2017-03-30 DIAGNOSIS — I119 Hypertensive heart disease without heart failure: Secondary | ICD-10-CM | POA: Diagnosis not present

## 2017-03-30 DIAGNOSIS — E119 Type 2 diabetes mellitus without complications: Secondary | ICD-10-CM | POA: Diagnosis not present

## 2017-04-08 DIAGNOSIS — D333 Benign neoplasm of cranial nerves: Secondary | ICD-10-CM | POA: Diagnosis not present

## 2017-04-13 DIAGNOSIS — I5020 Unspecified systolic (congestive) heart failure: Secondary | ICD-10-CM | POA: Diagnosis not present

## 2017-04-13 DIAGNOSIS — N08 Glomerular disorders in diseases classified elsewhere: Secondary | ICD-10-CM | POA: Diagnosis not present

## 2017-04-13 DIAGNOSIS — E1122 Type 2 diabetes mellitus with diabetic chronic kidney disease: Secondary | ICD-10-CM | POA: Diagnosis not present

## 2017-04-13 DIAGNOSIS — N182 Chronic kidney disease, stage 2 (mild): Secondary | ICD-10-CM | POA: Diagnosis not present

## 2017-04-20 DIAGNOSIS — M79671 Pain in right foot: Secondary | ICD-10-CM | POA: Diagnosis not present

## 2017-04-20 DIAGNOSIS — G5761 Lesion of plantar nerve, right lower limb: Secondary | ICD-10-CM | POA: Diagnosis not present

## 2017-05-12 DIAGNOSIS — G5761 Lesion of plantar nerve, right lower limb: Secondary | ICD-10-CM | POA: Diagnosis not present

## 2017-05-12 DIAGNOSIS — G5762 Lesion of plantar nerve, left lower limb: Secondary | ICD-10-CM | POA: Diagnosis not present

## 2017-06-24 DIAGNOSIS — I5020 Unspecified systolic (congestive) heart failure: Secondary | ICD-10-CM | POA: Diagnosis not present

## 2017-06-24 DIAGNOSIS — R0789 Other chest pain: Secondary | ICD-10-CM | POA: Diagnosis not present

## 2017-06-24 DIAGNOSIS — E119 Type 2 diabetes mellitus without complications: Secondary | ICD-10-CM | POA: Diagnosis not present

## 2017-06-24 DIAGNOSIS — I119 Hypertensive heart disease without heart failure: Secondary | ICD-10-CM | POA: Diagnosis not present

## 2017-06-24 DIAGNOSIS — I420 Dilated cardiomyopathy: Secondary | ICD-10-CM | POA: Diagnosis not present

## 2017-07-14 DIAGNOSIS — N182 Chronic kidney disease, stage 2 (mild): Secondary | ICD-10-CM | POA: Diagnosis not present

## 2017-07-14 DIAGNOSIS — E1122 Type 2 diabetes mellitus with diabetic chronic kidney disease: Secondary | ICD-10-CM | POA: Diagnosis not present

## 2017-07-14 DIAGNOSIS — I5020 Unspecified systolic (congestive) heart failure: Secondary | ICD-10-CM | POA: Diagnosis not present

## 2017-07-14 DIAGNOSIS — N08 Glomerular disorders in diseases classified elsewhere: Secondary | ICD-10-CM | POA: Diagnosis not present

## 2017-07-14 DIAGNOSIS — Z23 Encounter for immunization: Secondary | ICD-10-CM | POA: Diagnosis not present

## 2017-08-30 ENCOUNTER — Telehealth: Payer: Self-pay | Admitting: *Deleted

## 2017-08-30 ENCOUNTER — Other Ambulatory Visit: Payer: Self-pay | Admitting: Sports Medicine

## 2017-08-30 ENCOUNTER — Ambulatory Visit (INDEPENDENT_AMBULATORY_CARE_PROVIDER_SITE_OTHER): Payer: Medicare Other | Admitting: Sports Medicine

## 2017-08-30 ENCOUNTER — Encounter: Payer: Self-pay | Admitting: Neurology

## 2017-08-30 ENCOUNTER — Ambulatory Visit (INDEPENDENT_AMBULATORY_CARE_PROVIDER_SITE_OTHER): Payer: Medicare Other

## 2017-08-30 DIAGNOSIS — R200 Anesthesia of skin: Secondary | ICD-10-CM

## 2017-08-30 DIAGNOSIS — Q72899 Other reduction defects of unspecified lower limb: Secondary | ICD-10-CM

## 2017-08-30 DIAGNOSIS — M21619 Bunion of unspecified foot: Secondary | ICD-10-CM

## 2017-08-30 DIAGNOSIS — M79672 Pain in left foot: Secondary | ICD-10-CM

## 2017-08-30 DIAGNOSIS — M216X1 Other acquired deformities of right foot: Secondary | ICD-10-CM | POA: Diagnosis not present

## 2017-08-30 DIAGNOSIS — M79671 Pain in right foot: Secondary | ICD-10-CM

## 2017-08-30 DIAGNOSIS — E1141 Type 2 diabetes mellitus with diabetic mononeuropathy: Secondary | ICD-10-CM | POA: Diagnosis not present

## 2017-08-30 DIAGNOSIS — R0989 Other specified symptoms and signs involving the circulatory and respiratory systems: Secondary | ICD-10-CM

## 2017-08-30 DIAGNOSIS — M216X2 Other acquired deformities of left foot: Secondary | ICD-10-CM | POA: Diagnosis not present

## 2017-08-30 NOTE — Telephone Encounter (Signed)
-----   Message from Landis Martins, [place] sent at 08/30/2017  1:16 PM EDT ----- Regarding: ABI/PVRs and NCV Chronic foot pain  History of left foot surgery with numbness  Issues of digital deformity with decreased pedal pulse -Dr. Cannon Kettle

## 2017-08-30 NOTE — Telephone Encounter (Signed)
Faxed NCV with EMG to [facility]. Faxed dopplers to [facility].

## 2017-08-30 NOTE — Progress Notes (Signed)
   Subjective:    Patient ID: [name], female    DOB: 06/23/1961, 56 y.o.   MRN: [id number]  56 y/o F patient with history of left foot surgery, patient states that she has pain at left forefoot and over toes that rub. Admits to history of fracture of 3rd toe. Reports that she has continued numbness and burning in toes and that is concerned and wants to discuss possible surgery. States that she is concerned because she had foot surgery on the left and her pain is still there and is wondering if it will help her right foot.       Review of Systems  All other systems reviewed and are negative.      Objective:   Physical Exam  Constitutional: She appears well-developed.  Cardiovascular: Exam reveals decreased pulses.   Musculoskeletal:       Right shoulder: She exhibits decreased range of motion, tenderness and deformity.  Pain to 4th toes bilateral at shortened contracted digits Pain at residual bunion on left and bunion on right Pes planus and splay foot type bilateral   Neurological:  Decreased sensation L>R foot with SWML  Skin:  Mild reactive callus sub met 2 bilateral without infection           Assessment & Plan:   Problem List Items Addressed This Visit    None    Visit Diagnoses    Foot pain, bilateral    -  Primary   Relevant Orders   DG Foot Complete Left (Completed)   Neuritis due to diabetes mellitus (HCC)       Diminished pulses in lower extremity       Acquired equinus deformity of both feet       Bunion       Brachymetatarsia         -Patient seen and evaluated -Xrays reviewed reveal significant deformity -Recommend further testing of circulation to toes and NCV before further discussing any type of surgery since pain is out of proportion  -Recommend good supportive shoes daily -Encouraged smoking cessation  -Patient to follow after testing; office to call with results.  Landis Martins, DPM

## 2017-08-31 ENCOUNTER — Other Ambulatory Visit: Payer: Self-pay | Admitting: *Deleted

## 2017-08-31 DIAGNOSIS — R200 Anesthesia of skin: Secondary | ICD-10-CM

## 2017-08-31 DIAGNOSIS — R208 Other disturbances of skin sensation: Secondary | ICD-10-CM

## 2017-09-01 DIAGNOSIS — M25562 Pain in left knee: Secondary | ICD-10-CM | POA: Diagnosis not present

## 2017-09-06 ENCOUNTER — Other Ambulatory Visit: Payer: Self-pay | Admitting: Internal Medicine

## 2017-09-06 DIAGNOSIS — Z1231 Encounter for screening mammogram for malignant neoplasm of breast: Secondary | ICD-10-CM

## 2017-09-07 ENCOUNTER — Ambulatory Visit (HOSPITAL_COMMUNITY)
Admission: RE | Admit: 2017-09-07 | Discharge: 2017-09-07 | Disposition: A | Discharge status: Home / Self Care | Payer: Medicare Other | Source: Ambulatory Visit | Attending: Cardiology | Admitting: Cardiology

## 2017-09-07 DIAGNOSIS — R0989 Other specified symptoms and signs involving the circulatory and respiratory systems: Secondary | ICD-10-CM | POA: Diagnosis not present

## 2017-09-09 DIAGNOSIS — H9202 Otalgia, left ear: Secondary | ICD-10-CM | POA: Diagnosis not present

## 2017-09-09 DIAGNOSIS — R42 Dizziness and giddiness: Secondary | ICD-10-CM | POA: Diagnosis not present

## 2017-09-15 ENCOUNTER — Ambulatory Visit (INDEPENDENT_AMBULATORY_CARE_PROVIDER_SITE_OTHER): Payer: Medicare Other | Admitting: Neurology

## 2017-09-15 DIAGNOSIS — R200 Anesthesia of skin: Secondary | ICD-10-CM

## 2017-09-15 DIAGNOSIS — R208 Other disturbances of skin sensation: Secondary | ICD-10-CM | POA: Diagnosis not present

## 2017-09-15 NOTE — Procedures (Signed)
[facility]  [place]  [place], [place] [address] Tel: [phone] Fax:  [phone] Test Date:  09/15/2017  Patient: [name] DOB: Oct 10, 1961 Physician: Narda Amber, DO  Sex: Female Height: 5\' 5"  Ref Phys: Landis Martins, DPM  ID#: [id number] Temp: 32.4C Technician:    Patient Complaints: This is a 56 year old female referred for evaluation of bilateral feet pain and numbness.  NCV & EMG Findings: Extensive electrodiagnostic testing of the left lower extremity and additional studies of the right shows:  1. Bilateral sural and superficial peroneal sensory responses are within normal limits. 2. Bilateral peroneal motor responses are within normal limits. Bilateral tibial motor responses show mildly reduced reduced amplitude (L2.2, R3.4 mV); in isolation, these findings are uncertain clinical significance and may be due to localize compression from shoe wearing. 3. Bilateral tibial H reflex studies are within normal limits. 4. There is no evidence of active or chronic motor axon loss changes affecting any of the tested muscles. Motor unit configuration and recruitment pattern is within normal limits.  Impression: This is a normal study. In particular, there is no evidence of a large fiber sensorimotor polyneuropathy or lumbosacral radiculopathy affecting the lower extremities.   ___________________________ Narda Amber, DO    Nerve Conduction Studies Anti Sensory Summary Table   Site NR Peak (ms) Norm Peak (ms) P-T Amp (V) Norm P-T Amp  Left Sup Peroneal Anti Sensory (Ant Lat Mall)  12 cm    2.7 <4.6 5.2 >4  Right Sup Peroneal Anti Sensory (Ant Lat Mall)  12 cm    2.5 <4.6 8.9 >4  Left Sural Anti Sensory (Lat Mall)  Calf    4.1 <4.6 9.7 >4  Right Sural Anti Sensory (Lat Mall)  Calf    2.7 <4.6 10.0 >4   Motor Summary Table   Site NR Onset (ms) Norm Onset (ms) O-P Amp (mV) Norm O-P Amp Site1 Site2 Delta-0 (ms) Dist (cm) Vel (m/s) Norm  Vel (m/s)  Left Peroneal Motor (Ext Dig Brev)  Ankle    4.2 <6.0 2.9 >2.5 B Fib Ankle 8.7 37.0 43 >40  B Fib    12.9  2.5  Poplt B Fib 1.5 7.0 47 >40  Poplt    14.4  2.3         Right Peroneal Motor (Ext Dig Brev)  Ankle    3.7 <6.0 3.4 >2.5 B Fib Ankle 8.3 37.0 45 >40  B Fib    12.0  3.2  Poplt B Fib 1.4 10.0 71 >40  Poplt    13.4  2.8         Left Peroneal TA Motor (Tib Ant)  Fib Head    3.4 <4.5 4.9 >3 Poplit Fib Head 1.3 7.0 54 >40  Poplit    4.7  4.9         Left Tibial Motor (Abd Hall Brev)  Ankle    4.1 <6.0 2.2 >4 Knee Ankle 9.3 39.0 42 >40  Knee    13.4  2.0         Right Tibial Motor (Abd Hall Brev)  Ankle    4.0 <6.0 3.4 >4 Knee Ankle 9.4 39.0 41 >40  Knee    13.4  3.2          H Reflex Studies   NR H-Lat (ms) Lat Norm (ms) L-R H-Lat (ms)  Left Tibial (Gastroc)     33.06 <35 0.00  Right Tibial (Gastroc)     33.06 <35  0.00   EMG   Side Muscle Ins Act Fibs Psw Fasc Number Recrt Dur Dur. Amp Amp. Poly Poly. Comment  Left AntTibialis Nml Nml Nml Nml Nml Nml Nml Nml Nml Nml Nml Nml N/A  Left Gastroc Nml Nml Nml Nml Nml Nml Nml Nml Nml Nml Nml Nml N/A  Left Flex Dig Long Nml Nml Nml Nml Nml Nml Nml Nml Nml Nml Nml Nml N/A  Left RectFemoris Nml Nml Nml Nml Nml Nml Nml Nml Nml Nml Nml Nml N/A  Left GluteusMed Nml Nml Nml Nml Nml Nml Nml Nml Nml Nml Nml Nml N/A  Left BicepsFemS Nml Nml Nml Nml Nml Nml Nml Nml Nml Nml Nml Nml N/A  Right AntTibialis Nml Nml Nml Nml Nml Nml Nml Nml Nml Nml Nml Nml N/A  Right Gastroc Nml Nml Nml Nml Nml Nml Nml Nml Nml Nml Nml Nml N/A  Right BicepsFemS Nml Nml Nml Nml Nml Nml Nml Nml Nml Nml Nml Nml N/A      Waveforms:

## 2017-09-29 DIAGNOSIS — E785 Hyperlipidemia, unspecified: Secondary | ICD-10-CM | POA: Diagnosis not present

## 2017-09-29 DIAGNOSIS — I10 Essential (primary) hypertension: Secondary | ICD-10-CM | POA: Diagnosis not present

## 2017-09-29 DIAGNOSIS — E119 Type 2 diabetes mellitus without complications: Secondary | ICD-10-CM | POA: Diagnosis not present

## 2017-09-30 ENCOUNTER — Telehealth: Payer: Self-pay | Admitting: *Deleted

## 2017-09-30 NOTE — Telephone Encounter (Signed)
Pt states she had all her test done and she wanted to know when she needed to see Dr. Cannon Kettle. I told pt I would have a scheduler call her on Monday.

## 2017-10-04 ENCOUNTER — Encounter: Payer: Self-pay | Admitting: Sports Medicine

## 2017-10-04 ENCOUNTER — Ambulatory Visit
Admission: RE | Admit: 2017-10-04 | Discharge: 2017-10-04 | Disposition: A | Discharge status: Home / Self Care | Payer: Medicare Other | Source: Ambulatory Visit | Attending: Internal Medicine | Admitting: Internal Medicine

## 2017-10-04 ENCOUNTER — Ambulatory Visit: Payer: Medicare Other | Admitting: Sports Medicine

## 2017-10-04 DIAGNOSIS — Q72899 Other reduction defects of unspecified lower limb: Secondary | ICD-10-CM | POA: Diagnosis not present

## 2017-10-04 DIAGNOSIS — M216X2 Other acquired deformities of left foot: Secondary | ICD-10-CM | POA: Diagnosis not present

## 2017-10-04 DIAGNOSIS — I5020 Unspecified systolic (congestive) heart failure: Secondary | ICD-10-CM | POA: Diagnosis not present

## 2017-10-04 DIAGNOSIS — M79672 Pain in left foot: Secondary | ICD-10-CM

## 2017-10-04 DIAGNOSIS — Z1231 Encounter for screening mammogram for malignant neoplasm of breast: Secondary | ICD-10-CM

## 2017-10-04 DIAGNOSIS — E119 Type 2 diabetes mellitus without complications: Secondary | ICD-10-CM | POA: Diagnosis not present

## 2017-10-04 DIAGNOSIS — M79671 Pain in right foot: Secondary | ICD-10-CM | POA: Diagnosis not present

## 2017-10-04 DIAGNOSIS — M21619 Bunion of unspecified foot: Secondary | ICD-10-CM | POA: Diagnosis not present

## 2017-10-04 DIAGNOSIS — R200 Anesthesia of skin: Secondary | ICD-10-CM | POA: Diagnosis not present

## 2017-10-04 DIAGNOSIS — R42 Dizziness and giddiness: Secondary | ICD-10-CM | POA: Diagnosis not present

## 2017-10-04 DIAGNOSIS — I119 Hypertensive heart disease without heart failure: Secondary | ICD-10-CM | POA: Diagnosis not present

## 2017-10-04 DIAGNOSIS — R2689 Other abnormalities of gait and mobility: Secondary | ICD-10-CM | POA: Insufficient documentation

## 2017-10-04 DIAGNOSIS — I420 Dilated cardiomyopathy: Secondary | ICD-10-CM | POA: Diagnosis not present

## 2017-10-04 DIAGNOSIS — D333 Benign neoplasm of cranial nerves: Secondary | ICD-10-CM | POA: Diagnosis not present

## 2017-10-04 DIAGNOSIS — E785 Hyperlipidemia, unspecified: Secondary | ICD-10-CM | POA: Diagnosis not present

## 2017-10-04 NOTE — Progress Notes (Signed)
Subjective: [name] is a 56 y.o. female patient who presents to office for evaluation of Right and left foot pain. Patient complains of continued pain and numbness. Had ABI and NCV studies and is here to discuss the results and plan of care. In past has tried topicals, medications, creams, rubs, cushions, change of shoes with no improvement.  Patient Active Problem List   Diagnosis Date Noted  . Diabetes ([place]) 08/09/2016  . Left acoustic neuroma ([place]) 10/03/2015  . Chronic systolic dysfunction of left ventricle 11/24/2011  . ACS (acute coronary syndrome) ([place]) 11/23/2011  . CHF, acute ([place]) 11/23/2011  . Cocaine abuse ([place]) 11/23/2011  . Tobacco abuse 11/23/2011  . Pneumonitis, aspiration ([place]) 11/23/2011  . Cardiomyopathy 11/23/2011  . HTN (hypertension), malignant 11/23/2011    Current Outpatient Medications on File Prior to Visit  Medication Sig Dispense Refill  . aspirin 81 MG EC tablet Take 81 mg by mouth daily. Swallow whole.    Marland Kitchen atorvastatin (LIPITOR) 40 MG tablet     . baclofen (LIORESAL) 10 MG tablet Take 10 mg by mouth 2 (two) times daily.     . carvedilol (COREG) 25 MG tablet Take 25 mg by mouth 2 (two) times daily with a meal.     . Cholecalciferol (VITAMIN D3) 2000 units TABS Take 2,000 Units/day by mouth every morning.    . folic acid (FOLVITE) 1 MG tablet Take 1 mg by mouth daily.    . furosemide (LASIX) 40 MG tablet Take 1 tablet (40 mg total) by mouth daily. 30 tablet 0  . gabapentin (NEURONTIN) 600 MG tablet Take 600 mg by mouth at bedtime.    Marland Kitchen lisinopril (PRINIVIL,ZESTRIL) 20 MG tablet Take 20 mg by mouth daily.    . Magnesium Oxide (MAG-200 PO) Take 400 mg by mouth at bedtime.     . meloxicam (MOBIC) 15 MG tablet Take 15 mg by mouth daily as needed. Reported on 03/15/2016    . metFORMIN (GLUCOPHAGE-XR) 500 MG 24 hr tablet Take 500 mg by mouth daily with breakfast.     . methocarbamol (ROBAXIN) 500 MG tablet Take 500 mg by mouth 2 (two) times daily as  needed for muscle spasms.    . Nutritional Supplements (L-GLUTAMINE/CHOLINE/INOSITOL) 500-400-50 MG TABS Take 500 mg by mouth 2 (two) times daily. Taking 500 mg/500 mg bid    . omeprazole (PRILOSEC) 40 MG capsule Take 40 mg by mouth daily.    . Potassium 99 MG TABS Take 99 mg by mouth daily.    Marland Kitchen pyridOXINE (VITAMIN B-6) 100 MG tablet Take 100 mg by mouth daily.    Marland Kitchen spironolactone (ALDACTONE) 25 MG tablet Take 25 mg by mouth 2 (two) times daily.     No current facility-administered medications on file prior to visit.     Allergies  Allergen Reactions  . Codeine Nausea And Vomiting    Objective:  General: Alert and oriented x3 in no acute distress  Dermatology: No open lesions bilateral lower extremities, no webspace macerations, no ecchymosis bilateral, all nails x 10 are well manicured. + Callus minimal sub met 2-3 on left.   Vascular: Dorsalis Pedis and Posterior Tibial pedal pulses palpable, Capillary Fill Time 3 seconds,(+) pedal hair growth bilateral, no edema bilateral lower extremities, Temperature gradient within normal limits.  Neurology: Gross sensation intact via light touch bilateral/  Musculoskeletal: Mild tenderness to right bunion grade 4 with residual deformity on left bunion, history of previous bunionectomy. Pain to short 4th toe bilateral and to  ball of left foot with prominent 2-3 metatarsal heads.   Gait: Antalgic gait  ABIs- Normal  NCV- Normal no large fiber involvement   Assessment and Plan: Problem List Items Addressed This Visit    None    Visit Diagnoses    Bunion    -  Primary   Brachymetatarsia       Foot pain, bilateral       Prominent metatarsal head, left       Numbness           -Complete examination performed -ABIs and NCV reviewed  -Discussed treatement options -Recommend continue with cushion, padding, wider shoes, topical creams and rubs; Advised patient to think about surgery. Bunionectomy on right, removal of 4th toe since she  does not want bone lenghtened, and met head resection on left; Patient wants to think about these options -Encouraged smoking cessation -Patient to return to office for surgical consult when ready or sooner if condition worsens.  Landis Martins, DPM

## 2017-10-14 DIAGNOSIS — E1122 Type 2 diabetes mellitus with diabetic chronic kidney disease: Secondary | ICD-10-CM | POA: Diagnosis not present

## 2017-10-14 DIAGNOSIS — Z0000 Encounter for general adult medical examination without abnormal findings: Secondary | ICD-10-CM | POA: Diagnosis not present

## 2017-10-14 DIAGNOSIS — N182 Chronic kidney disease, stage 2 (mild): Secondary | ICD-10-CM | POA: Diagnosis not present

## 2017-10-14 DIAGNOSIS — E559 Vitamin D deficiency, unspecified: Secondary | ICD-10-CM | POA: Diagnosis not present

## 2017-10-14 DIAGNOSIS — N08 Glomerular disorders in diseases classified elsewhere: Secondary | ICD-10-CM | POA: Diagnosis not present

## 2017-10-14 DIAGNOSIS — I130 Hypertensive heart and chronic kidney disease with heart failure and stage 1 through stage 4 chronic kidney disease, or unspecified chronic kidney disease: Secondary | ICD-10-CM | POA: Diagnosis not present

## 2017-12-21 DIAGNOSIS — H35342 Macular cyst, hole, or pseudohole, left eye: Secondary | ICD-10-CM | POA: Diagnosis not present

## 2017-12-21 DIAGNOSIS — E119 Type 2 diabetes mellitus without complications: Secondary | ICD-10-CM | POA: Diagnosis not present

## 2017-12-21 DIAGNOSIS — H40012 Open angle with borderline findings, low risk, left eye: Secondary | ICD-10-CM | POA: Diagnosis not present

## 2017-12-21 DIAGNOSIS — H25813 Combined forms of age-related cataract, bilateral: Secondary | ICD-10-CM | POA: Diagnosis not present

## 2017-12-21 DIAGNOSIS — H04123 Dry eye syndrome of bilateral lacrimal glands: Secondary | ICD-10-CM | POA: Diagnosis not present

## 2017-12-21 LAB — HM DIABETES EYE EXAM

## 2018-01-03 ENCOUNTER — Ambulatory Visit: Payer: Medicare Other | Admitting: Sports Medicine

## 2018-01-03 DIAGNOSIS — M79672 Pain in left foot: Secondary | ICD-10-CM

## 2018-01-03 DIAGNOSIS — M21619 Bunion of unspecified foot: Secondary | ICD-10-CM | POA: Diagnosis not present

## 2018-01-03 DIAGNOSIS — M79671 Pain in right foot: Secondary | ICD-10-CM | POA: Diagnosis not present

## 2018-01-03 DIAGNOSIS — M216X2 Other acquired deformities of left foot: Secondary | ICD-10-CM

## 2018-01-03 DIAGNOSIS — Q72899 Other reduction defects of unspecified lower limb: Secondary | ICD-10-CM

## 2018-01-03 DIAGNOSIS — R200 Anesthesia of skin: Secondary | ICD-10-CM

## 2018-01-04 ENCOUNTER — Encounter: Payer: Self-pay | Admitting: Sports Medicine

## 2018-01-04 DIAGNOSIS — I10 Essential (primary) hypertension: Secondary | ICD-10-CM | POA: Diagnosis not present

## 2018-01-04 DIAGNOSIS — I509 Heart failure, unspecified: Secondary | ICD-10-CM | POA: Diagnosis not present

## 2018-01-04 DIAGNOSIS — E119 Type 2 diabetes mellitus without complications: Secondary | ICD-10-CM | POA: Diagnosis not present

## 2018-01-04 DIAGNOSIS — I420 Dilated cardiomyopathy: Secondary | ICD-10-CM | POA: Diagnosis not present

## 2018-01-04 DIAGNOSIS — E785 Hyperlipidemia, unspecified: Secondary | ICD-10-CM | POA: Diagnosis not present

## 2018-01-04 NOTE — Progress Notes (Signed)
Subjective: [name] is a 57 y.o. female patient who presents to office for evaluation of Right and left foot pain. Patient complains of continued pain and numbness. Had ABI and NCV studies and is here to discuss the results again and plan of care; states that she has started Rhaffa and it has helped the left. No other issues noted.  Patient Active Problem List   Diagnosis Date Noted  . Diabetes ([place]) 08/09/2016  . Left acoustic neuroma ([place]) 10/03/2015  . Chronic systolic dysfunction of left ventricle 11/24/2011  . ACS (acute coronary syndrome) ([place]) 11/23/2011  . CHF, acute ([place]) 11/23/2011  . Cocaine abuse ([place]) 11/23/2011  . Tobacco abuse 11/23/2011  . Pneumonitis, aspiration ([place]) 11/23/2011  . Cardiomyopathy 11/23/2011  . HTN (hypertension), malignant 11/23/2011    Current Outpatient Medications on File Prior to Visit  Medication Sig Dispense Refill  . aspirin 81 MG EC tablet Take 81 mg by mouth daily. Swallow whole.    Marland Kitchen atorvastatin (LIPITOR) 40 MG tablet     . baclofen (LIORESAL) 10 MG tablet Take 10 mg by mouth 2 (two) times daily.     . carvedilol (COREG) 25 MG tablet Take 25 mg by mouth 2 (two) times daily with a meal.     . Cholecalciferol (VITAMIN D3) 2000 units TABS Take 2,000 Units/day by mouth every morning.    . folic acid (FOLVITE) 1 MG tablet Take 1 mg by mouth daily.    . furosemide (LASIX) 40 MG tablet Take 1 tablet (40 mg total) by mouth daily. 30 tablet 0  . gabapentin (NEURONTIN) 600 MG tablet Take 600 mg by mouth at bedtime.    Marland Kitchen lisinopril (PRINIVIL,ZESTRIL) 20 MG tablet Take 20 mg by mouth daily.    . Magnesium Oxide (MAG-200 PO) Take 400 mg by mouth at bedtime.     . meloxicam (MOBIC) 15 MG tablet Take 15 mg by mouth daily as needed. Reported on 03/15/2016    . metFORMIN (GLUCOPHAGE-XR) 500 MG 24 hr tablet Take 500 mg by mouth daily with breakfast.     . methocarbamol (ROBAXIN) 500 MG tablet Take 500 mg by mouth 2 (two) times daily as needed for  muscle spasms.    . Nutritional Supplements (L-GLUTAMINE/CHOLINE/INOSITOL) 500-400-50 MG TABS Take 500 mg by mouth 2 (two) times daily. Taking 500 mg/500 mg bid    . omeprazole (PRILOSEC) 40 MG capsule Take 40 mg by mouth daily.    . Potassium 99 MG TABS Take 99 mg by mouth daily.    Marland Kitchen pyridOXINE (VITAMIN B-6) 100 MG tablet Take 100 mg by mouth daily.    Marland Kitchen spironolactone (ALDACTONE) 25 MG tablet Take 25 mg by mouth 2 (two) times daily.     No current facility-administered medications on file prior to visit.     Allergies  Allergen Reactions  . Codeine Nausea And Vomiting    Objective:  General: Alert and oriented x3 in no acute distress  Dermatology: No open lesions bilateral lower extremities, no webspace macerations, no ecchymosis bilateral, all nails x 10 are well manicured. + Callus minimal sub met 2-3 on left.   Vascular: Dorsalis Pedis and Posterior Tibial pedal pulses palpable, Capillary Fill Time 3 seconds,(+) pedal hair growth bilateral, no edema bilateral lower extremities, Temperature gradient within normal limits.  Neurology: Johney Maine sensation intact via light touch bilateral  Musculoskeletal: Mild tenderness to right bunion grade 4 with residual deformity on left bunion, history of previous bunionectomy. Pain to short 4th toe bilateral  and to ball of left foot with prominent 2-3 metatarsal heads.   Gait: Antalgic gait  ABIs- Normal  NCV- Normal no large fiber involvement   Assessment and Plan: Problem List Items Addressed This Visit    None    Visit Diagnoses    Bunion    -  Primary   Brachymetatarsia       Foot pain, bilateral       Prominent metatarsal head, left       Numbness           -Complete examination performed -ABIs and NCV reviewed again with patient  -Discussed treatement options -Recommend continue with cushion, padding, wider shoes, topical creams and rubs and Rhaffa treatments; Advised patient to think about surgery. Bunionectomy on with  midfoot exostectomy on right, removal of 4th toe since she does not want bone lenghtened, and met head resection with akin on left; Patient wants to think about these options; 3 month at-least recovery  -Encouraged smoking cessation -Encouraged good supportive shoes and to continue with supportive therapy for feet -Patient to return to office for surgical consult when ready or sooner if condition worsens.  Landis Martins, DPM

## 2018-01-05 DIAGNOSIS — M79642 Pain in left hand: Secondary | ICD-10-CM | POA: Diagnosis not present

## 2018-01-05 DIAGNOSIS — M25562 Pain in left knee: Secondary | ICD-10-CM | POA: Diagnosis not present

## 2018-02-02 ENCOUNTER — Other Ambulatory Visit: Payer: Self-pay | Admitting: Radiation Therapy

## 2018-02-02 DIAGNOSIS — D333 Benign neoplasm of cranial nerves: Secondary | ICD-10-CM

## 2018-02-13 DIAGNOSIS — E1122 Type 2 diabetes mellitus with diabetic chronic kidney disease: Secondary | ICD-10-CM | POA: Diagnosis not present

## 2018-02-13 DIAGNOSIS — I130 Hypertensive heart and chronic kidney disease with heart failure and stage 1 through stage 4 chronic kidney disease, or unspecified chronic kidney disease: Secondary | ICD-10-CM | POA: Diagnosis not present

## 2018-02-13 DIAGNOSIS — N182 Chronic kidney disease, stage 2 (mild): Secondary | ICD-10-CM | POA: Diagnosis not present

## 2018-02-13 DIAGNOSIS — N08 Glomerular disorders in diseases classified elsewhere: Secondary | ICD-10-CM | POA: Diagnosis not present

## 2018-03-08 ENCOUNTER — Other Ambulatory Visit: Payer: Medicare Other

## 2018-03-08 ENCOUNTER — Other Ambulatory Visit: Payer: Self-pay | Admitting: Radiation Oncology

## 2018-03-08 MED ORDER — LORAZEPAM 0.5 MG PO TABS: ORAL_TABLET | ORAL | 0 refills | Status: DC

## 2018-03-14 ENCOUNTER — Ambulatory Visit: Payer: Self-pay | Admitting: Radiation Oncology

## 2018-03-15 ENCOUNTER — Ambulatory Visit
Admission: RE | Admit: 2018-03-15 | Discharge: 2018-03-15 | Disposition: A | Discharge status: Home / Self Care | Payer: Medicare Other | Source: Ambulatory Visit | Attending: Radiation Oncology | Admitting: Radiation Oncology

## 2018-03-15 DIAGNOSIS — D333 Benign neoplasm of cranial nerves: Secondary | ICD-10-CM

## 2018-03-15 MED ORDER — GADOBENATE DIMEGLUMINE 529 MG/ML IV SOLN
10.0000 mL | Freq: Once | INTRAVENOUS | Status: AC | PRN
  Administered 2018-03-15: 10 mL via INTRAVENOUS

## 2018-03-20 ENCOUNTER — Encounter: Payer: Self-pay | Admitting: Radiation Oncology

## 2018-03-20 ENCOUNTER — Other Ambulatory Visit: Payer: Self-pay

## 2018-03-20 ENCOUNTER — Ambulatory Visit
Admission: RE | Admit: 2018-03-20 | Discharge: 2018-03-20 | Disposition: A | Discharge status: Home / Self Care | Payer: Medicare Other | Source: Ambulatory Visit | Attending: Radiation Oncology | Admitting: Radiation Oncology

## 2018-03-20 VITALS — BP 95/67 | HR 59 | Temp 97.7°F | Resp 16 | Ht 65.0 in | Wt 128.6 lb

## 2018-03-20 DIAGNOSIS — I509 Heart failure, unspecified: Secondary | ICD-10-CM | POA: Diagnosis not present

## 2018-03-20 DIAGNOSIS — E7800 Pure hypercholesterolemia, unspecified: Secondary | ICD-10-CM | POA: Insufficient documentation

## 2018-03-20 DIAGNOSIS — K219 Gastro-esophageal reflux disease without esophagitis: Secondary | ICD-10-CM | POA: Insufficient documentation

## 2018-03-20 DIAGNOSIS — Z7984 Long term (current) use of oral hypoglycemic drugs: Secondary | ICD-10-CM | POA: Insufficient documentation

## 2018-03-20 DIAGNOSIS — D333 Benign neoplasm of cranial nerves: Secondary | ICD-10-CM | POA: Insufficient documentation

## 2018-03-20 DIAGNOSIS — M129 Arthropathy, unspecified: Secondary | ICD-10-CM | POA: Diagnosis not present

## 2018-03-20 DIAGNOSIS — Z08 Encounter for follow-up examination after completed treatment for malignant neoplasm: Secondary | ICD-10-CM | POA: Diagnosis not present

## 2018-03-20 DIAGNOSIS — I10 Essential (primary) hypertension: Secondary | ICD-10-CM | POA: Diagnosis not present

## 2018-03-20 DIAGNOSIS — Z79899 Other long term (current) drug therapy: Secondary | ICD-10-CM | POA: Diagnosis not present

## 2018-03-20 DIAGNOSIS — Z86018 Personal history of other benign neoplasm: Secondary | ICD-10-CM | POA: Diagnosis not present

## 2018-03-20 DIAGNOSIS — I429 Cardiomyopathy, unspecified: Secondary | ICD-10-CM | POA: Insufficient documentation

## 2018-03-20 DIAGNOSIS — Z7982 Long term (current) use of aspirin: Secondary | ICD-10-CM | POA: Diagnosis not present

## 2018-03-20 DIAGNOSIS — E119 Type 2 diabetes mellitus without complications: Secondary | ICD-10-CM | POA: Diagnosis not present

## 2018-03-20 DIAGNOSIS — Z87891 Personal history of nicotine dependence: Secondary | ICD-10-CM | POA: Insufficient documentation

## 2018-03-20 NOTE — Progress Notes (Addendum)
[name] 57 y.o. woman with Acoustic Neuroma, review 03-15-18 MRI brain w wo contrast,one year FU.    Seizures:  No  Headaches:Minor headaches occasionally  Nausea: No  Dizziness/ataxia: A couple of times the past year.  Difficulty with hand coordination: None    Focal numbness/weakness: No  Visual deficits/changes: Blurred vision all the time wears glasses.  Confusion/Memory deficits: No change in her memory.  Denies fatigue and pain. Wt Readings from Last 3 Encounters:  03/20/18 128 lb 9.6 oz (58.3 kg)  03/14/17 128 lb 3.2 oz (58.2 kg)  05/11/16 136 lb (61.7 kg)  BP 95/67 (BP Location: Right Arm, Patient Position: Sitting, Cuff Size: Normal)   Pulse (!) 59   Temp 97.7 F (36.5 C) (Oral)   Resp 16   Ht 5\' 5"  (1.651 m)   Wt 128 lb 9.6 oz (58.3 kg)   SpO2 100%   BMI 21.40 kg/m

## 2018-03-20 NOTE — Addendum Note (Signed)
Encounter addended by: Malena Edman, RN on: 03/20/2018 10:59 AM  Actions taken: Charge Capture section accepted

## 2018-03-20 NOTE — Progress Notes (Signed)
Radiation Oncology         ([phone] ________________________________  Name: [name] MRN: [id number]  Date: 03/20/2018  DOB: [birth year]  Follow-Up Visit Note  CC: Cassie Chard, MD  Cassie Chard, MD  Diagnosis:  Left  Acoustic Neuroma  Narrative:  [name] is a pleasant 58 y.o. woman with a history of a left acoustic neuroma. She was originally found to have this process after undergoing an unrelated surgery for her left foot in 2012, and had trouble with complications following this surgery which prompted an evaluation with neurology. It is unclear what prompted a brain scan, however this was ordered and revealed a 4 x 5 mm enhancing mass in the left internal auditory canal. She was offered radiosurgery, though has elected to forgo any treatment. She's been followed with stability of this on serial imaging. She had an MRI of the brain on  03/15/18 which again showed stability of this lesion. She comes today to discuss these findings.  On review of systems, the patient reports that she is doing well overall. She denies any regular episodes of headaches, and only notes some with stress or sinus congestion. She denies any changes in her hearing or vision, and denies any chest pain, shortness of breath, cough, fevers, chills, night sweats, unintended weight changes. She denies any bowel or bladder disturbances, and denies abdominal pain, nausea or vomiting. She denies any new musculoskeletal or joint aches or pains, new skin lesions or concerns. A complete review of systems is obtained and is otherwise negative.  Prior Radiation Treatments: None  Past Medical History:  Past Medical History:  Diagnosis Date  . Arthritis   . Bowel obstruction ([place])   . Bronchitis   . CHF (congestive heart failure) ([place])   . Chronic systolic dysfunction of left ventricle   . Diabetes mellitus without complication ([facility])    on oral meds  . GERD (gastroesophageal reflux disease)   .  Headache(784.0)    migraines, hasn't had one for over a year  . History of noncompliance with medical treatment   . Hypercholesteremia   . Hypertension   . LVH (left ventricular hypertrophy)   . Nonischemic cardiomyopathy ([place])   . Polysubstance abuse ([place])   . Tobacco abuse disorder     Past Surgical History: Past Surgical History:  Procedure Laterality Date  . ABDOMINAL HYSTERECTOMY    . ABDOMINAL SURGERY    . left foot surgery    . STRABISMUS SURGERY Left 07/31/2013   Procedure: REPAIR STRABISMUS;  Surgeon: Derry Skill, MD;  Location: [facility];  Service: Ophthalmology;  Laterality: Left;  EYE MUSCLE SURGERY LEFT EYE  . UTERINE FIBROID SURGERY      Social History:  Social History   Socioeconomic History  . Marital status: Single    Spouse name: Not on file  . Number of children: 0  . Years of education: 78  . Highest education level: Not on file  Occupational History  . Occupation: N/A  Social Needs  . Financial resource strain: Not on file  . Food insecurity:    Worry: Not on file    Inability: Not on file  . Transportation needs:    Medical: Not on file    Non-medical: Not on file  Tobacco Use  . Smoking status: Former Smoker    Packs/day: 0.50    Years: 30.00    Pack years: 15.00    Last attempt to quit: 07/02/2012    Years since quitting: 5.7  .  Smokeless tobacco: Never Used  . Tobacco comment: she is not ready to quit  Substance and Sexual Activity  . Alcohol use: Yes    Alcohol/week: 0.0 oz    Comment: only one drink in past 2 months, States rarely drinks now. Used to be a daily drinker  . Drug use: Yes  . Sexual activity: Not on file  Lifestyle  . Physical activity:    Days per week: Not on file    Minutes per session: Not on file  . Stress: Not on file  Relationships  . Social connections:    Talks on phone: Not on file    Gets together: Not on file    Attends religious service: Not on file    Active member of club or organization: Not on file     Attends meetings of clubs or organizations: Not on file    Relationship status: Not on file  . Intimate partner violence:    Fear of current or ex partner: Not on file    Emotionally abused: Not on file    Physically abused: Not on file    Forced sexual activity: Not on file  Other Topics Concern  . Not on file  Social History Narrative   Lives in [place]   Disabled   Occasional caffeine use     Family History: Family History  Problem Relation Age of Onset  . Diabetes Mother   . Hypertension Mother   . Heart disease Mother   . Heart disease Father   . Diabetes Father   . Hypertension Father      ALLERGIES:  is allergic to codeine.  Meds: Current Outpatient Medications  Medication Sig Dispense Refill  . aspirin 81 MG EC tablet Take 81 mg by mouth daily. Swallow whole.    Marland Kitchen atorvastatin (LIPITOR) 40 MG tablet     . baclofen (LIORESAL) 10 MG tablet Take 10 mg by mouth 2 (two) times daily.     . carvedilol (COREG) 25 MG tablet Take 25 mg by mouth 2 (two) times daily with a meal.     . Cholecalciferol (VITAMIN D3) 2000 units TABS Take 2,000 Units/day by mouth every morning.    . folic acid (FOLVITE) 1 MG tablet Take 1 mg by mouth daily.    . furosemide (LASIX) 40 MG tablet Take 1 tablet (40 mg total) by mouth daily. 30 tablet 0  . gabapentin (NEURONTIN) 600 MG tablet Take 600 mg by mouth at bedtime.    Marland Kitchen lisinopril (PRINIVIL,ZESTRIL) 20 MG tablet Take 20 mg by mouth daily.    Marland Kitchen LORazepam (ATIVAN) 0.5 MG tablet Take one tab po 30 minutes prior to MRI 5 tablet 0  . Magnesium Oxide (MAG-200 PO) Take 400 mg by mouth at bedtime.     . meloxicam (MOBIC) 15 MG tablet Take 15 mg by mouth daily as needed. Reported on 03/15/2016    . metFORMIN (GLUCOPHAGE-XR) 500 MG 24 hr tablet Take 500 mg by mouth daily with breakfast.     . methocarbamol (ROBAXIN) 500 MG tablet Take 500 mg by mouth 2 (two) times daily as needed for muscle spasms.    . Nutritional Supplements  (L-GLUTAMINE/CHOLINE/INOSITOL) 500-400-50 MG TABS Take 500 mg by mouth 2 (two) times daily. Taking 500 mg/500 mg bid    . omeprazole (PRILOSEC) 40 MG capsule Take 40 mg by mouth daily.    . Potassium 99 MG TABS Take 99 mg by mouth daily.    Marland Kitchen pyridOXINE (VITAMIN B-6)  100 MG tablet Take 100 mg by mouth daily.    Marland Kitchen spironolactone (ALDACTONE) 25 MG tablet Take 25 mg by mouth 2 (two) times daily.     No current facility-administered medications for this encounter.     Physical Findings: Wt Readings from Last 3 Encounters:  03/20/18 128 lb 9.6 oz (58.3 kg)  03/14/17 128 lb 3.2 oz (58.2 kg)  05/11/16 136 lb (61.7 kg)   Temp Readings from Last 3 Encounters:  03/20/18 97.7 F (36.5 C) (Oral)  03/14/17 98.2 F (36.8 C) (Oral)  03/15/16 98.2 F (36.8 C) (Oral)   BP Readings from Last 3 Encounters:  03/20/18 95/67  03/14/17 116/85  05/11/16 112/82   Pulse Readings from Last 3 Encounters:  03/20/18 (!) 59  03/14/17 69  05/11/16 80   Pain Assessment Pain Score: 0-No pain/10 In general this is a well appearing African American female in no acute distress. She's alert and oriented x4 and appropriate throughout the examination. Cardiopulmonary assessment is negative for acute distress and she exhibits normal effort.   Lab Findings: Lab Results  Component Value Date   WBC 7.7 09/22/2015   HGB 12.4 09/22/2015   HCT 38.1 09/22/2015   MCV 89.0 09/22/2015   PLT 201 09/22/2015     Radiographic Findings: Mr Jeri Cos GH Contrast  Result Date: 03/15/2018 CLINICAL DATA:  SRS protocol. Follow-up LEFT IAC vestibular schwannoma. EXAM: MRI HEAD WITHOUT AND WITH CONTRAST TECHNIQUE: Multiplanar, multiecho pulse sequences of the brain and surrounding structures were obtained without and with intravenous contrast. CONTRAST:  69mL MULTIHANCE GADOBENATE DIMEGLUMINE 529 MG/ML IV SOLN COMPARISON:  MRI of the head Mar 10, 2016. FINDINGS: INTERNAL AUDITORY CANALS: No cerebellar pontine angle masses.  Similar 5 x 3 mm LEFT internal auditory canal homogeneously enhancing mass which posterior displaces the traversing cranial nerves. No extension to the porous acusticus. Symmetric, normal internal auditory canals. Normal appearance of the inner ear structures. INTRACRANIAL CONTENTS: No reduced diffusion to suggest acute ischemia or hypercellular tumor. The ventricles and sulci are normal for patient's age. No suspicious parenchymal signal, mass lesions, mass effect. Similar scattered subcentimeter supratentorial white matter FLAIR T2 hyperintensities. No abnormal intraparenchymal or extra-axial enhancement. No abnormal extra-axial fluid collections. No extra-axial masses. VASCULAR: Normal major intracranial vascular flow voids present at skull base. SKULL AND UPPER CERVICAL SPINE: No abnormal sellar expansion. No suspicious calvarial bone marrow signal. Craniocervical junction maintained. SINUSES/ ORBITS: The mastoid air-cells and included paranasal sinuses are well-aerated.The included ocular globes and orbital contents are non-suspicious. OTHER: None. IMPRESSION: 1. 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 2. Stable mild chronic small vessel ischemic changes. Electronically Signed   By: Elon Alas M.D.   On: 03/15/2018 14:24    Impression/Plan: 1. Left Acoustic Neuroma. The patient appears to be doing well clinically and radiographically. Her case was discussed in brain oncology conference this morning, and recommendations were made to continue with annual surveillance MRIs. She continues to follow with Dr. Redmond Baseman annually and requests that we discontinue radiographic studies unless she becomes symptomatic. I discussed that I felt that this would be reasonable, and she understands the risk of not proceeding radiographically at annual intervals if this were to grow and cause neurologic/audiologic symptoms. She is also in agreement to continue annual audiology and ENT visits.    Carola Rhine,  PAC

## 2018-03-23 DIAGNOSIS — K219 Gastro-esophageal reflux disease without esophagitis: Secondary | ICD-10-CM | POA: Diagnosis not present

## 2018-04-05 ENCOUNTER — Encounter: Payer: Self-pay | Admitting: Radiation Therapy

## 2018-04-05 DIAGNOSIS — I5020 Unspecified systolic (congestive) heart failure: Secondary | ICD-10-CM | POA: Diagnosis not present

## 2018-04-05 DIAGNOSIS — E119 Type 2 diabetes mellitus without complications: Secondary | ICD-10-CM | POA: Diagnosis not present

## 2018-04-05 DIAGNOSIS — I10 Essential (primary) hypertension: Secondary | ICD-10-CM | POA: Diagnosis not present

## 2018-04-05 DIAGNOSIS — R0789 Other chest pain: Secondary | ICD-10-CM | POA: Diagnosis not present

## 2018-04-05 DIAGNOSIS — I420 Dilated cardiomyopathy: Secondary | ICD-10-CM | POA: Diagnosis not present

## 2018-04-05 NOTE — Progress Notes (Signed)
No future appointments scheduled at this time for [name] in [facility]. Per her request, she would like to stop doing MRI scans unless there is a clinical change. She will be followed closely by ENT, Dr. Melida Quitter for annual visits and hearing tests. She knows that we are happy to see her again if the need arises. All she needs to do is give [place] a call. [place] is very happy with this plan.   Mont Dutton R.T.(R)(T) Special Procedures Navigator  Radiation Oncology

## 2018-04-13 DIAGNOSIS — M25562 Pain in left knee: Secondary | ICD-10-CM | POA: Diagnosis not present

## 2018-06-28 DIAGNOSIS — Z0000 Encounter for general adult medical examination without abnormal findings: Secondary | ICD-10-CM | POA: Insufficient documentation

## 2018-06-28 DIAGNOSIS — E559 Vitamin D deficiency, unspecified: Secondary | ICD-10-CM | POA: Insufficient documentation

## 2018-06-28 DIAGNOSIS — M545 Low back pain, unspecified: Secondary | ICD-10-CM | POA: Insufficient documentation

## 2018-06-28 DIAGNOSIS — N189 Chronic kidney disease, unspecified: Secondary | ICD-10-CM | POA: Insufficient documentation

## 2018-06-28 DIAGNOSIS — N08 Glomerular disorders in diseases classified elsewhere: Secondary | ICD-10-CM | POA: Insufficient documentation

## 2018-06-28 DIAGNOSIS — H04129 Dry eye syndrome of unspecified lacrimal gland: Secondary | ICD-10-CM | POA: Insufficient documentation

## 2018-06-28 DIAGNOSIS — I5022 Chronic systolic (congestive) heart failure: Secondary | ICD-10-CM | POA: Insufficient documentation

## 2018-06-28 DIAGNOSIS — I1310 Hypertensive heart and chronic kidney disease without heart failure, with stage 1 through stage 4 chronic kidney disease, or unspecified chronic kidney disease: Secondary | ICD-10-CM | POA: Insufficient documentation

## 2018-06-28 LAB — VITAMIN D 25 HYDROXY (VIT D DEFICIENCY, FRACTURES): VIT D 25 HYDROXY: 49.8

## 2018-06-28 LAB — CBC AND DIFFERENTIAL
HCT: 32 — AB (ref 36–46)
Hemoglobin: 10.6 — AB (ref 12.0–16.0)
WBC: 8.9

## 2018-06-28 LAB — HEPATIC FUNCTION PANEL
ALT: 17 (ref 7–35)
AST: 19 (ref 13–35)
Alkaline Phosphatase: 60 (ref 25–125)
Bilirubin, Total: 0.2

## 2018-06-28 LAB — BASIC METABOLIC PANEL
BUN: 16 (ref 4–21)
CREATININE: 0.9 (ref 0.5–1.1)
GLUCOSE: 81
POTASSIUM: 3.9 (ref 3.4–5.3)
Sodium: 140 (ref 137–147)

## 2018-06-28 LAB — HEMOGLOBIN A1C: Hemoglobin A1C: 5.8

## 2018-07-15 ENCOUNTER — Encounter: Payer: Self-pay | Admitting: Internal Medicine

## 2018-07-15 DIAGNOSIS — M545 Low back pain: Secondary | ICD-10-CM

## 2018-07-15 DIAGNOSIS — E559 Vitamin D deficiency, unspecified: Secondary | ICD-10-CM

## 2018-07-15 DIAGNOSIS — N08 Glomerular disorders in diseases classified elsewhere: Secondary | ICD-10-CM

## 2018-07-15 DIAGNOSIS — H04129 Dry eye syndrome of unspecified lacrimal gland: Secondary | ICD-10-CM

## 2018-07-15 DIAGNOSIS — I5022 Chronic systolic (congestive) heart failure: Secondary | ICD-10-CM

## 2018-07-15 DIAGNOSIS — Z0000 Encounter for general adult medical examination without abnormal findings: Secondary | ICD-10-CM

## 2018-07-15 DIAGNOSIS — I130 Hypertensive heart and chronic kidney disease with heart failure and stage 1 through stage 4 chronic kidney disease, or unspecified chronic kidney disease: Secondary | ICD-10-CM

## 2018-07-15 DIAGNOSIS — N181 Chronic kidney disease, stage 1: Secondary | ICD-10-CM

## 2018-07-28 DIAGNOSIS — Z23 Encounter for immunization: Secondary | ICD-10-CM | POA: Diagnosis not present

## 2018-08-15 ENCOUNTER — Other Ambulatory Visit: Payer: Self-pay

## 2018-08-15 ENCOUNTER — Ambulatory Visit: Payer: Medicare Other | Admitting: Sports Medicine

## 2018-08-15 ENCOUNTER — Encounter: Payer: Self-pay | Admitting: Sports Medicine

## 2018-08-15 DIAGNOSIS — M21619 Bunion of unspecified foot: Secondary | ICD-10-CM

## 2018-08-15 DIAGNOSIS — Q72899 Other reduction defects of unspecified lower limb: Secondary | ICD-10-CM

## 2018-08-15 DIAGNOSIS — L84 Corns and callosities: Secondary | ICD-10-CM

## 2018-08-15 DIAGNOSIS — M79672 Pain in left foot: Secondary | ICD-10-CM

## 2018-08-15 DIAGNOSIS — R200 Anesthesia of skin: Secondary | ICD-10-CM

## 2018-08-15 DIAGNOSIS — M216X2 Other acquired deformities of left foot: Secondary | ICD-10-CM

## 2018-08-15 NOTE — Progress Notes (Signed)
Subjective: [name] is a 56 y.o. female patient who presents to office for evaluation of left foot pain and states that she is not yet ready for surgery however does want me to trim her callus that is painful on the bottom of the left foot states that she tried at one point to trim it herself but got too close and cause increased pain and reports that she has been having her massage therapist work on her foot which seems to be helping and has been giving her some relief however has pain to callus area and would like for me to trim at this visit.  Patient denies any changes with medical history or medical problems since last encounter.  No other issues noted.  Patient Active Problem List   Diagnosis Date Noted  . CKD (chronic kidney disease) 06/28/2018  . Chronic systolic CHF (congestive heart failure) ([place]) 06/28/2018  . 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 Nephropathy associated with another disease 06/28/2018  . Lumbago 06/28/2018  . Vitamin D deficiency, unspecified 06/28/2018  . Routine general medical examination at a health care facility 06/28/2018  . Dry eye syndrome 06/28/2018  . Imbalance 10/04/2017  . Diabetes ([place]) 08/09/2016  . Left acoustic neuroma ([place]) 10/03/2015  . Essential hypertension 03/29/2012  . Chronic systolic dysfunction of left ventricle 11/24/2011  . ACS (acute coronary syndrome) ([place]) 11/23/2011  . CHF, acute ([place]) 11/23/2011  . Cocaine abuse ([place]) 11/23/2011  . Tobacco abuse 11/23/2011  . Pneumonitis, aspiration ([place]) 11/23/2011  . Cardiomyopathy 11/23/2011  . HTN (hypertension), malignant 11/23/2011    Current Outpatient Medications on File Prior to Visit  Medication Sig Dispense Refill  . aspirin 81 MG EC tablet Take 81 mg by mouth daily. Swallow whole.    Marland Kitchen atorvastatin (LIPITOR) 40 MG tablet     . baclofen (LIORESAL) 10 MG tablet Take 10 mg by mouth 2 (two) times daily.     . carvedilol (COREG) 25 MG  tablet Take 25 mg by mouth 2 (two) times daily with a meal.     . Cholecalciferol (VITAMIN D3) 2000 units TABS Take 2,000 Units/day by mouth every morning.    . folic acid (FOLVITE) 1 MG tablet Take 1 mg by mouth daily.    . furosemide (LASIX) 40 MG tablet Take 1 tablet (40 mg total) by mouth daily. 30 tablet 0  . gabapentin (NEURONTIN) 600 MG tablet Take 600 mg by mouth at bedtime.    Marland Kitchen lisinopril (PRINIVIL,ZESTRIL) 20 MG tablet Take 20 mg by mouth daily.    Marland Kitchen LORazepam (ATIVAN) 0.5 MG tablet Take one tab po 30 minutes prior to MRI 5 tablet 0  . Magnesium Oxide (MAG-200 PO) Take 400 mg by mouth at bedtime.     . meloxicam (MOBIC) 15 MG tablet Take 15 mg by mouth daily as needed. Reported on 03/15/2016    . metFORMIN (GLUCOPHAGE-XR) 500 MG 24 hr tablet Take 500 mg by mouth daily with breakfast.     . methocarbamol (ROBAXIN) 500 MG tablet Take 500 mg by mouth 2 (two) times daily as needed for muscle spasms.    . Nutritional Supplements (L-GLUTAMINE/CHOLINE/INOSITOL) 500-400-50 MG TABS Take 500 mg by mouth 2 (two) times daily. Taking 500 mg/500 mg bid    . omeprazole (PRILOSEC) 40 MG capsule Take 40 mg by mouth daily.    . Potassium 99 MG TABS Take 99 mg by mouth daily.    Marland Kitchen pyridOXINE (VITAMIN B-6) 100  MG tablet Take 100 mg by mouth daily.    Marland Kitchen spironolactone (ALDACTONE) 25 MG tablet Take 25 mg by mouth 2 (two) times daily.     No current facility-administered medications on file prior to visit.     Allergies  Allergen Reactions  . Codeine Nausea And Vomiting    Objective:  General: Alert and oriented x3 in no acute distress  Dermatology: No open lesions bilateral lower extremities, no webspace macerations, no ecchymosis bilateral, all nails x 10 are well manicured. + Callus minimal sub met 3 on left.   Vascular: Dorsalis Pedis and Posterior Tibial pedal pulses palpable, Capillary Fill Time 3 seconds,(+) pedal hair growth bilateral, no edema bilateral lower extremities, Temperature  gradient within normal limits.  Neurology: Johney Maine sensation intact via light touch bilateral  Musculoskeletal: Mild tenderness to right bunion grade 4 with residual deformity on left bunion, history of previous bunionectomy. Pain to short 4th toe bilateral and to ball of left foot with prominent 2-3 metatarsal heads.   Gait: Antalgic gait  Assessment and Plan: Problem List Items Addressed This Visit    None    Visit Diagnoses    Callus of foot    -  Primary   Bunion       Brachymetatarsia       Prominent metatarsal head, left       Numbness       Left foot pain           -Complete examination performed -Discussed treatement options -Mechanically debrided callus on the bottom of the left foot and applied offloading padding to insole and advised patient to continue with his daily and if padding wears out on her insoles to replace with padding as I provided this visit -Patient may continue with massage therapy -Encouraged patient if fails to improve may benefit from surgery however must still continue with trying to quit smoking -Continue with good supportive shoes daily and orthotics with padding as I added this visit -Patient to return to office as needed or sooner if condition worsens.  Landis Martins, DPM

## 2018-08-21 ENCOUNTER — Other Ambulatory Visit: Payer: Self-pay

## 2018-08-21 MED ORDER — FOLIC ACID 1 MG PO TABS: 1.0000 mg | ORAL_TABLET | Freq: Every day | ORAL | 1 refills | Status: AC

## 2018-09-14 ENCOUNTER — Other Ambulatory Visit: Payer: Self-pay | Admitting: Internal Medicine

## 2018-09-14 DIAGNOSIS — Z1231 Encounter for screening mammogram for malignant neoplasm of breast: Secondary | ICD-10-CM

## 2018-10-06 ENCOUNTER — Ambulatory Visit
Admission: RE | Admit: 2018-10-06 | Discharge: 2018-10-06 | Disposition: A | Discharge status: Home / Self Care | Payer: Medicare Other | Source: Ambulatory Visit | Attending: Internal Medicine | Admitting: Internal Medicine

## 2018-10-06 DIAGNOSIS — Z1231 Encounter for screening mammogram for malignant neoplasm of breast: Secondary | ICD-10-CM

## 2018-10-16 ENCOUNTER — Ambulatory Visit (INDEPENDENT_AMBULATORY_CARE_PROVIDER_SITE_OTHER): Payer: Medicare Other | Admitting: Internal Medicine

## 2018-10-16 ENCOUNTER — Other Ambulatory Visit: Payer: Self-pay

## 2018-10-16 ENCOUNTER — Encounter: Payer: Self-pay | Admitting: Internal Medicine

## 2018-10-16 VITALS — BP 110/68 | HR 65 | Temp 97.7°F | Ht 63.0 in | Wt 131.8 lb

## 2018-10-16 DIAGNOSIS — I519 Heart disease, unspecified: Secondary | ICD-10-CM | POA: Diagnosis not present

## 2018-10-16 DIAGNOSIS — N182 Chronic kidney disease, stage 2 (mild): Secondary | ICD-10-CM | POA: Diagnosis not present

## 2018-10-16 DIAGNOSIS — E1122 Type 2 diabetes mellitus with diabetic chronic kidney disease: Secondary | ICD-10-CM

## 2018-10-16 DIAGNOSIS — D649 Anemia, unspecified: Secondary | ICD-10-CM

## 2018-10-16 DIAGNOSIS — I130 Hypertensive heart and chronic kidney disease with heart failure and stage 1 through stage 4 chronic kidney disease, or unspecified chronic kidney disease: Secondary | ICD-10-CM

## 2018-10-16 MED ORDER — CARVEDILOL 25 MG PO TABS: 25.0000 mg | ORAL_TABLET | Freq: Two times a day (BID) | ORAL | 2 refills | Status: AC

## 2018-10-16 MED ORDER — DAPAGLIFLOZIN PROPANEDIOL 5 MG PO TABS: 5.0000 mg | ORAL_TABLET | Freq: Every day | ORAL | 0 refills | Status: DC

## 2018-10-16 MED ORDER — MIRTAZAPINE 15 MG PO TABS: 15.0000 mg | ORAL_TABLET | Freq: Every day | ORAL | 2 refills | Status: DC

## 2018-10-16 MED ORDER — CARVEDILOL 25 MG PO TABS: 25.0000 mg | ORAL_TABLET | Freq: Two times a day (BID) | ORAL | 0 refills | Status: DC

## 2018-10-16 NOTE — Patient Instructions (Signed)

## 2018-10-17 LAB — CMP14+EGFR
ALT: 22 IU/L (ref 0–32)
AST: 29 IU/L (ref 0–40)
Albumin/Globulin Ratio: 1.9 (ref 1.2–2.2)
Albumin: 4.6 g/dL (ref 3.5–5.5)
Alkaline Phosphatase: 69 IU/L (ref 39–117)
BILIRUBIN TOTAL: 0.2 mg/dL (ref 0.0–1.2)
BUN/Creatinine Ratio: 19 (ref 9–23)
BUN: 21 mg/dL (ref 6–24)
CHLORIDE: 105 mmol/L (ref 96–106)
CO2: 23 mmol/L (ref 20–29)
Calcium: 10.2 mg/dL (ref 8.7–10.2)
Creatinine, Ser: 1.13 mg/dL — ABNORMAL HIGH (ref 0.57–1.00)
GFR calc Af Amer: 61 mL/min/{1.73_m2} (ref >59)
GFR calc non Af Amer: 53 mL/min/{1.73_m2} — ABNORMAL LOW (ref >59)
Globulin, Total: 2.4 g/dL (ref 1.5–4.5)
Glucose: 88 mg/dL (ref 65–99)
Potassium: 4.7 mmol/L (ref 3.5–5.2)
Sodium: 144 mmol/L (ref 134–144)
Total Protein: 7 g/dL (ref 6.0–8.5)

## 2018-10-17 LAB — LIPID PANEL
CHOLESTEROL TOTAL: 161 mg/dL (ref 100–199)
Chol/HDL Ratio: 2.7 ratio (ref 0.0–4.4)
HDL: 59 mg/dL (ref >39)
LDL CALC: 75 mg/dL (ref 0–99)
TRIGLYCERIDES: 134 mg/dL (ref 0–149)
VLDL CHOLESTEROL CAL: 27 mg/dL (ref 5–40)

## 2018-10-17 LAB — CBC
Hematocrit: 33.7 % — ABNORMAL LOW (ref 34.0–46.6)
Hemoglobin: 10.8 g/dL — ABNORMAL LOW (ref 11.1–15.9)
MCH: 28.3 pg (ref 26.6–33.0)
MCHC: 32 g/dL (ref 31.5–35.7)
MCV: 88 fL (ref 79–97)
Platelets: 256 10*3/uL (ref 150–450)
RBC: 3.82 x10E6/uL (ref 3.77–5.28)
RDW: 12.4 % (ref 12.3–15.4)
WBC: 8.4 10*3/uL (ref 3.4–10.8)

## 2018-10-17 LAB — VITAMIN B12: Vitamin B-12: 552 pg/mL (ref 232–1245)

## 2018-10-17 LAB — TSH: TSH: 0.607 u[IU]/mL (ref 0.450–4.500)

## 2018-10-17 LAB — HEMOGLOBIN A1C
Est. average glucose Bld gHb Est-mCnc: 120 mg/dL
Hgb A1c MFr Bld: 5.8 % — ABNORMAL HIGH (ref 4.8–5.6)

## 2018-10-17 NOTE — Progress Notes (Signed)
Your chol is great. pls send copy of labs to her cardiologist, Dr. Terrence Dupont.  Your hba1c is 5.8, this is great. Your kidney fxn is stable. Your liver fxn is nl. Your blood cont is sl. Low. I do not think this is causing your cold intolerance. Your thyroid fxn is wnl as well. I will add on additional iron studies.  Kw-pls add on fe/tibc and ferritin. Diagnosis: D64.9.

## 2018-10-23 ENCOUNTER — Telehealth: Payer: Self-pay

## 2018-10-23 NOTE — Telephone Encounter (Signed)
Returned pt call about her requesting her lab results I advised her that we will give [place] a call as soon as we receive them. YRL,RMA

## 2018-11-02 NOTE — Progress Notes (Signed)
Subjective:     Patient ID: [name] , female    DOB: [birth year] , 58 y.o.   MRN: [id number]   Chief Complaint  Patient presents with  . Diabetes  . Hypertension    HPI  Diabetes  She presents for her follow-up diabetic visit. She has type 2 diabetes mellitus. Her disease course has been stable. There are no hypoglycemic associated symptoms. Pertinent negatives for diabetes include no blurred vision and no chest pain. There are no hypoglycemic complications. Diabetic complications include heart disease. Risk factors for coronary artery disease include diabetes mellitus, dyslipidemia, hypertension and post-menopausal.  Hypertension  This is a chronic problem. The current episode started more than 1 year ago. The problem has been gradually improving since onset. The problem is controlled. Pertinent negatives include no blurred vision, chest pain, palpitations or shortness of breath.   She reports compliance with meds.   Past Medical History:  Diagnosis Date  . Arthritis   . Bowel obstruction ([place])   . Bronchitis   . CHF (congestive heart failure) ([place])   . Chronic systolic dysfunction of left ventricle   . Diabetes mellitus without complication (HCC)    on oral meds  . GERD (gastroesophageal reflux disease)   . Headache(784.0)    migraines, hasn't had one for over a year  . History of noncompliance with medical treatment   . Hypercholesteremia   . Hypertension   . LVH (left ventricular hypertrophy)   . Nonischemic cardiomyopathy ([place])   . Polysubstance abuse ([place])   . Tobacco abuse disorder      Family History  Problem Relation Age of Onset  . Diabetes Mother   . Hypertension Mother   . Heart disease Mother   . Heart disease Father   . Diabetes Father   . Hypertension Father   . Breast cancer Neg Hx      Current Outpatient Medications:  .  aspirin 81 MG EC tablet, Take 81 mg by mouth daily. Swallow whole., Disp: , Rfl:  .  atorvastatin (LIPITOR) 40 MG  tablet, , Disp: , Rfl:  .  baclofen (LIORESAL) 10 MG tablet, Take 10 mg by mouth 2 (two) times daily. , Disp: , Rfl:  .  carvedilol (COREG) 25 MG tablet, Take 1 tablet (25 mg total) by mouth 2 (two) times daily with a meal., Disp: 60 tablet, Rfl: 2 .  Cholecalciferol (VITAMIN D3) 2000 units TABS, Take 2,000 Units/day by mouth every morning., Disp: , Rfl:  .  diphenhydrAMINE (BENADRYL) 25 MG tablet, Take by mouth., Disp: , Rfl:  .  folic acid (FOLVITE) 1 MG tablet, Take 1 tablet (1 mg total) by mouth daily., Disp: 90 tablet, Rfl: 1 .  furosemide (LASIX) 40 MG tablet, Take 1 tablet (40 mg total) by mouth daily., Disp: 30 tablet, Rfl: 0 .  lisinopril (PRINIVIL,ZESTRIL) 20 MG tablet, Take 20 mg by mouth daily., Disp: , Rfl:  .  loratadine (CLARITIN) 10 MG tablet, Take by mouth., Disp: , Rfl:  .  magnesium oxide (MAG-OX) 400 (241.3 Mg) MG tablet, Take 1 tablet by mouth daily., Disp: , Rfl: 1 .  metFORMIN (GLUCOPHAGE-XR) 500 MG 24 hr tablet, Take 500 mg by mouth daily with breakfast. , Disp: , Rfl:  .  omeprazole (PRILOSEC) 40 MG capsule, Take 40 mg by mouth daily., Disp: , Rfl:  .  pravastatin (PRAVACHOL) 40 MG tablet, Take by mouth., Disp: , Rfl:  .  pyridOXINE (VITAMIN B-6) 100 MG tablet, Take 100 mg by  mouth daily., Disp: , Rfl:  .  spironolactone (ALDACTONE) 25 MG tablet, Take 25 mg by mouth 2 (two) times daily., Disp: , Rfl:  .  dapagliflozin propanediol (FARXIGA) 5 MG TABS tablet, Take 5 mg by mouth daily., Disp: 30 tablet, Rfl: 0 .  mirtazapine (REMERON) 15 MG tablet, Take 1 tablet (15 mg total) by mouth at bedtime., Disp: 30 tablet, Rfl: 2   Allergies  Allergen Reactions  . Codeine Nausea And Vomiting     Review of Systems  Constitutional: Negative.   Eyes: Negative for blurred vision.  Respiratory: Negative.  Negative for shortness of breath.   Cardiovascular: Negative.  Negative for chest pain and palpitations.  Gastrointestinal: Negative.   Neurological: Negative.    Psychiatric/Behavioral: Negative.      Today's Vitals   10/16/18 1508  BP: 110/68  Pulse: 65  Temp: 97.7 F (36.5 C)  Weight: 131 lb 12.8 oz (59.8 kg)  Height: '5\' 3"'  (1.6 m)  PainSc: 7   PainLoc: Back   Body mass index is 23.35 kg/m.   Objective:  Physical Exam Vitals signs and nursing note reviewed.  Constitutional:      Appearance: Normal appearance.  HENT:     Head: Normocephalic and atraumatic.  Cardiovascular:     Rate and Rhythm: Normal rate and regular rhythm.     Heart sounds: Normal heart sounds.  Pulmonary:     Effort: Pulmonary effort is normal.     Breath sounds: Normal breath sounds.  Skin:    General: Skin is warm.  Neurological:     General: No focal deficit present.     Mental Status: She is alert.  Psychiatric:        Mood and Affect: Mood normal.         Assessment And Plan:     1. Type 2 diabetes mellitus with stage 2 chronic kidney disease, without long-term current use of insulin ([place])  I will check labs as listed below. Importance of regular exercise was discussed with the patient in full detail. She is encouraged to follow dietary recommendations as well. Additionally, we discussed the use of Farxiga in dm management. We also discussed the risk of metformin use in setting of CHF. She will think about medication changes. If she chooses to start [place], I will need to see her in four weeks for re-evaluation.   - Lipid Profile - Hemoglobin A1c - CMP14+EGFR  2. Chronic renal disease, stage II  Chronic. I will check GFR Cr today.   3. Heart & renal disease, hypertensive benign, heart fail/chr renal dis ([place])  Well controlled. She will continue with current meds. She is encouraged to avoid adding salt to her foods.   4. Chronic systolic dysfunction of left ventricle  Chronic, yet stable. She is also followed by cardiology.   5. Anemia, unspecified type  I will check labs as listed below. I will make further recommendations once her  labs are available for review.   - Vitamin B12 - CBC no Diff - TSH        Maximino Greenland, MD

## 2018-11-03 LAB — IRON AND TIBC
IRON: 59 ug/dL (ref 27–159)
Iron Saturation: 18 % (ref 15–55)
TIBC: 330 ug/dL (ref 250–450)
UIBC: 271 ug/dL (ref 131–425)

## 2018-11-03 LAB — FERRITIN: Ferritin: 38 ng/mL (ref 15–150)

## 2018-11-03 LAB — SPECIMEN STATUS REPORT

## 2018-11-29 ENCOUNTER — Ambulatory Visit (INDEPENDENT_AMBULATORY_CARE_PROVIDER_SITE_OTHER): Payer: Medicare Other | Admitting: Internal Medicine

## 2018-11-29 ENCOUNTER — Encounter: Payer: Self-pay | Admitting: Internal Medicine

## 2018-11-29 VITALS — BP 112/70 | HR 72 | Temp 98.2°F | Ht 63.0 in | Wt 132.6 lb

## 2018-11-29 DIAGNOSIS — E1122 Type 2 diabetes mellitus with diabetic chronic kidney disease: Secondary | ICD-10-CM | POA: Diagnosis not present

## 2018-11-29 DIAGNOSIS — Z79899 Other long term (current) drug therapy: Secondary | ICD-10-CM | POA: Diagnosis not present

## 2018-11-29 DIAGNOSIS — R630 Anorexia: Secondary | ICD-10-CM

## 2018-11-29 DIAGNOSIS — N182 Chronic kidney disease, stage 2 (mild): Secondary | ICD-10-CM | POA: Diagnosis not present

## 2018-11-29 MED ORDER — DAPAGLIFLOZIN PROPANEDIOL 5 MG PO TABS: 5.0000 mg | ORAL_TABLET | Freq: Every day | ORAL | 5 refills | Status: DC

## 2018-11-30 ENCOUNTER — Encounter: Payer: Self-pay | Admitting: Internal Medicine

## 2018-11-30 LAB — BMP8+EGFR
BUN/Creatinine Ratio: 17 (ref 9–23)
BUN: 18 mg/dL (ref 6–24)
CO2: 20 mmol/L (ref 20–29)
CREATININE: 1.03 mg/dL — AB (ref 0.57–1.00)
Calcium: 9.7 mg/dL (ref 8.7–10.2)
Chloride: 104 mmol/L (ref 96–106)
GFR calc Af Amer: 69 mL/min/{1.73_m2} (ref >59)
GFR, EST NON AFRICAN AMERICAN: 60 mL/min/{1.73_m2} (ref >59)
Glucose: 101 mg/dL — ABNORMAL HIGH (ref 65–99)
Potassium: 4.3 mmol/L (ref 3.5–5.2)
Sodium: 140 mmol/L (ref 134–144)

## 2018-11-30 NOTE — Progress Notes (Signed)
Subjective:     Patient ID: [name] , female    DOB: 07/18/1961 , 58 y.o.   MRN: [id number]   Chief Complaint  Patient presents with  . [place] f/u    HPI  She is here today for f/u [place]. This was added to her medication regimen at her last visit. She reports that she was having trouble tolerating the metformin.  She has not had any issues since starting the medication.  She states she was recently seen by Dr. Terrence Dupont, her cardiologist and he agreed with this choice of medication.     Past Medical History:  Diagnosis Date  . Arthritis   . Bowel obstruction ([place])   . Bronchitis   . CHF (congestive heart failure) ([place])   . Chronic systolic dysfunction of left ventricle   . Diabetes mellitus without complication (HCC)    on oral meds  . GERD (gastroesophageal reflux disease)   . Headache(784.0)    migraines, hasn't had one for over a year  . History of noncompliance with medical treatment   . Hypercholesteremia   . Hypertension   . LVH (left ventricular hypertrophy)   . Nonischemic cardiomyopathy ([place])   . Polysubstance abuse ([place])   . Tobacco abuse disorder      Family History  Problem Relation Age of Onset  . Diabetes Mother   . Hypertension Mother   . Heart disease Mother   . Heart disease Father   . Diabetes Father   . Hypertension Father   . Breast cancer Neg Hx      Current Outpatient Medications:  .  aspirin 81 MG EC tablet, Take 81 mg by mouth daily. Swallow whole., Disp: , Rfl:  .  atorvastatin (LIPITOR) 40 MG tablet, , Disp: , Rfl:  .  baclofen (LIORESAL) 10 MG tablet, Take 10 mg by mouth 2 (two) times daily. , Disp: , Rfl:  .  carvedilol (COREG) 25 MG tablet, Take 1 tablet (25 mg total) by mouth 2 (two) times daily with a meal., Disp: 60 tablet, Rfl: 2 .  Cholecalciferol (VITAMIN D3) 2000 units TABS, Take 2,000 Units/day by mouth every morning., Disp: , Rfl:  .  dapagliflozin propanediol (FARXIGA) 5 MG TABS tablet, Take 5 mg by mouth daily.,  Disp: 30 tablet, Rfl: 5 .  diphenhydrAMINE (BENADRYL) 25 MG tablet, Take by mouth., Disp: , Rfl:  .  folic acid (FOLVITE) 1 MG tablet, Take 1 tablet (1 mg total) by mouth daily., Disp: 90 tablet, Rfl: 1 .  lisinopril (PRINIVIL,ZESTRIL) 20 MG tablet, Take 20 mg by mouth daily., Disp: , Rfl:  .  magnesium oxide (MAG-OX) 400 (241.3 Mg) MG tablet, Take 1 tablet by mouth daily., Disp: , Rfl: 1 .  Nutritional Supplements (L-GLUTAMINE/CHOLINE/INOSITOL PO), Take by mouth., Disp: , Rfl:  .  omeprazole (PRILOSEC) 40 MG capsule, Take 40 mg by mouth daily., Disp: , Rfl:  .  pyridOXINE (VITAMIN B-6) 100 MG tablet, Take 100 mg by mouth daily., Disp: , Rfl:  .  spironolactone (ALDACTONE) 25 MG tablet, Take 25 mg by mouth 2 (two) times daily., Disp: , Rfl:    Allergies  Allergen Reactions  . Codeine Nausea And Vomiting     Review of Systems  Constitutional: Positive for appetite change (she has decreased appetite. given rx remeron at her last visit. she did not try it. scared of possible side effects).  Respiratory: Negative.   Cardiovascular: Negative.   Gastrointestinal: Negative.   Neurological: Negative.  Seizures:  Psychiatric/Behavioral: Negative.      Today's Vitals   11/29/18 1500  BP: 112/70  Pulse: 72  Temp: 98.2 F (36.8 C)  TempSrc: Oral  Weight: 132 lb 9.6 oz (60.1 kg)  Height: _0  (1.6 m)  PainSc: 5   PainLoc: Back   Body mass index is 23.49 kg/m.   Objective:  Physical Exam Vitals signs and nursing note reviewed.  Constitutional:      Appearance: Normal appearance.  HENT:     Head: Normocephalic and atraumatic.  Cardiovascular:     Rate and Rhythm: Normal rate and regular rhythm.     Heart sounds: Normal heart sounds.  Pulmonary:     Effort: Pulmonary effort is normal.     Breath sounds: Normal breath sounds.  Neurological:     General: No focal deficit present.     Mental Status: She is alert.         Assessment And Plan:     1. Type 2 diabetes mellitus  with stage 2 chronic kidney disease, without long-term current use of insulin ([place])  She will continue with [place]. A prescription will be sent to the pharmacy. She will rto in 2 months for her next a1c evaluation.   2. Loss of appetite  She is hesitant to try mirtazapine due to possible side effects. She will think about it some more and let me know if she changes her mind.   3. Drug therapy  - BMP8+EGFR        Maximino Greenland, MD

## 2018-12-15 ENCOUNTER — Telehealth: Payer: Self-pay | Admitting: Internal Medicine

## 2018-12-15 ENCOUNTER — Other Ambulatory Visit: Payer: Self-pay | Admitting: Internal Medicine

## 2018-12-15 MED ORDER — DAPAGLIFLOZIN PROPANEDIOL 5 MG PO TABS: 5.0000 mg | ORAL_TABLET | Freq: Every day | ORAL | 5 refills | Status: DC

## 2018-12-15 NOTE — Telephone Encounter (Signed)
PA STARTED FOR FARXIGA 5MG -KEY#[id number]

## 2018-12-18 ENCOUNTER — Other Ambulatory Visit: Payer: Self-pay

## 2018-12-18 MED ORDER — DAPAGLIFLOZIN PROPANEDIOL 5 MG PO TABS: 5.0000 mg | ORAL_TABLET | Freq: Every day | ORAL | 5 refills | Status: DC

## 2018-12-20 ENCOUNTER — Other Ambulatory Visit: Payer: Self-pay

## 2018-12-20 DIAGNOSIS — I10 Essential (primary) hypertension: Secondary | ICD-10-CM

## 2018-12-20 MED ORDER — DAPAGLIFLOZIN PROPANEDIOL 5 MG PO TABS: 5.0000 mg | ORAL_TABLET | Freq: Every day | ORAL | 5 refills | Status: DC

## 2018-12-22 ENCOUNTER — Other Ambulatory Visit: Payer: Self-pay | Admitting: Internal Medicine

## 2018-12-22 DIAGNOSIS — I10 Essential (primary) hypertension: Secondary | ICD-10-CM

## 2018-12-22 MED ORDER — DAPAGLIFLOZIN PROPANEDIOL 5 MG PO TABS: 5.0000 mg | ORAL_TABLET | Freq: Every day | ORAL | 5 refills | Status: DC

## 2018-12-28 LAB — HM DIABETES EYE EXAM

## 2019-01-15 ENCOUNTER — Other Ambulatory Visit: Payer: Self-pay | Admitting: Internal Medicine

## 2019-02-15 ENCOUNTER — Ambulatory Visit (INDEPENDENT_AMBULATORY_CARE_PROVIDER_SITE_OTHER): Payer: Medicare Other | Admitting: Internal Medicine

## 2019-02-15 ENCOUNTER — Other Ambulatory Visit: Payer: Self-pay

## 2019-02-15 ENCOUNTER — Encounter: Payer: Self-pay | Admitting: Internal Medicine

## 2019-02-15 VITALS — BP 126/74 | HR 76 | Temp 97.7°F | Ht 63.0 in | Wt 136.4 lb

## 2019-02-15 DIAGNOSIS — I130 Hypertensive heart and chronic kidney disease with heart failure and stage 1 through stage 4 chronic kidney disease, or unspecified chronic kidney disease: Secondary | ICD-10-CM

## 2019-02-15 DIAGNOSIS — I519 Heart disease, unspecified: Secondary | ICD-10-CM

## 2019-02-15 DIAGNOSIS — E1122 Type 2 diabetes mellitus with diabetic chronic kidney disease: Secondary | ICD-10-CM | POA: Diagnosis not present

## 2019-02-15 DIAGNOSIS — N182 Chronic kidney disease, stage 2 (mild): Secondary | ICD-10-CM | POA: Diagnosis not present

## 2019-02-15 DIAGNOSIS — M1712 Unilateral primary osteoarthritis, left knee: Secondary | ICD-10-CM

## 2019-02-15 MED ORDER — MAGNESIUM OXIDE 400 (241.3 MG) MG PO TABS: 1.0000 | ORAL_TABLET | Freq: Every day | ORAL | 1 refills | Status: AC

## 2019-02-15 MED ORDER — ATORVASTATIN CALCIUM 40 MG PO TABS: 40.0000 mg | ORAL_TABLET | Freq: Every day | ORAL | 2 refills | Status: AC

## 2019-02-15 MED ORDER — LISINOPRIL 20 MG PO TABS: 20.0000 mg | ORAL_TABLET | Freq: Every day | ORAL | 2 refills | Status: AC

## 2019-02-15 NOTE — Patient Instructions (Signed)
Diabetes Mellitus and Exercise Exercising regularly is important for your overall health, especially when you have diabetes (diabetes mellitus). Exercising is not only about losing weight. It has many other health benefits, such as increasing muscle strength and bone density and reducing body fat and stress. This leads to improved fitness, flexibility, and endurance, all of which result in better overall health. Exercise has additional benefits for people with diabetes, including:  Reducing appetite.  Helping to lower and control blood glucose.  Lowering blood pressure.  Helping to control amounts of fatty substances (lipids) in the blood, such as cholesterol and triglycerides.  Helping the body to respond better to insulin (improving insulin sensitivity).  Reducing how much insulin the body needs.  Decreasing the risk for heart disease by: ? Lowering cholesterol and triglyceride levels. ? Increasing the levels of good cholesterol. ? Lowering blood glucose levels. What is my activity plan? Your health care provider or certified diabetes educator can help you make a plan for the type and frequency of exercise (activity plan) that works for you. Make sure that you:  Do at least 150 minutes of moderate-intensity or vigorous-intensity exercise each week. This could be brisk walking, biking, or water aerobics. ? Do stretching and strength exercises, such as yoga or weightlifting, at least 2 times a week. ? Spread out your activity over at least 3 days of the week.  Get some form of physical activity every day. ? Do not go more than 2 days in a row without some kind of physical activity. ? Avoid being inactive for more than 30 minutes at a time. Take frequent breaks to walk or stretch.  Choose a type of exercise or activity that you enjoy, and set realistic goals.  Start slowly, and gradually increase the intensity of your exercise over time. What do I need to know about managing my  diabetes?   Check your blood glucose before and after exercising. ? If your blood glucose is 240 mg/dL (13.3 mmol/L) or higher before you exercise, check your urine for ketones. If you have ketones in your urine, do not exercise until your blood glucose returns to normal. ? If your blood glucose is 100 mg/dL (5.6 mmol/L) or lower, eat a snack containing 15-20 grams of carbohydrate. Check your blood glucose 15 minutes after the snack to make sure that your level is above 100 mg/dL (5.6 mmol/L) before you start your exercise.  Know the symptoms of low blood glucose (hypoglycemia) and how to treat it. Your risk for hypoglycemia increases during and after exercise. Common symptoms of hypoglycemia can include: ? Hunger. ? Anxiety. ? Sweating and feeling clammy. ? Confusion. ? Dizziness or feeling light-headed. ? Increased heart rate or palpitations. ? Blurry vision. ? Tingling or numbness around the mouth, lips, or tongue. ? Tremors or shakes. ? Irritability.  Keep a rapid-acting carbohydrate snack available before, during, and after exercise to help prevent or treat hypoglycemia.  Avoid injecting insulin into areas of the body that are going to be exercised. For example, avoid injecting insulin into: ? The arms, when playing tennis. ? The legs, when jogging.  Keep records of your exercise habits. Doing this can help you and your health care provider adjust your diabetes management plan as needed. Write down: ? Food that you eat before and after you exercise. ? Blood glucose levels before and after you exercise. ? The type and amount of exercise you have done. ? When your insulin is expected to peak, if you use   insulin. Avoid exercising at times when your insulin is peaking.  When you start a new exercise or activity, work with your health care provider to make sure the activity is safe for you, and to adjust your insulin, medicines, or food intake as needed.  Drink plenty of water while  you exercise to prevent dehydration or heat stroke. Drink enough fluid to keep your urine clear or pale yellow. Summary  Exercising regularly is important for your overall health, especially when you have diabetes (diabetes mellitus).  Exercising has many health benefits, such as increasing muscle strength and bone density and reducing body fat and stress.  Your health care provider or certified diabetes educator can help you make a plan for the type and frequency of exercise (activity plan) that works for you.  When you start a new exercise or activity, work with your health care provider to make sure the activity is safe for you, and to adjust your insulin, medicines, or food intake as needed. This information is not intended to replace advice given to you by your health care provider. Make sure you discuss any questions you have with your health care provider. Document Released: 01/08/2004 Document Revised: 04/28/2017 Document Reviewed: 03/29/2016 Elsevier Interactive Patient Education  2019 [employer]  

## 2019-02-16 ENCOUNTER — Telehealth: Payer: Self-pay

## 2019-02-16 ENCOUNTER — Other Ambulatory Visit: Payer: Self-pay | Admitting: Internal Medicine

## 2019-02-16 LAB — CMP14+EGFR
ALT: 26 IU/L (ref 0–32)
AST: 29 IU/L (ref 0–40)
Albumin/Globulin Ratio: 1.9 (ref 1.2–2.2)
Albumin: 5 g/dL — ABNORMAL HIGH (ref 3.8–4.9)
Alkaline Phosphatase: 91 IU/L (ref 39–117)
BUN/Creatinine Ratio: 16 (ref 12–28)
BUN: 16 mg/dL (ref 8–27)
Bilirubin Total: 0.5 mg/dL (ref 0.0–1.2)
CO2: 19 mmol/L — ABNORMAL LOW (ref 20–29)
Calcium: 9.8 mg/dL (ref 8.7–10.3)
Chloride: 107 mmol/L — ABNORMAL HIGH (ref 96–106)
Creatinine, Ser: 0.97 mg/dL (ref 0.57–1.00)
GFR calc Af Amer: 73 mL/min/{1.73_m2} (ref >59)
GFR calc non Af Amer: 64 mL/min/{1.73_m2} (ref >59)
Globulin, Total: 2.6 g/dL (ref 1.5–4.5)
Glucose: 106 mg/dL — ABNORMAL HIGH (ref 65–99)
Potassium: 3.9 mmol/L (ref 3.5–5.2)
Sodium: 139 mmol/L (ref 134–144)
Total Protein: 7.6 g/dL (ref 6.0–8.5)

## 2019-02-16 LAB — HEMOGLOBIN A1C
Est. average glucose Bld gHb Est-mCnc: 114 mg/dL
Hgb A1c MFr Bld: 5.6 % (ref 4.8–5.6)

## 2019-02-16 NOTE — Telephone Encounter (Signed)
Spoke with pt and reviewed her labs with her. YRL,RMA

## 2019-02-16 NOTE — Progress Notes (Signed)
Subjective:     Patient ID: [name] , female    DOB: 1961/03/13 , 58 y.o.   MRN: [id number]   Chief Complaint  Patient presents with  . Diabetes  . Hypertension    HPI  Diabetes  She presents for her follow-up diabetic visit. She has type 2 diabetes mellitus. Her disease course has been stable. There are no hypoglycemic associated symptoms. Pertinent negatives for hypoglycemia include no sweats. Pertinent negatives for diabetes include no blurred vision and no chest pain. There are no hypoglycemic complications. Symptoms are stable. Diabetic complications include heart disease. Risk factors for coronary artery disease include dyslipidemia, hypertension, sedentary lifestyle and post-menopausal. Current diabetic treatment includes oral agent (monotherapy). She is compliant with treatment most of the time. An ACE inhibitor/angiotensin II receptor blocker is being taken. Eye exam is current.  Hypertension  This is a chronic problem. The current episode started more than 1 year ago. The problem is controlled. Pertinent negatives include no blurred vision, chest pain, palpitations or sweats.   She adds that Dr. Terrence Dupont, her cardiologist, has taken her off of spironolactone and furosemide.   Past Medical History:  Diagnosis Date  . Arthritis   . Bowel obstruction ([place])   . Bronchitis   . CHF (congestive heart failure) ([place])   . Chronic systolic dysfunction of left ventricle   . Diabetes mellitus without complication (HCC)    on oral meds  . GERD (gastroesophageal reflux disease)   . Headache(784.0)    migraines, hasn't had one for over a year  . History of noncompliance with medical treatment   . Hypercholesteremia   . Hypertension   . LVH (left ventricular hypertrophy)   . Nonischemic cardiomyopathy ([place])   . Polysubstance abuse ([place])   . Tobacco abuse disorder      Family History  Problem Relation Age of Onset  . Diabetes Mother   . Hypertension Mother   . Heart  disease Mother   . Heart disease Father   . Diabetes Father   . Hypertension Father   . Breast cancer Neg Hx      Current Outpatient Medications:  .  aspirin 81 MG EC tablet, Take 81 mg by mouth daily. Swallow whole., Disp: , Rfl:  .  atorvastatin (LIPITOR) 40 MG tablet, Take 1 tablet (40 mg total) by mouth daily at 6 PM., Disp: 90 tablet, Rfl: 2 .  baclofen (LIORESAL) 10 MG tablet, TAKE 1 TABLET BY MOUTH TWO  TIMES DAILY AS NEEDED, Disp: 90 tablet, Rfl: 1 .  carvedilol (COREG) 25 MG tablet, Take 1 tablet (25 mg total) by mouth 2 (two) times daily with a meal., Disp: 60 tablet, Rfl: 2 .  Cholecalciferol (VITAMIN D3) 2000 units TABS, Take 2,000 Units/day by mouth every morning., Disp: , Rfl:  .  CHOLINE-INOSITOL-METHIONINE IM, Inject into the muscle., Disp: , Rfl:  .  dapagliflozin propanediol (FARXIGA) 5 MG TABS tablet, Take 5 mg by mouth daily., Disp: 30 tablet, Rfl: 5 .  folic acid (FOLVITE) 1 MG tablet, Take 1 tablet (1 mg total) by mouth daily., Disp: 90 tablet, Rfl: 1 .  lisinopril (PRINIVIL,ZESTRIL) 20 MG tablet, Take 1 tablet (20 mg total) by mouth daily., Disp: 90 tablet, Rfl: 2 .  magnesium oxide (MAG-OX) 400 (241.3 Mg) MG tablet, Take 1 tablet (400 mg total) by mouth daily., Disp: 90 tablet, Rfl: 1 .  Nutritional Supplements (L-GLUTAMINE/CHOLINE/INOSITOL PO), Take by mouth., Disp: , Rfl:  .  omeprazole (PRILOSEC) 40 MG capsule, Take  40 mg by mouth daily., Disp: , Rfl:  .  pyridOXINE (VITAMIN B-6) 100 MG tablet, Take 100 mg by mouth daily., Disp: , Rfl:    Allergies  Allergen Reactions  . Codeine Nausea And Vomiting     Review of Systems  Constitutional: Negative.   Eyes: Negative for blurred vision.  Respiratory: Negative.   Cardiovascular: Negative.  Negative for chest pain and palpitations.  Gastrointestinal: Negative.   Musculoskeletal: Positive for arthralgias (she c/o L knee pain. There is pain with ambulation. Scheduled to see Dr. Berenice Primas next week. ).  Neurological:  Negative.   Psychiatric/Behavioral: Negative.      Today's Vitals   02/15/19 1035  BP: 126/74  Pulse: 76  Temp: 97.7 F (36.5 C)  TempSrc: Oral  Weight: 136 lb 6.4 oz (61.9 kg)  Height: '5\' 3"'  (1.6 m)  PainSc: 0-No pain   Body mass index is 24.16 kg/m.   Objective:  Physical Exam Vitals signs and nursing note reviewed.  Constitutional:      Appearance: Normal appearance.  HENT:     Head: Normocephalic and atraumatic.  Cardiovascular:     Rate and Rhythm: Normal rate and regular rhythm.     Heart sounds: Normal heart sounds.  Pulmonary:     Effort: Pulmonary effort is normal.     Breath sounds: Normal breath sounds.  Musculoskeletal:        General: Swelling present.     Comments: Left knee swelling, some crepitus  Skin:    General: Skin is warm.  Neurological:     General: No focal deficit present.     Mental Status: She is alert.  Psychiatric:        Mood and Affect: Mood normal.        Behavior: Behavior normal.         Assessment And Plan:     1. Type 2 diabetes mellitus with stage 2 chronic kidney disease, without long-term current use of insulin ([place])  I will check labs as listed below. She is encouraged to incorporate more exercise into her daily routine. She will rto in 3 months for re-evaluation.   - CMP14+EGFR - Hemoglobin A1c  2. Heart & renal disease, hypertensive benign, heart fail/chr renal dis (HCC)  Chronic, yet stable. Importance of dietary and medication compliance was discussed with the patient. She will continue with current meds.   3. Chronic systolic dysfunction of left ventricle  Chronic, yet stable. She has tolerated [place] thus far. She is now off of diuretics.   4. Primary osteoarthritis of left knee  She is scheduled to see Dr. Berenice Primas next week. She is advised to apply topical pain cream to front/back of knee as needed.  Maximino Greenland, MD    THE PATIENT IS ENCOURAGED TO PRACTICE SOCIAL DISTANCING DUE TO THE COVID-19  PANDEMIC.

## 2019-03-02 ENCOUNTER — Encounter: Payer: Self-pay | Admitting: Internal Medicine

## 2019-04-24 ENCOUNTER — Ambulatory Visit (INDEPENDENT_AMBULATORY_CARE_PROVIDER_SITE_OTHER): Payer: Medicare Other

## 2019-04-24 ENCOUNTER — Other Ambulatory Visit: Payer: Self-pay

## 2019-04-24 VITALS — BP 111/80 | HR 80 | Ht 63.0 in | Wt 137.6 lb

## 2019-04-24 DIAGNOSIS — Z0000 Encounter for general adult medical examination without abnormal findings: Secondary | ICD-10-CM | POA: Diagnosis not present

## 2019-04-24 NOTE — Patient Instructions (Signed)
[name] , Thank you for taking time to come for your Medicare Wellness Visit. I appreciate your ongoing commitment to your health goals. Please review the following plan we discussed and let me know if I can assist you in the future.   Screening recommendations/referrals: Colonoscopy: 12/2013 Mammogram: 10/2018 Bone Density: 06/2016 Recommended yearly ophthalmology/optometry visit for glaucoma screening and checkup Recommended yearly dental visit for hygiene and checkup  Vaccinations: Influenza vaccine: 07/2018 Pneumococcal vaccine: 06/2015 Tdap vaccine: 04/2018 Shingles vaccine: discussed    Advanced directives: Advance directive discussed with you today.   Conditions/risks identified: none  Next appointment: 05/22/2019 at 10:30  Preventive Care 40-64 Years, Female Preventive care refers to lifestyle choices and visits with your health care provider that can promote health and wellness. What does preventive care include?  A yearly physical exam. This is also called an annual well check.  Dental exams once or twice a year.  Routine eye exams. Ask your health care provider how often you should have your eyes checked.  Personal lifestyle choices, including:  Daily care of your teeth and gums.  Regular physical activity.  Eating a healthy diet.  Avoiding tobacco and drug use.  Limiting alcohol use.  Practicing safe sex.  Taking low-dose aspirin daily starting at age 47.  Taking vitamin and mineral supplements as recommended by your health care provider. What happens during an annual well check? The services and screenings done by your health care provider during your annual well check will depend on your age, overall health, lifestyle risk factors, and family history of disease. Counseling  Your health care provider may ask you questions about your:  Alcohol use.  Tobacco use.  Drug use.  Emotional well-being.  Home and relationship well-being.  Sexual  activity.  Eating habits.  Work and work Statistician.  Method of birth control.  Menstrual cycle.  Pregnancy history. Screening  You may have the following tests or measurements:  Height, weight, and BMI.  Blood pressure.  Lipid and cholesterol levels. These may be checked every 5 years, or more frequently if you are over 62 years old.  Skin check.  Lung cancer screening. You may have this screening every year starting at age 50 if you have a 30-pack-year history of smoking and currently smoke or have quit within the past 15 years.  Fecal occult blood test (FOBT) of the stool. You may have this test every year starting at age 91.  Flexible sigmoidoscopy or colonoscopy. You may have a sigmoidoscopy every 5 years or a colonoscopy every 10 years starting at age 62.  Hepatitis C blood test.  Hepatitis B blood test.  Sexually transmitted disease (STD) testing.  Diabetes screening. This is done by checking your blood sugar (glucose) after you have not eaten for a while (fasting). You may have this done every 1-3 years.  Mammogram. This may be done every 1-2 years. Talk to your health care provider about when you should start having regular mammograms. This may depend on whether you have a family history of breast cancer.  BRCA-related cancer screening. This may be done if you have a family history of breast, ovarian, tubal, or peritoneal cancers.  Pelvic exam and Pap test. This may be done every 3 years starting at age 85. Starting at age 63, this may be done every 5 years if you have a Pap test in combination with an HPV test.  Bone density scan. This is done to screen for osteoporosis. You may have this scan  if you are at high risk for osteoporosis. Discuss your test results, treatment options, and if necessary, the need for more tests with your health care provider. Vaccines  Your health care provider may recommend certain vaccines, such as:  Influenza vaccine. This is  recommended every year.  Tetanus, diphtheria, and acellular pertussis (Tdap, Td) vaccine. You may need a Td booster every 10 years.  Zoster vaccine. You may need this after age 71.  Pneumococcal 13-valent conjugate (PCV13) vaccine. You may need this if you have certain conditions and were not previously vaccinated.  Pneumococcal polysaccharide (PPSV23) vaccine. You may need one or two doses if you smoke cigarettes or if you have certain conditions. Talk to your health care provider about which screenings and vaccines you need and how often you need them. This information is not intended to replace advice given to you by your health care provider. Make sure you discuss any questions you have with your health care provider. Document Released: 11/14/2015 Document Revised: 07/07/2016 Document Reviewed: 08/19/2015 Elsevier Interactive Patient Education  2017 [place] Prevention in the Home Falls can cause injuries. They can happen to people of all ages. There are many things you can do to make your home safe and to help prevent falls. What can I do on the outside of my home?  Regularly fix the edges of walkways and driveways and fix any cracks.  Remove anything that might make you trip as you walk through a door, such as a raised step or threshold.  Trim any bushes or trees on the path to your home.  Use bright outdoor lighting.  Clear any walking paths of anything that might make someone trip, such as rocks or tools.  Regularly check to see if handrails are loose or broken. Make sure that both sides of any steps have handrails.  Any raised decks and porches should have guardrails on the edges.  Have any leaves, snow, or ice cleared regularly.  Use sand or salt on walking paths during winter.  Clean up any spills in your garage right away. This includes oil or grease spills. What can I do in the bathroom?  Use night lights.  Install grab bars by the toilet and in  the tub and shower. Do not use towel bars as grab bars.  Use non-skid mats or decals in the tub or shower.  If you need to sit down in the shower, use a plastic, non-slip stool.  Keep the floor dry. Clean up any water that spills on the floor as soon as it happens.  Remove soap buildup in the tub or shower regularly.  Attach bath mats securely with double-sided non-slip rug tape.  Do not have throw rugs and other things on the floor that can make you trip. What can I do in the bedroom?  Use night lights.  Make sure that you have a light by your bed that is easy to reach.  Do not use any sheets or blankets that are too big for your bed. They should not hang down onto the floor.  Have a firm chair that has side arms. You can use this for support while you get dressed.  Do not have throw rugs and other things on the floor that can make you trip. What can I do in the kitchen?  Clean up any spills right away.  Avoid walking on wet floors.  Keep items that you use a lot in easy-to-reach places.  If  you need to reach something above you, use a strong step stool that has a grab bar.  Keep electrical cords out of the way.  Do not use floor polish or wax that makes floors slippery. If you must use wax, use non-skid floor wax.  Do not have throw rugs and other things on the floor that can make you trip. What can I do with my stairs?  Do not leave any items on the stairs.  Make sure that there are handrails on both sides of the stairs and use them. Fix handrails that are broken or loose. Make sure that handrails are as long as the stairways.  Check any carpeting to make sure that it is firmly attached to the stairs. Fix any carpet that is loose or worn.  Avoid having throw rugs at the top or bottom of the stairs. If you do have throw rugs, attach them to the floor with carpet tape.  Make sure that you have a light switch at the top of the stairs and the bottom of the stairs. If  you do not have them, ask someone to add them for you. What else can I do to help prevent falls?  Wear shoes that:  Do not have high heels.  Have rubber bottoms.  Are comfortable and fit you well.  Are closed at the toe. Do not wear sandals.  If you use a stepladder:  Make sure that it is fully opened. Do not climb a closed stepladder.  Make sure that both sides of the stepladder are locked into place.  Ask someone to hold it for you, if possible.  Clearly mark and make sure that you can see:  Any grab bars or handrails.  First and last steps.  Where the edge of each step is.  Use tools that help you move around (mobility aids) if they are needed. These include:  Canes.  Walkers.  Scooters.  Crutches.  Turn on the lights when you go into a dark area. Replace any light bulbs as soon as they burn out.  Set up your furniture so you have a clear path. Avoid moving your furniture around.  If any of your floors are uneven, fix them.  If there are any pets around you, be aware of where they are.  Review your medicines with your doctor. Some medicines can make you feel dizzy. This can increase your chance of falling. Ask your doctor what other things that you can do to help prevent falls. This information is not intended to replace advice given to you by your health care provider. Make sure you discuss any questions you have with your health care provider. Document Released: 08/14/2009 Document Revised: 03/25/2016 Document Reviewed: 11/22/2014 Elsevier Interactive Patient Education  2017 [employer].

## 2019-04-24 NOTE — Progress Notes (Signed)
Subjective:   [name] is a 58 y.o. female who presents for Medicare Annual (Subsequent) preventive examination.  This visit type was conducted due to national recommendations for restrictions regarding the COVID-19 Pandemic (e.g. social distancing). This format is felt to be most appropriate for this patient at this time. All issues noted in this document were discussed and addressed. No physical exam was performed (except for noted visual exam findings with Video Visits). This patient, Ms. [name], has given permission to perform this visit via telephone. Vital signs may be absent or patient reported.  Patient location:  At home   Nurse location:  [place] office     Review of Systems:  n/a Cardiac Risk Factors include: diabetes mellitus;hypertension     Objective:     Vitals: BP 111/80 Comment: per patient  Pulse 80 Comment: per patient  Ht 5\' 3"  (1.6 m) Comment: per patient  Wt 137 lb 9.6 oz (62.4 kg) Comment: per patient  BMI 24.37 kg/m   Body mass index is 24.37 kg/m.  Advanced Directives 04/24/2019 03/20/2018 03/14/2017 07/29/2016 03/15/2016 09/22/2015 07/24/2013  Does Patient Have a Medical Advance Directive? No No No No No No Patient does not have advance directive;Patient would like information  Would patient like information on creating a medical advance directive? - - - No - patient declined information No - patient declined information No - patient declined information Advance directive packet given    Tobacco Social History   Tobacco Use  Smoking Status Former Smoker  . Packs/day: 0.50  . Years: 30.00  . Pack years: 15.00  . Quit date: 07/02/2012  . Years since quitting: 6.8  Smokeless Tobacco Never Used  Tobacco Comment   she is not ready to quit     Counseling given: Not Answered Comment: she is not ready to quit   Clinical Intake:  Pre-visit preparation completed: Yes  Pain : No/denies pain Pain Score: 0-No pain     Nutritional  Status: BMI of 19-24  Normal Nutritional Risks: None Diabetes: Yes CBG done?: No Did pt. bring in CBG monitor from home?: No  How often do you need to have someone help you when you read instructions, pamphlets, or other written materials from your doctor or pharmacy?: 1 - Never What is the last grade level you completed in school?: 12th grade  Interpreter Needed?: No  Information entered by :: NAllen LPN  Past Medical History:  Diagnosis Date  . Arthritis   . Bowel obstruction ([place])   . Bronchitis   . CHF (congestive heart failure) ([place])   . Chronic systolic dysfunction of left ventricle   . Diabetes mellitus without complication (HCC)    on oral meds  . GERD (gastroesophageal reflux disease)   . Headache(784.0)    migraines, hasn't had one for over a year  . History of noncompliance with medical treatment   . Hypercholesteremia   . Hypertension   . LVH (left ventricular hypertrophy)   . Nonischemic cardiomyopathy ([place])   . Polysubstance abuse ([place])   . Tobacco abuse disorder    Past Surgical History:  Procedure Laterality Date  . ABDOMINAL HYSTERECTOMY    . ABDOMINAL SURGERY    . left foot surgery    . STRABISMUS SURGERY Left 07/31/2013   Procedure: REPAIR STRABISMUS;  Surgeon: Derry Skill, MD;  Location: [facility];  Service: Ophthalmology;  Laterality: Left;  EYE MUSCLE SURGERY LEFT EYE  . UTERINE FIBROID SURGERY     Family History  Problem Relation Age of Onset  . Diabetes Mother   . Hypertension Mother   . Heart disease Mother   . Heart disease Father   . Diabetes Father   . Hypertension Father   . Breast cancer Neg Hx    Social History   Socioeconomic History  . Marital status: Single    Spouse name: Not on file  . Number of children: 0  . Years of education: 54  . Highest education level: Not on file  Occupational History  . Occupation: N/A  Social Needs  . Financial resource strain: Not on file  . Food insecurity    Worry: Never true     Inability: Never true  . Transportation needs    Medical: No    Non-medical: No  Tobacco Use  . Smoking status: Former Smoker    Packs/day: 0.50    Years: 30.00    Pack years: 15.00    Quit date: 07/02/2012    Years since quitting: 6.8  . Smokeless tobacco: Never Used  . Tobacco comment: she is not ready to quit  Substance and Sexual Activity  . Alcohol use: Yes    Alcohol/week: 0.0 standard drinks    Comment: occassionally  . Drug use: Yes    Types: Marijuana  . Sexual activity: Not Currently  Lifestyle  . Physical activity    Days per week: 2 days    Minutes per session: 60 min  . Stress: Not at all  Relationships  . Social Herbalist on phone: Not on file    Gets together: Not on file    Attends religious service: Not on file    Active member of club or organization: Not on file    Attends meetings of clubs or organizations: Not on file    Relationship status: Not on file  Other Topics Concern  . Not on file  Social History Narrative   Lives in [place]   Disabled   Occasional caffeine use     Outpatient Encounter Medications as of 04/24/2019  Medication Sig  . aspirin 81 MG EC tablet Take 81 mg by mouth daily. Swallow whole.  Marland Kitchen atorvastatin (LIPITOR) 40 MG tablet Take 1 tablet (40 mg total) by mouth daily at 6 PM.  . baclofen (LIORESAL) 10 MG tablet TAKE 1 TABLET BY MOUTH TWO  TIMES DAILY AS NEEDED  . carvedilol (COREG) 25 MG tablet Take 1 tablet (25 mg total) by mouth 2 (two) times daily with a meal.  . Cholecalciferol (VITAMIN D3) 2000 units TABS Take 2,000 Units/day by mouth every morning.  . dapagliflozin propanediol (FARXIGA) 5 MG TABS tablet Take 5 mg by mouth daily.  . folic acid (FOLVITE) 1 MG tablet Take 1 tablet (1 mg total) by mouth daily.  Marland Kitchen gabapentin (NEURONTIN) 300 MG capsule Take 300 mg by mouth 2 (two) times daily.  Marland Kitchen lisinopril (PRINIVIL,ZESTRIL) 20 MG tablet Take 1 tablet (20 mg total) by mouth daily.  . magnesium oxide (MAG-OX) 400  (241.3 Mg) MG tablet Take 1 tablet (400 mg total) by mouth daily.  . meloxicam (MOBIC) 15 MG tablet Take 15 mg by mouth daily.  . Nutritional Supplements (L-GLUTAMINE/CHOLINE/INOSITOL PO) Take by mouth.  Marland Kitchen omeprazole (PRILOSEC) 40 MG capsule Take 40 mg by mouth daily.  Marland Kitchen pyridOXINE (VITAMIN B-6) 100 MG tablet Take 100 mg by mouth daily.  Marland Kitchen spironolactone (ALDACTONE) 25 MG tablet Take 25 mg by mouth daily.  . CHOLINE-INOSITOL-METHIONINE IM Inject into the muscle.  No facility-administered encounter medications on file as of 04/24/2019.     Activities of Daily Living In your present state of health, do you have any difficulty performing the following activities: 04/24/2019  Hearing? N  Vision? Y  Comment slightly blurred  Difficulty concentrating or making decisions? Y  Comment some forgetfulness  Walking or climbing stairs? N  Dressing or bathing? N  Doing errands, shopping? N  Preparing Food and eating ? N  Using the Toilet? N  In the past six months, have you accidently leaked urine? N  Do you have problems with loss of bowel control? N  Managing your Medications? N  Managing your Finances? N  Housekeeping or managing your Housekeeping? N  Some recent data might be hidden    Patient Care Team: Glendale Chard, MD as PCP - General (Internal Medicine) Charolette Forward, MD as Consulting Physician (Cardiology) Melida Quitter, MD as Consulting Physician (Otolaryngology) [facility]    Assessment:   This is a routine wellness examination for Vanesa.  Exercise Activities and Dietary recommendations Current Exercise Habits: Home exercise routine, Type of exercise: walking, Time (Minutes): 60, Frequency (Times/Week): 7, Weekly Exercise (Minutes/Week): 420  Goals    . Patient Stated     Stay alive Not get Covid-19       Fall Risk Fall Risk  04/24/2019 02/15/2019 11/29/2018 10/16/2018 03/20/2018  Falls in the past year? 1 1 0 1 No  Number falls in past yr: 0 0 -  0 -  Comment misstepped - - - -  Injury with Fall? 0 0 - 0 -  Risk for fall due to : History of fall(s);Medication side effect - - - -  Follow up Falls prevention discussed - - - -   Is the patient's home free of loose throw rugs in walkways, pet beds, electrical cords, etc?   yes      Grab bars in the bathroom? no      Handrails on the stairs?   yes      Adequate lighting?   yes  Timed Get Up and Go performed: n/a  Depression Screen PHQ 2/9 Scores 04/24/2019 11/29/2018 10/16/2018 03/20/2018  PHQ - 2 Score 0 0 0 0  PHQ- 9 Score 1 - - -     Cognitive Function     6CIT Screen 04/24/2019  What Year? 0 points  What month? 0 points  What time? 0 points  Count back from 20 0 points  Months in reverse 4 points  Repeat phrase 0 points  Total Score 4    Immunization History  Administered Date(s) Administered  . DTaP 04/09/2018  . Influenza Split 11/24/2011  . Influenza-Unspecified 07/28/2018    Qualifies for Shingles Vaccine? yes  Screening Tests Health Maintenance  Topic Date Due  . FOOT EXAM  12/20/1968  . PAP SMEAR-Modifier  12/21/1979  . INFLUENZA VACCINE  06/02/2019  . HEMOGLOBIN A1C  08/17/2019  . OPHTHALMOLOGY EXAM  12/29/2019  . MAMMOGRAM  10/06/2020  . COLONOSCOPY  12/08/2023  . TETANUS/TDAP  04/07/2028  . PNEUMOCOCCAL POLYSACCHARIDE VACCINE AGE 61-64 HIGH RISK  Completed  . Hepatitis C Screening  Completed  . HIV Screening  Completed    Cancer Screenings: Lung: Low Dose CT Chest recommended if Age 4-80 years, 30 pack-year currently smoking OR have quit w/in 15years. Patient does not qualify. Breast:  Up to date on Mammogram? Yes   Up to date of Bone Density/Dexa? Yes Colorectal: up to date  Additional Screenings: :  Hepatitis C Screening: 04/11/2013     Plan:    6 CIT was 4. Declines shingles vaccine.   I have personally reviewed and noted the following in the patient's chart:   . Medical and social history . Use of alcohol, tobacco or illicit drugs   . Current medications and supplements . Functional ability and status . Nutritional status . Physical activity . Advanced directives . List of other physicians . Hospitalizations, surgeries, and ER visits in previous 12 months . Vitals . Screenings to include cognitive, depression, and falls . Referrals and appointments  In addition, I have reviewed and discussed with patient certain preventive protocols, quality metrics, and best practice recommendations. A written personalized care plan for preventive services as well as general preventive health recommendations were provided to patient.     Kellie Simmering, LPN  06/18/5908

## 2019-05-03 ENCOUNTER — Encounter: Payer: Self-pay | Admitting: Internal Medicine

## 2019-05-04 ENCOUNTER — Encounter: Payer: Self-pay | Admitting: Internal Medicine

## 2019-05-07 ENCOUNTER — Other Ambulatory Visit: Payer: Self-pay | Admitting: Radiation Therapy

## 2019-05-07 DIAGNOSIS — D333 Benign neoplasm of cranial nerves: Secondary | ICD-10-CM

## 2019-05-08 ENCOUNTER — Ambulatory Visit: Payer: Medicare Other | Admitting: Sports Medicine

## 2019-05-10 ENCOUNTER — Other Ambulatory Visit: Payer: Self-pay

## 2019-05-10 ENCOUNTER — Encounter: Payer: Self-pay | Admitting: Internal Medicine

## 2019-05-10 ENCOUNTER — Ambulatory Visit (INDEPENDENT_AMBULATORY_CARE_PROVIDER_SITE_OTHER): Payer: Medicare Other | Admitting: Internal Medicine

## 2019-05-10 VITALS — BP 120/68 | HR 60 | Temp 98.4°F | Ht 62.8 in | Wt 136.8 lb

## 2019-05-10 DIAGNOSIS — I10 Essential (primary) hypertension: Secondary | ICD-10-CM

## 2019-05-10 DIAGNOSIS — G44209 Tension-type headache, unspecified, not intractable: Secondary | ICD-10-CM | POA: Diagnosis not present

## 2019-05-10 DIAGNOSIS — F1010 Alcohol abuse, uncomplicated: Secondary | ICD-10-CM | POA: Diagnosis not present

## 2019-05-10 DIAGNOSIS — G8929 Other chronic pain: Secondary | ICD-10-CM

## 2019-05-10 NOTE — Patient Instructions (Signed)
Cluster Headache °A cluster headache is a type of headache that causes deep, intense head pain. Cluster headaches can last from 15 minutes to 3 hours. They usually occur: °· On one side of the head. They may occur on the other side when a new cluster of headaches begins. °· Repeatedly over weeks to months. °· Several times a day. °· At the same time of day, often at night. °· More often in the fall and springtime. °What are the causes? °The cause of this condition is not known. °What increases the risk? °This condition is more likely to develop in: °· Males. °· People who drink alcohol. °· People who smoke or use products that contain nicotine or tobacco. °· People who take medicines that cause blood vessels to expand, such as nitroglycerin. °· People who take antihistamines. °What are the signs or symptoms? °Symptoms of this condition include: °· Severe pain on one side of the head that begins behind or around your eye or temple. °· Pain on one side of the head. °· Nausea. °· Sensitivity to light. °· Runny nose and nasal stuffiness. °· Sweaty, pale skin on the face. °· Droopy or swollen eyelid, eye redness, or tearing. °· Restlessness and agitation. °How is this diagnosed? °This condition may be diagnosed based on: °· Your symptoms. °· A physical exam. °Your health care provider may order tests to see if your headaches are caused by another medical condition. These tests may show that you do not have cluster headaches. Tests may include: °· A CT scan of your head. °· An MRI of your head. °· Lab tests. °How is this treated? °This condition may be treated with: °· Medicines to relieve pain and to prevent repeated (recurrent) attacks. Some people may need a combination of medicines. °· Oxygen. This helps to relieve pain. °Follow these instructions at home: °Headache diary °Keep a headache diary as told by your health care provider. Doing this can help you and your health care provider figure out what triggers your  headaches. In your headache diary, include information about: °· The time of day that your headache started and what you were doing when it began. °· How long your headache lasted. °· Where your pain started and whether it moved to other areas. °· The type of pain, such as burning, stabbing, throbbing, or cramping. °· Your level of pain. Use a pain scale and rate the pain with a number from 1 (mild) up to 10 (severe). °· The treatment that you used, and any change in symptoms after treatment. ° °Medicines °· Take over-the-counter and prescription medicines only as told by your health care provider. °· Do not drive or use heavy machinery while taking prescription pain medicine. °· Use oxygen as told by your health care provider. °Lifestyle °· Follow a regular sleep schedule. Do not vary the time that you go to bed or the amount that you sleep from day to day. It is important to stay on the same schedule during a cluster period to help prevent headaches. °· Exercise regularly. °· Eat a healthy diet and avoid foods that may trigger your headaches. °· Avoid alcohol. °· Do not use any products that contain nicotine or tobacco, such as cigarettes and e-cigarettes. If you need help quitting, ask your health care provider. °Contact a health care provider if: °· Your headaches change, become more severe, or occur more often. °· The medicine or oxygen that your health care provider recommended does not help. °Get help right away   if: °· You faint. °· You have weakness or numbness, especially on one side of your body or face. °· You have double vision. °· You have nausea or vomiting that does not go away within several hours. °· You have trouble talking, walking, or keeping your balance. °· You have pain or stiffness in your neck. °· You have a fever. °Summary °· A cluster headache is a type of headache that causes deep, intense head pain, usually on one side of the head. °· Keep a headache diary to help discover what triggers  your headaches. °· A regular sleep schedule can help prevent headaches. °This information is not intended to replace advice given to you by your health care provider. Make sure you discuss any questions you have with your health care provider. °Document Released: 10/18/2005 Document Revised: 11/02/2017 Document Reviewed: 06/29/2016 °Elsevier Patient Education © 2020 [employer] ° °

## 2019-05-10 NOTE — Progress Notes (Signed)
Subjective:     Patient ID: [name] , female    DOB: 1961/09/06 , 58 y.o.   MRN: [id number]   Chief Complaint  Patient presents with  . Headache    HPI Pt is a 58 y/o with history of alcohol abuse and had migraines in the past, but had been gone. Onset of acute HA 3 weeks ago, HA on L orbit area which started gradual, and a few times woke up with in. The first time she had been drinking, and the recurrence  Seems when she was drinking. She stopped drinking and still had them mild and some days would not have any. Had photophobia. While drinking she had HA's qd. Then once she stopped drinking she still having it qd, but only lasting a few hours. Has been awaken in her sleep from the HA still. Only a couple of times she had nausea. One of those days when she was drinking a different Tequila brand she had a severe HA and was worried she was going to have a stroke. There was another liquor in a Tequila brand, and had it again. With in the past week she drank 1/2 a beer and felt it coming on. Has been taking Tylenol for pain which has helped a little.  Had eye xm 2 days ago and does not need new glasses.  Denies dizziness or fainting.  Her cancer MD ordered a head MRI for Sept and she moved it to this month.   Past Medical History:  Diagnosis Date  . Arthritis   . Bowel obstruction ([place])   . Bronchitis   . CHF (congestive heart failure) ([place])   . Chronic systolic dysfunction of left ventricle   . Diabetes mellitus without complication (HCC)    on oral meds  . GERD (gastroesophageal reflux disease)   . Headache(784.0)    migraines, hasn't had one for over a year  . History of noncompliance with medical treatment   . Hypercholesteremia   . Hypertension   . LVH (left ventricular hypertrophy)   . Nonischemic cardiomyopathy ([place])   . Polysubstance abuse ([place])   . Tobacco abuse disorder      Family History  Problem Relation Age of Onset  . Diabetes Mother   . Hypertension Mother    . Heart disease Mother   . Heart disease Father   . Diabetes Father   . Hypertension Father   . Breast cancer Neg Hx      Current Outpatient Medications:  .  aspirin 81 MG EC tablet, Take 81 mg by mouth daily. Swallow whole., Disp: , Rfl:  .  atorvastatin (LIPITOR) 40 MG tablet, Take 1 tablet (40 mg total) by mouth daily at 6 PM., Disp: 90 tablet, Rfl: 2 .  baclofen (LIORESAL) 10 MG tablet, TAKE 1 TABLET BY MOUTH TWO  TIMES DAILY AS NEEDED, Disp: 90 tablet, Rfl: 1 .  carvedilol (COREG) 25 MG tablet, Take 1 tablet (25 mg total) by mouth 2 (two) times daily with a meal., Disp: 60 tablet, Rfl: 2 .  Cholecalciferol (VITAMIN D3) 2000 units TABS, Take 2,000 Units/day by mouth every morning., Disp: , Rfl:  .  CHOLINE-INOSITOL-METHIONINE IM, Inject into the muscle., Disp: , Rfl:  .  dapagliflozin propanediol (FARXIGA) 5 MG TABS tablet, Take 5 mg by mouth daily., Disp: 30 tablet, Rfl: 5 .  folic acid (FOLVITE) 1 MG tablet, Take 1 tablet (1 mg total) by mouth daily., Disp: 90 tablet, Rfl: 1 .  gabapentin (NEURONTIN)  300 MG capsule, Take 300 mg by mouth 2 (two) times daily., Disp: , Rfl:  .  lisinopril (PRINIVIL,ZESTRIL) 20 MG tablet, Take 1 tablet (20 mg total) by mouth daily., Disp: 90 tablet, Rfl: 2 .  magnesium oxide (MAG-OX) 400 (241.3 Mg) MG tablet, Take 1 tablet (400 mg total) by mouth daily., Disp: 90 tablet, Rfl: 1 .  meloxicam (MOBIC) 15 MG tablet, Take 15 mg by mouth daily., Disp: , Rfl:  .  Nutritional Supplements (L-GLUTAMINE/CHOLINE/INOSITOL PO), Take by mouth., Disp: , Rfl:  .  omeprazole (PRILOSEC) 40 MG capsule, Take 40 mg by mouth daily., Disp: , Rfl:  .  pyridOXINE (VITAMIN B-6) 100 MG tablet, Take 100 mg by mouth daily., Disp: , Rfl:  .  spironolactone (ALDACTONE) 25 MG tablet, Take 25 mg by mouth daily., Disp: , Rfl:    Allergies  Allergen Reactions  . Codeine Nausea And Vomiting     Review of Systems  No nose congestion, fever, chills, sweats, aches, dizziness, fainting,  weakness, paresthesia. Got occasional nausea, and only once when it was severe vomited a little.   Today's Vitals   05/10/19 1207  BP: 120/68  Pulse: 60  Temp: 98.4 F (36.9 C)  TempSrc: Oral  SpO2: 98%  Weight: 136 lb 12.8 oz (62.1 kg)  Height: 5' 2.8" (1.595 m)   Body mass index is 24.39 kg/m.   Objective:  Physical Exam  Physical Exam Vitals signs and nursing note reviewed.  Constitutional:      General: He is not in acute distress.    Appearance: He is well-developed and normal weight. He is not ill-appearing, toxic-appearing or diaphoretic.  HENT:     Head: Normocephalic. Her sinuses are mildly tender all over.  Eyes:     Extraocular Movements: Extraocular movements intact.     Pupils: Pupils are equal, round, and reactive to light.  Neck:     Musculoskeletal: Neck supple. No neck rigidity.     Meningeal: Brudzinski's sign absent.  Cardiovascular:     Rate and Rhythm: Normal rate and regular rhythm.     Heart sounds: No murmur.  Pulmonary:     Effort: Pulmonary effort is normal.     Breath sounds: Normal breath sounds. No wheezing, rhonchi or rales.  Abdominal:     General: Bowel sounds are normal.     Palpations: Abdomen is soft. There is no mass.     Tenderness: There is no abdominal tenderness. There is no guarding.  Musculoskeletal: Normal range of motion.  Lymphadenopathy:     Cervical: No cervical adenopathy.  Skin:    General: Skin is warm and dry.  Neurological:     Mental Status: He is alert.     Cranial Nerves: No cranial nerve deficit or facial asymmetry.     Sensory: No sensory deficit.     Motor: No weakness.     Coordination: Romberg sign negative. Coordination normal.     Gait: Gait normal.     Deep Tendon Reflexes: Reflexes normal.     Comments: Normal Romberg, propioception, finger to nose, tandem gait.   Psychiatric:        Mood and Affect: Mood normal.        Speech: Speech normal.        Behavior: Behavior normal.      Assessment  And Plan:   1. Acute non intractable tension-type headache- seems to be triggered by [place]. Advised to d/c drinking completely. She has moved her head MRI apt  to this month and will follow with her ENT.   2. Alcohol abuse- chronic.  - B12 and Folate Panel - CMP14 + Anion Gap - Gamma GT, GGT (24932)  We will inform  her when we get her results. I explained to her, that I am here only once a week.   Cassie [place] RODRIGUEZ-SOUTHWORTH, PA-C    THE PATIENT IS ENCOURAGED TO PRACTICE SOCIAL DISTANCING DUE TO THE COVID-19 PANDEMIC.

## 2019-05-11 LAB — CMP14 + ANION GAP
ALT: 20 IU/L (ref 0–32)
AST: 20 IU/L (ref 0–40)
Albumin/Globulin Ratio: 1.9 (ref 1.2–2.2)
Albumin: 4.4 g/dL (ref 3.8–4.9)
Alkaline Phosphatase: 79 IU/L (ref 39–117)
Anion Gap: 15 mmol/L (ref 10.0–18.0)
BUN/Creatinine Ratio: 19 (ref 12–28)
BUN: 21 mg/dL (ref 8–27)
Bilirubin Total: 0.2 mg/dL (ref 0.0–1.2)
CO2: 22 mmol/L (ref 20–29)
Calcium: 9.6 mg/dL (ref 8.7–10.3)
Chloride: 107 mmol/L — ABNORMAL HIGH (ref 96–106)
Creatinine, Ser: 1.08 mg/dL — ABNORMAL HIGH (ref 0.57–1.00)
GFR calc Af Amer: 64 mL/min/{1.73_m2} (ref >59)
GFR calc non Af Amer: 56 mL/min/{1.73_m2} — ABNORMAL LOW (ref >59)
Globulin, Total: 2.3 g/dL (ref 1.5–4.5)
Glucose: 99 mg/dL (ref 65–99)
Potassium: 5.1 mmol/L (ref 3.5–5.2)
Sodium: 144 mmol/L (ref 134–144)
Total Protein: 6.7 g/dL (ref 6.0–8.5)

## 2019-05-11 LAB — B12 AND FOLATE PANEL
Folate: >20 ng/mL (ref >3.0)
Vitamin B-12: 449 pg/mL (ref 232–1245)

## 2019-05-11 LAB — GAMMA GT: GGT: 23 IU/L (ref 0–60)

## 2019-05-22 ENCOUNTER — Other Ambulatory Visit: Payer: Self-pay | Admitting: Radiation Oncology

## 2019-05-22 ENCOUNTER — Other Ambulatory Visit: Payer: Self-pay

## 2019-05-22 ENCOUNTER — Encounter: Payer: Self-pay | Admitting: Internal Medicine

## 2019-05-22 ENCOUNTER — Ambulatory Visit (INDEPENDENT_AMBULATORY_CARE_PROVIDER_SITE_OTHER): Payer: Medicare Other | Admitting: Internal Medicine

## 2019-05-22 ENCOUNTER — Telehealth: Payer: Self-pay | Admitting: Radiation Therapy

## 2019-05-22 VITALS — BP 110/70 | HR 73 | Temp 98.0°F | Ht 62.8 in | Wt 137.0 lb

## 2019-05-22 DIAGNOSIS — I1310 Hypertensive heart and chronic kidney disease without heart failure, with stage 1 through stage 4 chronic kidney disease, or unspecified chronic kidney disease: Secondary | ICD-10-CM

## 2019-05-22 DIAGNOSIS — N182 Chronic kidney disease, stage 2 (mild): Secondary | ICD-10-CM | POA: Diagnosis not present

## 2019-05-22 DIAGNOSIS — R519 Headache, unspecified: Secondary | ICD-10-CM

## 2019-05-22 DIAGNOSIS — E1122 Type 2 diabetes mellitus with diabetic chronic kidney disease: Secondary | ICD-10-CM

## 2019-05-22 DIAGNOSIS — I130 Hypertensive heart and chronic kidney disease with heart failure and stage 1 through stage 4 chronic kidney disease, or unspecified chronic kidney disease: Secondary | ICD-10-CM

## 2019-05-22 DIAGNOSIS — Z79899 Other long term (current) drug therapy: Secondary | ICD-10-CM

## 2019-05-22 DIAGNOSIS — R51 Headache: Secondary | ICD-10-CM

## 2019-05-22 DIAGNOSIS — I519 Heart disease, unspecified: Secondary | ICD-10-CM

## 2019-05-22 MED ORDER — LORAZEPAM 0.5 MG PO TABS: ORAL_TABLET | ORAL | 0 refills | Status: AC

## 2019-05-22 NOTE — Progress Notes (Signed)
Subjective:     Patient ID: [name] , female    DOB: 04-Dec-1960 , 58 y.o.   MRN: [id number]   Chief Complaint  Patient presents with  . Diabetes    foot exam  . Hypertension    HPI  Diabetes She presents for her follow-up diabetic visit. She has type 2 diabetes mellitus. Her disease course has been stable. Hypoglycemia symptoms include headaches. Pertinent negatives for hypoglycemia include no sweats. Pertinent negatives for diabetes include no blurred vision and no chest pain. There are no hypoglycemic complications. Symptoms are stable. Diabetic complications include heart disease. Risk factors for coronary artery disease include dyslipidemia, hypertension, sedentary lifestyle and post-menopausal. Current diabetic treatment includes oral agent (monotherapy). She is compliant with treatment most of the time. An ACE inhibitor/angiotensin II receptor blocker is being taken. Eye exam is current.  Hypertension This is a chronic problem. The current episode started more than 1 year ago. The problem is controlled. Associated symptoms include headaches. Pertinent negatives include no blurred vision, chest pain, palpitations or sweats. The current treatment provides moderate improvement. Compliance problems include exercise.  Hypertensive end-organ damage includes kidney disease.     Past Medical History:  Diagnosis Date  . Arthritis   . Bowel obstruction ([place])   . Bronchitis   . CHF (congestive heart failure) ([place])   . Chronic systolic dysfunction of left ventricle   . Diabetes mellitus without complication (HCC)    on oral meds  . GERD (gastroesophageal reflux disease)   . Headache(784.0)    migraines, hasn't had one for over a year  . History of noncompliance with medical treatment   . Hypercholesteremia   . Hypertension   . LVH (left ventricular hypertrophy)   . Nonischemic cardiomyopathy ([place])   . Polysubstance abuse ([place])   . Tobacco abuse disorder      Family History   Problem Relation Age of Onset  . Diabetes Mother   . Hypertension Mother   . Heart disease Mother   . Heart disease Father   . Diabetes Father   . Hypertension Father   . Breast cancer Neg Hx      Current Outpatient Medications:  .  aspirin 81 MG EC tablet, Take 81 mg by mouth daily. Swallow whole., Disp: , Rfl:  .  atorvastatin (LIPITOR) 40 MG tablet, Take 1 tablet (40 mg total) by mouth daily at 6 PM., Disp: 90 tablet, Rfl: 2 .  baclofen (LIORESAL) 10 MG tablet, TAKE 1 TABLET BY MOUTH TWO  TIMES DAILY AS NEEDED, Disp: 90 tablet, Rfl: 1 .  carvedilol (COREG) 25 MG tablet, Take 1 tablet (25 mg total) by mouth 2 (two) times daily with a meal., Disp: 60 tablet, Rfl: 2 .  Cholecalciferol (VITAMIN D3) 2000 units TABS, Take 2,000 Units/day by mouth every morning., Disp: , Rfl:  .  dapagliflozin propanediol (FARXIGA) 5 MG TABS tablet, Take 5 mg by mouth daily., Disp: 30 tablet, Rfl: 5 .  folic acid (FOLVITE) 1 MG tablet, Take 1 tablet (1 mg total) by mouth daily., Disp: 90 tablet, Rfl: 1 .  lisinopril (PRINIVIL,ZESTRIL) 20 MG tablet, Take 1 tablet (20 mg total) by mouth daily. (Patient taking differently: Take 20 mg by mouth 2 (two) times daily. ), Disp: 90 tablet, Rfl: 2 .  magnesium oxide (MAG-OX) 400 (241.3 Mg) MG tablet, Take 1 tablet (400 mg total) by mouth daily., Disp: 90 tablet, Rfl: 1 .  meloxicam (MOBIC) 15 MG tablet, Take 15 mg by mouth daily.,  Disp: , Rfl:  .  Nutritional Supplements (L-GLUTAMINE/CHOLINE/INOSITOL PO), Take by mouth., Disp: , Rfl:  .  omeprazole (PRILOSEC) 40 MG capsule, Take 40 mg by mouth daily., Disp: , Rfl:  .  pyridOXINE (VITAMIN B-6) 100 MG tablet, Take 100 mg by mouth daily., Disp: , Rfl:  .  spironolactone (ALDACTONE) 25 MG tablet, Take 25 mg by mouth daily., Disp: , Rfl:  .  CHOLINE-INOSITOL-METHIONINE IM, Inject into the muscle., Disp: , Rfl:  .  gabapentin (NEURONTIN) 300 MG capsule, Take 300 mg by mouth 2 (two) times daily., Disp: , Rfl:  .  LORazepam  (ATIVAN) 0.5 MG tablet, 1 tab po q 4-6 hours prn or 1 tab po 30 minutes prior to radiation, Disp: 30 tablet, Rfl: 0   Allergies  Allergen Reactions  . Codeine Nausea And Vomiting     Review of Systems  Constitutional: Negative.   Eyes: Negative for blurred vision.  Respiratory: Negative.   Cardiovascular: Negative.  Negative for chest pain and palpitations.  Gastrointestinal: Negative.   Neurological: Positive for headaches.       She reports she has been having frontal headaches. She is having MRI later this week. Described as throbbing, radiates to left side of head. No associated visual disturbance or weakness.   Psychiatric/Behavioral: Negative.      Today's Vitals   05/22/19 1030  BP: 110/70  Pulse: 73  Temp: 98 F (36.7 C)  TempSrc: Oral  SpO2: 100%  Weight: 137 lb (62.1 kg)  Height: 5' 2.8" (1.595 m)   Body mass index is 24.42 kg/m.   Objective:  Physical Exam Vitals signs and nursing note reviewed.  Constitutional:      Appearance: Normal appearance.  HENT:     Head: Normocephalic and atraumatic.  Cardiovascular:     Rate and Rhythm: Normal rate and regular rhythm.     Pulses:          Dorsalis pedis pulses are 2+ on the right side and 2+ on the left side.     Heart sounds: Normal heart sounds.  Pulmonary:     Effort: Pulmonary effort is normal.     Breath sounds: Normal breath sounds.  Feet:     Right foot:     Protective Sensation: 5 sites tested. 5 sites sensed.     Skin integrity: Skin integrity normal.     Left foot:     Protective Sensation: 5 sites tested. 5 sites sensed.     Skin integrity: Skin integrity normal.     Comments: She has b/l bunion. B/L short 4th toe Skin:    General: Skin is warm.  Neurological:     General: No focal deficit present.     Mental Status: She is alert.  Psychiatric:        Mood and Affect: Mood normal.        Behavior: Behavior normal.         Assessment And Plan:     1. Type 2 diabetes mellitus with  stage 2 chronic kidney disease, without long-term current use of insulin ([place])  Diabetic foot exam was performed today.  I will check labs as listed below. I will also forward a copy of her labs to Dr. Terrence Dupont for his review. She will rto in 3-4 months for re-evaluation.   - Lipid panel - Hemoglobin A1c  2. Heart & renal disease, hypertensive benign, heart fail/chr renal dis ([place])  Well controlled. She will continue with current meds. She is  encouraged to avoid adding salt to her foods.   3. Chronic systolic dysfunction of left ventricle  Chronic, yet stable. She is encouraged to limit her salt intake.   4. Nonintractable episodic headache, unspecified headache type  Again, she has upcoming MRI brain. Will make further recommendations once her results are available for review.   5. Drug therapy  - Methylmalonic Acid        Maximino Greenland, MD    THE PATIENT IS ENCOURAGED TO PRACTICE SOCIAL DISTANCING DUE TO THE COVID-19 PANDEMIC.

## 2019-05-22 NOTE — Telephone Encounter (Signed)
Called to let patient know that the Ativan she requested for her 7/22 MRI has been called into the [facility].  Ms. Derrick was very appreciative and understands that she will need a driver when taking this medication.   Mont Dutton R.T.(R)(T) Special Procedures Navigator

## 2019-05-22 NOTE — Patient Instructions (Signed)

## 2019-05-23 ENCOUNTER — Ambulatory Visit
Admission: RE | Admit: 2019-05-23 | Discharge: 2019-05-23 | Disposition: A | Discharge status: Home / Self Care | Payer: Medicare Other | Source: Ambulatory Visit | Attending: Radiation Oncology | Admitting: Radiation Oncology

## 2019-05-23 DIAGNOSIS — D333 Benign neoplasm of cranial nerves: Secondary | ICD-10-CM

## 2019-05-23 MED ORDER — GADOBENATE DIMEGLUMINE 529 MG/ML IV SOLN
10.0000 mL | Freq: Once | INTRAVENOUS | Status: AC | PRN
  Administered 2019-05-23: 10 mL via INTRAVENOUS

## 2019-05-24 ENCOUNTER — Ambulatory Visit: Payer: Medicare Other | Admitting: Radiation Oncology

## 2019-05-24 LAB — METHYLMALONIC ACID, SERUM: Methylmalonic Acid: 118 nmol/L (ref 0–378)

## 2019-05-24 LAB — LIPID PANEL
Chol/HDL Ratio: 3.1 ratio (ref 0.0–4.4)
Cholesterol, Total: 163 mg/dL (ref 100–199)
HDL: 52 mg/dL (ref >39)
LDL Calculated: 82 mg/dL (ref 0–99)
Triglycerides: 146 mg/dL (ref 0–149)
VLDL Cholesterol Cal: 29 mg/dL (ref 5–40)

## 2019-05-24 LAB — HEMOGLOBIN A1C
Est. average glucose Bld gHb Est-mCnc: 134 mg/dL
Hgb A1c MFr Bld: 6.3 % — ABNORMAL HIGH (ref 4.8–5.6)

## 2019-05-28 ENCOUNTER — Ambulatory Visit
Admission: RE | Admit: 2019-05-28 | Discharge: 2019-05-28 | Disposition: A | Discharge status: Home / Self Care | Payer: Medicare Other | Source: Ambulatory Visit | Attending: Radiation Oncology | Admitting: Radiation Oncology

## 2019-05-28 ENCOUNTER — Other Ambulatory Visit: Payer: Self-pay

## 2019-05-28 DIAGNOSIS — D333 Benign neoplasm of cranial nerves: Secondary | ICD-10-CM

## 2019-05-28 NOTE — Progress Notes (Signed)
Radiation Oncology         [phone] ________________________________  Outpatient Follow Up - Conducted via telephone due to current COVID-19 concerns for limiting patient exposure  I spoke with the patient to conduct this consult visit via telephone to spare the patient unnecessary potential exposure in the healthcare setting during the current COVID-19 pandemic. The patient was notified in advance and was offered a [place] meeting to allow for face to face communication but unfortunately reported that they did not have the appropriate resources/technology to support such a visit and instead preferred to proceed with a telephone visit.    ________________________________  Initial Outpatient Consultation - Conducted via telephone due to current COVID-19 concerns for limiting patient exposure  I spoke with the patient to conduct this consult visit via telephone to spare the patient unnecessary potential exposure in the healthcare setting during the current COVID-19 pandemic. The patient was notified in advance and was offered a [place] meeting to allow for face to face communication but unfortunately reported that they did not have the appropriate resources/technology to support such a visit and instead preferred to proceed with a telephone consult.   ________________________________  Name: [name] MRN: [id number]  Date: 05/28/2019  DOB: 30-Jun-1961  Follow-Up Visit Note  CC: Glendale Chard, MD  Glendale Chard, MD  Diagnosis:  Left  Acoustic Neuroma  Narrative:  Ms. Juhasz is a pleasant 58 y.o. woman with a history of a left acoustic neuroma. She was originally found to have this process after undergoing an unrelated surgery for her left foot in 2012, and had trouble with complications following this surgery which prompted an evaluation with neurology. It is unclear what prompted a brain scan, however this was ordered and revealed a 4 x 5 mm enhancing mass in the left internal  auditory canal. She was offered radiosurgery, though has elected to forgo any treatment. She's been followed with stability of this on serial imaging. She had an MRI of the brain on  03/15/18 which again showed stability of this lesion. She is contacted by telephone to discuss her most recent scan on 05/23/2019 which again showed stability of the neuroma.   On review of systems, the patient reports that she is doing well overall. She recently quit drinking after having a terrible headache. She was drinking 3-5 shots of tequila a day and reports that she is not having headaches any longer. This is what prompted her to return for her MRI. She also reports her vision has been good, but it was formally checked with her  opthamologist as well. She denies any tinnitits, or changes in her hearing. She denies any chest pain, shortness of breath, cough, fevers, chills, night sweats, unintended weight changes. She denies any bowel or bladder disturbances, and denies abdominal pain, nausea or vomiting. She denies any new musculoskeletal or joint aches or pains, new skin lesions or concerns. A complete review of systems is obtained and is otherwise negative.   Prior Radiation Treatments: None  Past Medical History:  Past Medical History:  Diagnosis Date   Arthritis    Bowel obstruction (HCC)    Bronchitis    CHF (congestive heart failure) (HCC)    Chronic systolic dysfunction of left ventricle    Diabetes mellitus without complication (HCC)    on oral meds   GERD (gastroesophageal reflux disease)    Headache(784.0)    migraines, hasn't had one for over a year   History of noncompliance with medical treatment  Hypercholesteremia    Hypertension    LVH (left ventricular hypertrophy)    Nonischemic cardiomyopathy (HCC)    Polysubstance abuse ([place])    Tobacco abuse disorder     Past Surgical History: Past Surgical History:  Procedure Laterality Date   ABDOMINAL HYSTERECTOMY      ABDOMINAL SURGERY     left foot surgery     STRABISMUS SURGERY Left 07/31/2013   Procedure: REPAIR STRABISMUS;  Surgeon: Derry Skill, MD;  Location: [facility];  Service: Ophthalmology;  Laterality: Left;  EYE MUSCLE SURGERY LEFT EYE   UTERINE FIBROID SURGERY      Social History:  Social History   Socioeconomic History   Marital status: Single    Spouse name: Not on file   Number of children: 0   Years of education: 12   Highest education level: Not on file  Occupational History   Occupation: N/A  Social Designer, fashion/clothing strain: Not on file   Food insecurity    Worry: Never true    Inability: Never true   Transportation needs    Medical: No    Non-medical: No  Tobacco Use   Smoking status: Former Smoker    Packs/day: 0.50    Years: 30.00    Pack years: 15.00    Quit date: 07/02/2012    Years since quitting: 6.9   Smokeless tobacco: Never Used   Tobacco comment: she is not ready to quit  Substance and Sexual Activity   Alcohol use: Yes    Alcohol/week: 0.0 standard drinks    Comment: occassionally   Drug use: Yes    Types: Marijuana   Sexual activity: Not Currently  Lifestyle   Physical activity    Days per week: 2 days    Minutes per session: 60 min   Stress: Not at all  Relationships   Social connections    Talks on phone: Not on file    Gets together: Not on file    Attends religious service: Not on file    Active member of club or organization: Not on file    Attends meetings of clubs or organizations: Not on file    Relationship status: Not on file   Intimate partner violence    Fear of current or ex partner: No    Emotionally abused: No    Physically abused: No    Forced sexual activity: No  Other Topics Concern   Not on file  Social History Narrative   Lives in [place]   Disabled   Occasional caffeine use     Family History: Family History  Problem Relation Age of Onset   Diabetes Mother    Hypertension  Mother    Heart disease Mother    Heart disease Father    Diabetes Father    Hypertension Father    Breast cancer Neg Hx      ALLERGIES:  is allergic to codeine.  Meds: Current Outpatient Medications  Medication Sig Dispense Refill   aspirin 81 MG EC tablet Take 81 mg by mouth daily. Swallow whole.     atorvastatin (LIPITOR) 40 MG tablet Take 1 tablet (40 mg total) by mouth daily at 6 PM. 90 tablet 2   baclofen (LIORESAL) 10 MG tablet TAKE 1 TABLET BY MOUTH TWO  TIMES DAILY AS NEEDED 90 tablet 1   carvedilol (COREG) 25 MG tablet Take 1 tablet (25 mg total) by mouth 2 (two) times daily with a meal. 60 tablet 2  Cholecalciferol (VITAMIN D3) 2000 units TABS Take 2,000 Units/day by mouth every morning.     CHOLINE-INOSITOL-METHIONINE IM Inject into the muscle.     dapagliflozin propanediol (FARXIGA) 5 MG TABS tablet Take 5 mg by mouth daily. 30 tablet 5   folic acid (FOLVITE) 1 MG tablet Take 1 tablet (1 mg total) by mouth daily. 90 tablet 1   gabapentin (NEURONTIN) 300 MG capsule Take 300 mg by mouth 2 (two) times daily.     lisinopril (PRINIVIL,ZESTRIL) 20 MG tablet Take 1 tablet (20 mg total) by mouth daily. (Patient taking differently: Take 20 mg by mouth 2 (two) times daily. ) 90 tablet 2   LORazepam (ATIVAN) 0.5 MG tablet 1 tab po q 4-6 hours prn or 1 tab po 30 minutes prior to radiation 30 tablet 0   magnesium oxide (MAG-OX) 400 (241.3 Mg) MG tablet Take 1 tablet (400 mg total) by mouth daily. 90 tablet 1   meloxicam (MOBIC) 15 MG tablet Take 15 mg by mouth daily.     Nutritional Supplements (L-GLUTAMINE/CHOLINE/INOSITOL PO) Take by mouth.     omeprazole (PRILOSEC) 40 MG capsule Take 40 mg by mouth daily.     pyridOXINE (VITAMIN B-6) 100 MG tablet Take 100 mg by mouth daily.     spironolactone (ALDACTONE) 25 MG tablet Take 25 mg by mouth daily.     No current facility-administered medications for this encounter.     Physical Findings: Unable to assess due  to encounter type  Lab Findings: Lab Results  Component Value Date   WBC 8.4 10/16/2018   HGB 10.8 (L) 10/16/2018   HCT 33.7 (L) 10/16/2018   MCV 88 10/16/2018   PLT 256 10/16/2018     Radiographic Findings: Mr Jeri Cos [facility] Contrast  Result Date: 05/23/2019 CLINICAL DATA:  Follow-up left vestibular schwannoma. EXAM: MRI HEAD WITHOUT AND WITH CONTRAST TECHNIQUE: Multiplanar, multiecho pulse sequences of the brain and surrounding structures were obtained without and with intravenous contrast. CONTRAST:  57mL MULTIHANCE GADOBENATE DIMEGLUMINE 529 MG/ML IV SOLN COMPARISON:  MRI head with contrast 03/15/2018 FINDINGS: Brain: IAC protocol utilized. Enhancing mass within the left internal auditory canal measures 5 x 3 mm compatible with vestibular schwannoma. No change in size from the prior study. No significant extension into the cistern. Right IAC normal. Brainstem and cerebellum normal. Basilar cisterns normal. Ventricle size normal. No acute infarct. Scattered small deep white matter hyperintensities unchanged from the prior study. Vascular: Normal arterial flow void Skull and upper cervical spine: No suspicious skull lesion. Sinuses/Orbits: Negative Other: None IMPRESSION: 5 x 3 mm enhancing mass left internal auditory canal unchanged compatible with vestibular schwannoma. No extension into the cistern. Mild chronic white matter changes stable. Electronically Signed   By: Franchot Gallo M.D.   On: 05/23/2019 21:10    Impression/Plan: 1. Left Acoustic Neuroma. The patient appears to be doing better clinically and stable radiographically. Recommendations are to continue with annual MRIs to follow her neuroma. She continues to follow with Dr. Redmond Baseman every other year, and requests that we continue on with annual visits. We will coordinate this for her moving forward.    Given current concerns for patient exposure during the COVID-19 pandemic, this encounter was conducted via telephone.  The patient  has given verbal consent for this type of encounter. The time spent during this encounter was 25 minutes and 50% of that time was spent in the coordination of her care. The attendants for this meeting include Shona Simpson, [facility] and Olin Hauser  D Fiero  During the encounter, Shona Simpson [facility] was located at home remotely. SHALISSA EASTERWOOD  was located at home.    Carola Rhine, PAC

## 2019-06-04 ENCOUNTER — Telehealth: Payer: Self-pay | Admitting: Internal Medicine

## 2019-06-04 NOTE — Telephone Encounter (Signed)
PT LVM THAT SHE HAS TO TRANSFER CARE DUE TO HER INSURANCE HAS NOW SWITCHED TO [employer] AND WE ARE OUT OF NETWORK

## 2019-06-05 ENCOUNTER — Other Ambulatory Visit: Payer: Self-pay | Admitting: *Deleted

## 2019-06-05 DIAGNOSIS — Z20822 Contact with and (suspected) exposure to covid-19: Secondary | ICD-10-CM

## 2019-06-07 LAB — NOVEL CORONAVIRUS, NAA: SARS-CoV-2, NAA: NOT DETECTED

## 2019-06-12 ENCOUNTER — Other Ambulatory Visit: Payer: Self-pay

## 2019-06-12 DIAGNOSIS — I10 Essential (primary) hypertension: Secondary | ICD-10-CM

## 2019-06-12 MED ORDER — FARXIGA 5 MG PO TABS: 5.0000 mg | ORAL_TABLET | Freq: Every day | ORAL | 0 refills | Status: DC

## 2019-06-18 ENCOUNTER — Other Ambulatory Visit: Payer: Self-pay

## 2019-06-18 ENCOUNTER — Other Ambulatory Visit: Payer: Self-pay | Admitting: Obstetrics and Gynecology

## 2019-06-18 ENCOUNTER — Ambulatory Visit (INDEPENDENT_AMBULATORY_CARE_PROVIDER_SITE_OTHER): Payer: Medicare Other | Admitting: Obstetrics and Gynecology

## 2019-06-18 ENCOUNTER — Encounter: Payer: Self-pay | Admitting: Obstetrics and Gynecology

## 2019-06-18 VITALS — BP 124/85 | HR 54 | Temp 97.0°F | Ht 63.0 in | Wt 136.0 lb

## 2019-06-18 DIAGNOSIS — Z1231 Encounter for screening mammogram for malignant neoplasm of breast: Secondary | ICD-10-CM

## 2019-06-18 DIAGNOSIS — Z01419 Encounter for gynecological examination (general) (routine) without abnormal findings: Secondary | ICD-10-CM | POA: Diagnosis not present

## 2019-06-18 DIAGNOSIS — E2839 Other primary ovarian failure: Secondary | ICD-10-CM

## 2019-06-18 NOTE — Progress Notes (Signed)
Subjective:     [name] is a 58 y.o. female with BMI s/p hysterectomy who is here for a comprehensive physical exam. The patient reports no problems. She is not sexually active. She denies pelvic pain, abnormal discharge or urinary incontinence. Patient reports a normal colonoscopy less than 10 years ago.  Past Medical History:  Diagnosis Date  . Arthritis   . Bowel obstruction ([place])   . Bronchitis   . CHF (congestive heart failure) ([place])   . Chronic systolic dysfunction of left ventricle   . Diabetes mellitus without complication (HCC)    on oral meds  . GERD (gastroesophageal reflux disease)   . Headache(784.0)    migraines, hasn't had one for over a year  . History of noncompliance with medical treatment   . Hypercholesteremia   . Hypertension   . LVH (left ventricular hypertrophy)   . Nonischemic cardiomyopathy ([place])   . Polysubstance abuse ([place])   . Tobacco abuse disorder    Past Surgical History:  Procedure Laterality Date  . ABDOMINAL HYSTERECTOMY    . ABDOMINAL SURGERY    . left foot surgery    . STRABISMUS SURGERY Left 07/31/2013   Procedure: REPAIR STRABISMUS;  Surgeon: Derry Skill, MD;  Location: [facility];  Service: Ophthalmology;  Laterality: Left;  EYE MUSCLE SURGERY LEFT EYE  . UTERINE FIBROID SURGERY     Family History  Problem Relation Age of Onset  . Diabetes Mother   . Hypertension Mother   . Heart disease Mother   . Heart disease Father   . Diabetes Father   . Hypertension Father   . Breast cancer Neg Hx     Social History   Socioeconomic History  . Marital status: Single    Spouse name: Not on file  . Number of children: 0  . Years of education: 44  . Highest education level: Not on file  Occupational History  . Occupation: N/A  Social Needs  . Financial resource strain: Not on file  . Food insecurity    Worry: Never true    Inability: Never true  . Transportation needs    Medical: No    Non-medical: No  Tobacco Use  .  Smoking status: Former Smoker    Packs/day: 0.50    Years: 30.00    Pack years: 15.00    Quit date: 07/02/2012    Years since quitting: 6.9  . Smokeless tobacco: Never Used  . Tobacco comment: she is not ready to quit  Substance and Sexual Activity  . Alcohol use: Yes    Alcohol/week: 0.0 standard drinks    Comment: occassionally  . Drug use: Yes    Types: Marijuana  . Sexual activity: Not Currently    Birth control/protection: Surgical  Lifestyle  . Physical activity    Days per week: 2 days    Minutes per session: 60 min  . Stress: Not at all  Relationships  . Social Herbalist on phone: Not on file    Gets together: Not on file    Attends religious service: Not on file    Active member of club or organization: Not on file    Attends meetings of clubs or organizations: Not on file    Relationship status: Not on file  . Intimate partner violence    Fear of current or ex partner: No    Emotionally abused: No    Physically abused: No    Forced sexual activity: No  Other Topics Concern  . Not on file  Social History Narrative   Lives in [place]   Disabled   Occasional caffeine use    Health Maintenance  Topic Date Due  . PAP SMEAR-Modifier  12/21/1979  . INFLUENZA VACCINE  06/02/2019  . HEMOGLOBIN A1C  11/22/2019  . OPHTHALMOLOGY EXAM  12/29/2019  . FOOT EXAM  05/21/2020  . MAMMOGRAM  10/06/2020  . COLONOSCOPY  12/08/2023  . TETANUS/TDAP  04/07/2028  . PNEUMOCOCCAL POLYSACCHARIDE VACCINE AGE 39-64 HIGH RISK  Completed  . Hepatitis C Screening  Completed  . HIV Screening  Completed       Review of Systems Pertinent items are noted in HPI.   Objective:  Blood pressure 124/85, pulse (!) 54, temperature (!) 97 F (36.1 C), height 5\' 3"  (1.6 m), weight 136 lb (61.7 kg).     GENERAL: Well-developed, well-nourished female in no acute distress.  HEENT: Normocephalic, atraumatic. Sclerae anicteric.  NECK: Supple. Normal thyroid.  LUNGS: Clear to  auscultation bilaterally.  HEART: Regular rate and rhythm. BREASTS: Symmetric in size. No palpable masses or lymphadenopathy, skin changes, or nipple drainage. ABDOMEN: Soft, nontender, nondistended. No organomegaly. PELVIC: Normal external female genitalia. Vagina is pale pink.  Normal discharge. Normal appearing vaginal vault. No adnexal mass or tenderness. EXTREMITIES: No cyanosis, clubbing, or edema, 2+ distal pulses.    Assessment:    Healthy female exam.      Plan:    Patient due for screening mammogram 10/2019 Dexa scan ordered Patient will be contacted with abnormal results See After Visit Summary for Counseling Recommendations

## 2019-06-18 NOTE — Progress Notes (Signed)
New GYN, presents for AEX/PAP.  Last Mammogram 10/06/2018.  Reports no problems today.

## 2019-06-20 LAB — URINE CULTURE

## 2019-06-21 MED ORDER — NITROFURANTOIN MONOHYD MACRO 100 MG PO CAPS: 100.0000 mg | ORAL_CAPSULE | Freq: Two times a day (BID) | ORAL | 1 refills | Status: DC

## 2019-06-21 NOTE — Addendum Note (Signed)
Addended by: Mora Bellman on: 06/21/2019 11:02 AM   Modules accepted: Orders

## 2019-08-02 ENCOUNTER — Encounter: Payer: Medicare Other | Admitting: Internal Medicine

## 2019-08-02 ENCOUNTER — Ambulatory Visit: Payer: Medicare Other

## 2019-09-05 ENCOUNTER — Other Ambulatory Visit: Payer: Self-pay | Admitting: Internal Medicine

## 2019-09-05 DIAGNOSIS — I10 Essential (primary) hypertension: Secondary | ICD-10-CM

## 2019-09-14 ENCOUNTER — Other Ambulatory Visit: Payer: Self-pay

## 2019-09-14 DIAGNOSIS — Z20822 Contact with and (suspected) exposure to covid-19: Secondary | ICD-10-CM

## 2019-09-17 LAB — NOVEL CORONAVIRUS, NAA: SARS-CoV-2, NAA: NOT DETECTED

## 2019-10-08 ENCOUNTER — Ambulatory Visit
Admission: RE | Admit: 2019-10-08 | Discharge: 2019-10-08 | Disposition: A | Discharge status: Home / Self Care | Payer: Medicare Other | Source: Ambulatory Visit | Attending: Obstetrics and Gynecology | Admitting: Obstetrics and Gynecology

## 2019-10-08 ENCOUNTER — Other Ambulatory Visit: Payer: Self-pay

## 2019-10-08 ENCOUNTER — Other Ambulatory Visit: Payer: Medicare Other

## 2019-10-08 DIAGNOSIS — Z1231 Encounter for screening mammogram for malignant neoplasm of breast: Secondary | ICD-10-CM

## 2019-10-08 DIAGNOSIS — E2839 Other primary ovarian failure: Secondary | ICD-10-CM

## 2019-10-29 ENCOUNTER — Ambulatory Visit: Payer: Medicare Other | Attending: Internal Medicine

## 2019-10-29 DIAGNOSIS — Z20822 Contact with and (suspected) exposure to covid-19: Secondary | ICD-10-CM

## 2019-10-31 LAB — NOVEL CORONAVIRUS, NAA: SARS-CoV-2, NAA: NOT DETECTED

## 2019-11-25 ENCOUNTER — Other Ambulatory Visit: Payer: Self-pay | Admitting: Internal Medicine

## 2019-12-18 ENCOUNTER — Other Ambulatory Visit: Payer: Self-pay | Admitting: Internal Medicine

## 2020-02-20 ENCOUNTER — Other Ambulatory Visit: Payer: Self-pay | Admitting: Internal Medicine

## 2020-04-29 ENCOUNTER — Ambulatory Visit: Payer: Medicare Other

## 2020-04-29 ENCOUNTER — Ambulatory Visit: Payer: Medicare Other | Admitting: Internal Medicine

## 2020-07-02 ENCOUNTER — Other Ambulatory Visit: Payer: Self-pay | Admitting: Orthopedic Surgery

## 2020-07-02 DIAGNOSIS — M25512 Pain in left shoulder: Secondary | ICD-10-CM

## 2020-07-26 ENCOUNTER — Ambulatory Visit
Admission: RE | Admit: 2020-07-26 | Discharge: 2020-07-26 | Disposition: A | Discharge status: Home / Self Care | Payer: Medicare Other | Source: Ambulatory Visit | Attending: Orthopedic Surgery | Admitting: Orthopedic Surgery

## 2020-07-26 DIAGNOSIS — M25512 Pain in left shoulder: Secondary | ICD-10-CM

## 2020-07-29 ENCOUNTER — Other Ambulatory Visit: Payer: Medicare Other

## 2020-07-29 DIAGNOSIS — Z20822 Contact with and (suspected) exposure to covid-19: Secondary | ICD-10-CM

## 2020-07-30 LAB — NOVEL CORONAVIRUS, NAA: SARS-CoV-2, NAA: NOT DETECTED

## 2020-07-30 LAB — SARS-COV-2, NAA 2 DAY TAT

## 2020-08-19 ENCOUNTER — Ambulatory Visit: Payer: Medicare Other | Attending: Internal Medicine

## 2020-08-19 DIAGNOSIS — Z23 Encounter for immunization: Secondary | ICD-10-CM

## 2020-08-19 NOTE — Progress Notes (Signed)
   Covid-19 Vaccination Clinic  Name:  [name]    MRN: [id number] DOB: 1961/06/19  08/19/2020  [name] was observed post Covid-19 immunization for 15 minutes without incident. She was provided with Vaccine Information Sheet and instruction to access the V-Safe system.   [name] was instructed to call 911 with any severe reactions post vaccine: Marland Kitchen Difficulty breathing  . Swelling of face and throat  . A fast heartbeat  . A bad rash all over body  . Dizziness and weakness

## 2020-08-26 ENCOUNTER — Other Ambulatory Visit: Payer: Medicare Other

## 2020-08-26 DIAGNOSIS — Z20822 Contact with and (suspected) exposure to covid-19: Secondary | ICD-10-CM

## 2020-08-27 LAB — NOVEL CORONAVIRUS, NAA: SARS-CoV-2, NAA: NOT DETECTED

## 2020-08-27 LAB — SARS-COV-2, NAA 2 DAY TAT

## 2020-09-05 ENCOUNTER — Other Ambulatory Visit: Payer: Self-pay

## 2020-09-05 ENCOUNTER — Other Ambulatory Visit: Payer: Self-pay | Admitting: Family Medicine

## 2020-09-05 ENCOUNTER — Ambulatory Visit
Admission: RE | Admit: 2020-09-05 | Discharge: 2020-09-05 | Disposition: A | Discharge status: Home / Self Care | Payer: Medicare Other | Source: Ambulatory Visit | Attending: Family Medicine | Admitting: Family Medicine

## 2020-09-05 DIAGNOSIS — M25559 Pain in unspecified hip: Secondary | ICD-10-CM

## 2020-11-25 DIAGNOSIS — E782 Mixed hyperlipidemia: Secondary | ICD-10-CM | POA: Diagnosis not present

## 2020-11-25 DIAGNOSIS — E559 Vitamin D deficiency, unspecified: Secondary | ICD-10-CM | POA: Diagnosis not present

## 2020-11-25 DIAGNOSIS — M130 Polyarthritis, unspecified: Secondary | ICD-10-CM | POA: Diagnosis not present

## 2020-11-25 DIAGNOSIS — E538 Deficiency of other specified B group vitamins: Secondary | ICD-10-CM | POA: Diagnosis not present

## 2020-11-25 DIAGNOSIS — I10 Essential (primary) hypertension: Secondary | ICD-10-CM | POA: Diagnosis not present

## 2020-11-25 DIAGNOSIS — R69 Illness, unspecified: Secondary | ICD-10-CM | POA: Diagnosis not present

## 2020-11-25 DIAGNOSIS — E1169 Type 2 diabetes mellitus with other specified complication: Secondary | ICD-10-CM | POA: Diagnosis not present

## 2020-11-25 DIAGNOSIS — J440 Chronic obstructive pulmonary disease with acute lower respiratory infection: Secondary | ICD-10-CM | POA: Diagnosis not present

## 2020-11-26 ENCOUNTER — Other Ambulatory Visit: Payer: Self-pay | Admitting: Family Medicine

## 2020-11-26 DIAGNOSIS — Z1231 Encounter for screening mammogram for malignant neoplasm of breast: Secondary | ICD-10-CM

## 2020-11-27 DIAGNOSIS — M1712 Unilateral primary osteoarthritis, left knee: Secondary | ICD-10-CM | POA: Diagnosis not present

## 2020-12-01 DIAGNOSIS — M2021 Hallux rigidus, right foot: Secondary | ICD-10-CM | POA: Diagnosis not present

## 2020-12-01 DIAGNOSIS — T8484XA Pain due to internal orthopedic prosthetic devices, implants and grafts, initial encounter: Secondary | ICD-10-CM | POA: Diagnosis not present

## 2020-12-01 DIAGNOSIS — S92404K Nondisplaced unspecified fracture of right great toe, subsequent encounter for fracture with nonunion: Secondary | ICD-10-CM | POA: Diagnosis not present

## 2020-12-01 DIAGNOSIS — L84 Corns and callosities: Secondary | ICD-10-CM | POA: Diagnosis not present

## 2020-12-11 DIAGNOSIS — E119 Type 2 diabetes mellitus without complications: Secondary | ICD-10-CM | POA: Diagnosis not present

## 2020-12-11 DIAGNOSIS — H25813 Combined forms of age-related cataract, bilateral: Secondary | ICD-10-CM | POA: Diagnosis not present

## 2020-12-11 DIAGNOSIS — H40012 Open angle with borderline findings, low risk, left eye: Secondary | ICD-10-CM | POA: Diagnosis not present

## 2020-12-11 DIAGNOSIS — H04123 Dry eye syndrome of bilateral lacrimal glands: Secondary | ICD-10-CM | POA: Diagnosis not present

## 2020-12-11 DIAGNOSIS — H10413 Chronic giant papillary conjunctivitis, bilateral: Secondary | ICD-10-CM | POA: Diagnosis not present

## 2020-12-11 DIAGNOSIS — H31012 Macula scars of posterior pole (postinflammatory) (post-traumatic), left eye: Secondary | ICD-10-CM | POA: Diagnosis not present

## 2020-12-11 DIAGNOSIS — H35342 Macular cyst, hole, or pseudohole, left eye: Secondary | ICD-10-CM | POA: Diagnosis not present

## 2020-12-19 DIAGNOSIS — I420 Dilated cardiomyopathy: Secondary | ICD-10-CM | POA: Diagnosis not present

## 2020-12-19 DIAGNOSIS — E119 Type 2 diabetes mellitus without complications: Secondary | ICD-10-CM | POA: Diagnosis not present

## 2020-12-19 DIAGNOSIS — E785 Hyperlipidemia, unspecified: Secondary | ICD-10-CM | POA: Diagnosis not present

## 2020-12-19 DIAGNOSIS — I5020 Unspecified systolic (congestive) heart failure: Secondary | ICD-10-CM | POA: Diagnosis not present

## 2020-12-19 DIAGNOSIS — I10 Essential (primary) hypertension: Secondary | ICD-10-CM | POA: Diagnosis not present

## 2021-01-01 DIAGNOSIS — R69 Illness, unspecified: Secondary | ICD-10-CM | POA: Diagnosis not present

## 2021-01-01 DIAGNOSIS — E1169 Type 2 diabetes mellitus with other specified complication: Secondary | ICD-10-CM | POA: Diagnosis not present

## 2021-01-01 DIAGNOSIS — M130 Polyarthritis, unspecified: Secondary | ICD-10-CM | POA: Diagnosis not present

## 2021-01-09 DIAGNOSIS — S92404K Nondisplaced unspecified fracture of right great toe, subsequent encounter for fracture with nonunion: Secondary | ICD-10-CM | POA: Diagnosis not present

## 2021-01-09 DIAGNOSIS — M2021 Hallux rigidus, right foot: Secondary | ICD-10-CM | POA: Diagnosis not present

## 2021-01-09 DIAGNOSIS — T8484XA Pain due to internal orthopedic prosthetic devices, implants and grafts, initial encounter: Secondary | ICD-10-CM | POA: Diagnosis not present

## 2021-01-16 ENCOUNTER — Ambulatory Visit: Payer: Medicare Other

## 2021-01-20 DIAGNOSIS — R69 Illness, unspecified: Secondary | ICD-10-CM | POA: Diagnosis not present

## 2021-01-20 DIAGNOSIS — E1169 Type 2 diabetes mellitus with other specified complication: Secondary | ICD-10-CM | POA: Diagnosis not present

## 2021-01-20 DIAGNOSIS — I10 Essential (primary) hypertension: Secondary | ICD-10-CM | POA: Diagnosis not present

## 2021-02-27 DIAGNOSIS — R69 Illness, unspecified: Secondary | ICD-10-CM | POA: Diagnosis not present

## 2021-02-27 DIAGNOSIS — E782 Mixed hyperlipidemia: Secondary | ICD-10-CM | POA: Diagnosis not present

## 2021-02-27 DIAGNOSIS — I10 Essential (primary) hypertension: Secondary | ICD-10-CM | POA: Diagnosis not present

## 2021-02-27 DIAGNOSIS — J441 Chronic obstructive pulmonary disease with (acute) exacerbation: Secondary | ICD-10-CM | POA: Diagnosis not present

## 2021-03-03 ENCOUNTER — Other Ambulatory Visit (HOSPITAL_COMMUNITY)
Admission: RE | Admit: 2021-03-03 | Discharge: 2021-03-03 | Disposition: A | Discharge status: Home / Self Care | Payer: Medicare HMO | Source: Ambulatory Visit | Attending: Family Medicine | Admitting: Family Medicine

## 2021-03-03 ENCOUNTER — Other Ambulatory Visit: Payer: Self-pay | Admitting: Family Medicine

## 2021-03-03 DIAGNOSIS — T190XXA Foreign body in urethra, initial encounter: Secondary | ICD-10-CM | POA: Diagnosis not present

## 2021-03-03 DIAGNOSIS — T1490XA Injury, unspecified, initial encounter: Secondary | ICD-10-CM | POA: Diagnosis not present

## 2021-03-03 DIAGNOSIS — N898 Other specified noninflammatory disorders of vagina: Secondary | ICD-10-CM | POA: Insufficient documentation

## 2021-03-03 DIAGNOSIS — R69 Illness, unspecified: Secondary | ICD-10-CM | POA: Diagnosis not present

## 2021-03-03 DIAGNOSIS — E1169 Type 2 diabetes mellitus with other specified complication: Secondary | ICD-10-CM | POA: Diagnosis not present

## 2021-03-04 LAB — MOLECULAR ANCILLARY ONLY
Bacterial Vaginitis (gardnerella): POSITIVE — AB
Candida Glabrata: NEGATIVE
Candida Vaginitis: NEGATIVE
Chlamydia: NEGATIVE
Comment: NEGATIVE
Comment: NEGATIVE
Comment: NEGATIVE
Comment: NEGATIVE
Comment: NEGATIVE
Comment: NORMAL
Neisseria Gonorrhea: NEGATIVE
Trichomonas: NEGATIVE

## 2021-03-16 ENCOUNTER — Ambulatory Visit
Admission: RE | Admit: 2021-03-16 | Discharge: 2021-03-16 | Disposition: A | Discharge status: Home / Self Care | Payer: Medicare HMO | Source: Ambulatory Visit | Attending: Family Medicine | Admitting: Family Medicine

## 2021-03-16 ENCOUNTER — Other Ambulatory Visit: Payer: Self-pay

## 2021-03-16 DIAGNOSIS — Z1231 Encounter for screening mammogram for malignant neoplasm of breast: Secondary | ICD-10-CM

## 2021-03-20 DIAGNOSIS — I10 Essential (primary) hypertension: Secondary | ICD-10-CM | POA: Diagnosis not present

## 2021-03-20 DIAGNOSIS — I5020 Unspecified systolic (congestive) heart failure: Secondary | ICD-10-CM | POA: Diagnosis not present

## 2021-03-20 DIAGNOSIS — I420 Dilated cardiomyopathy: Secondary | ICD-10-CM | POA: Diagnosis not present

## 2021-03-20 DIAGNOSIS — E119 Type 2 diabetes mellitus without complications: Secondary | ICD-10-CM | POA: Diagnosis not present

## 2021-03-21 ENCOUNTER — Encounter: Payer: Self-pay | Admitting: Emergency Medicine

## 2021-03-21 ENCOUNTER — Ambulatory Visit (INDEPENDENT_AMBULATORY_CARE_PROVIDER_SITE_OTHER): Payer: Medicare HMO

## 2021-03-21 ENCOUNTER — Ambulatory Visit
Admission: EM | Admit: 2021-03-21 | Discharge: 2021-03-21 | Disposition: A | Discharge status: Home / Self Care | Payer: Medicare HMO

## 2021-03-21 ENCOUNTER — Other Ambulatory Visit: Payer: Self-pay

## 2021-03-21 DIAGNOSIS — S0081XA Abrasion of other part of head, initial encounter: Secondary | ICD-10-CM

## 2021-03-21 DIAGNOSIS — S0452XA Injury of facial nerve, left side, initial encounter: Secondary | ICD-10-CM

## 2021-03-21 DIAGNOSIS — W19XXXA Unspecified fall, initial encounter: Secondary | ICD-10-CM

## 2021-03-21 DIAGNOSIS — S0993XA Unspecified injury of face, initial encounter: Secondary | ICD-10-CM | POA: Diagnosis not present

## 2021-03-21 MED ORDER — DOXYCYCLINE HYCLATE 100 MG PO CAPS: 100.0000 mg | ORAL_CAPSULE | Freq: Two times a day (BID) | ORAL | 0 refills | Status: AC

## 2021-03-21 MED ORDER — MUPIROCIN 2 % EX OINT: 1.0000 "application " | TOPICAL_OINTMENT | Freq: Three times a day (TID) | CUTANEOUS | 0 refills | Status: AC

## 2021-03-21 NOTE — Discharge Instructions (Addendum)
Complete entire course of antibiotics to prevent any type of an infection involving the abrasions on your face.  Apply Bactroban ointment directly to the abrasive wound areas.  If you experience any worsening facial swelling or eyelid swelling or severe pain or pressure directly behind the left eye go immediately to the emergency department.  Your facial images today were negative for any fracture related to your fall.

## 2021-03-21 NOTE — ED Provider Notes (Signed)
[facility]    CSN: [id number] Arrival date & time: 03/21/21  1251      History   Chief Complaint Chief Complaint  Patient presents with  . Fall  . Eye Pain    HPI [name] is a 60 y.o. female.   HPI  Patient sustained a fall yesterday while carrying packages she tripped and fell landing on the left portion of her face injuring her left thigh and left jaw. She has applied ice to reduce the swelling however woke up today and had severe swelling and pain with opening closing of her jaw along with opening closing of her left eye.  Per EMR last Tdap 2019. Past Medical History:  Diagnosis Date  . Arthritis   . Bowel obstruction ([place])   . Bronchitis   . CHF (congestive heart failure) ([place])   . Chronic systolic dysfunction of left ventricle   . Diabetes mellitus without complication (HCC)    on oral meds  . GERD (gastroesophageal reflux disease)   . Headache(784.0)    migraines, hasn't had one for over a year  . History of noncompliance with medical treatment   . Hypercholesteremia   . Hypertension   . LVH (left ventricular hypertrophy)   . Nonischemic cardiomyopathy ([place])   . Polysubstance abuse ([place])   . Tobacco abuse disorder     Patient Active Problem List   Diagnosis Date Noted  . CKD (chronic kidney disease) 06/28/2018  . Chronic systolic CHF (congestive heart failure) ([place]) 06/28/2018  . 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 Nephropathy associated with another disease 06/28/2018  . Lumbago 06/28/2018  . Vitamin D deficiency, unspecified 06/28/2018  . Routine general medical examination at a health care facility 06/28/2018  . Dry eye syndrome 06/28/2018  . Imbalance 10/04/2017  . Diabetes ([place]) 08/09/2016  . Left acoustic neuroma ([place]) 10/03/2015  . Essential hypertension 03/29/2012  . Chronic systolic dysfunction of left ventricle 11/24/2011  . ACS (acute coronary syndrome) ([place]) 11/23/2011  .  CHF, acute ([place]) 11/23/2011  . Cocaine abuse ([place]) 11/23/2011  . Tobacco abuse 11/23/2011  . Pneumonitis, aspiration ([place]) 11/23/2011  . Cardiomyopathy ([place]) 11/23/2011  . HTN (hypertension), malignant 11/23/2011    Past Surgical History:  Procedure Laterality Date  . ABDOMINAL HYSTERECTOMY    . ABDOMINAL SURGERY    . left foot surgery    . STRABISMUS SURGERY Left 07/31/2013   Procedure: REPAIR STRABISMUS;  Surgeon: Derry Skill, MD;  Location: [facility];  Service: Ophthalmology;  Laterality: Left;  EYE MUSCLE SURGERY LEFT EYE  . UTERINE FIBROID SURGERY      OB History   No obstetric history on file.      Home Medications    Prior to Admission medications   Medication Sig Start Date End Date Taking? Authorizing Provider  doxycycline (VIBRAMYCIN) 100 MG capsule Take 1 capsule (100 mg total) by mouth 2 (two) times daily for 7 days. 03/21/21 03/28/21 Yes Scot Jun, FNP  mupirocin ointment (BACTROBAN) 2 % Apply 1 application topically 3 (three) times daily. 03/21/21  Yes Scot Jun, FNP  aspirin 81 MG EC tablet Take 81 mg by mouth daily. Swallow whole.    [provider]  atorvastatin (LIPITOR) 40 MG tablet Take 1 tablet (40 mg total) by mouth daily at 6 PM. 02/15/19   Glendale Chard, MD  baclofen (LIORESAL) 10 MG tablet TAKE 1 TABLET BY MOUTH TWO  TIMES DAILY AS NEEDED 01/16/19  Glendale Chard, MD  carvedilol (COREG) 25 MG tablet Take 1 tablet (25 mg total) by mouth 2 (two) times daily with a meal. 10/16/18   Glendale Chard, MD  Cholecalciferol (VITAMIN D3) 2000 units TABS Take 2,000 Units/day by mouth every morning.    [provider]  CHOLINE-INOSITOL-METHIONINE IM Inject into the muscle.    [provider]  FARXIGA 5 MG TABS tablet  03/21/19   [provider]  FARXIGA 5 MG TABS tablet TAKE 1 TABLET BY MOUTH EVERY DAY 09/05/19   Glendale Chard, MD  folic acid (FOLVITE) 1 MG tablet Take 1 tablet (1 mg total) by mouth daily. 08/21/18    Glendale Chard, MD  furosemide (LASIX) 40 MG tablet Take by mouth.    [provider]  gabapentin (NEURONTIN) 300 MG capsule Take 300 mg by mouth 2 (two) times daily.    [provider]  lisinopril (PRINIVIL,ZESTRIL) 20 MG tablet Take 1 tablet (20 mg total) by mouth daily. Patient taking differently: Take 20 mg by mouth 2 (two) times daily. 02/15/19   Glendale Chard, MD  LORazepam (ATIVAN) 0.5 MG tablet 1 tab po q 4-6 hours prn or 1 tab po 30 minutes prior to radiation Patient not taking: Reported on 06/18/2019 05/22/19   Hayden Pedro, PA-C  losartan (COZAAR) 100 MG tablet Take 1 tablet by mouth daily. 02/02/21   [provider]  magnesium oxide (MAG-OX) 400 (241.3 Mg) MG tablet Take 1 tablet (400 mg total) by mouth daily. 02/15/19   Glendale Chard, MD  meloxicam (MOBIC) 15 MG tablet Take 15 mg by mouth daily.    [provider]  nitrofurantoin, macrocrystal-monohydrate, (MACROBID) 100 MG capsule Take 1 capsule (100 mg total) by mouth 2 (two) times daily. 06/21/19   Constant, Peggy, MD  Nutritional Supplements (L-GLUTAMINE/CHOLINE/INOSITOL PO) Take by mouth.    [provider]  omeprazole (PRILOSEC) 40 MG capsule Take 40 mg by mouth daily.    [provider]  pyridOXINE (VITAMIN B-6) 100 MG tablet Take 100 mg by mouth daily.    [provider]  spironolactone (ALDACTONE) 25 MG tablet Take 25 mg by mouth daily.    [provider]  SYMBICORT 160-4.5 MCG/ACT inhaler SMARTSIG:2 Puff(s) By Mouth Twice Daily 02/27/21   [provider]    Family History Family History  Problem Relation Age of Onset  . Diabetes Mother   . Hypertension Mother   . Heart disease Mother   . Heart disease Father   . Diabetes Father   . Hypertension Father   . Breast cancer Neg Hx     Social History Social History   Tobacco Use  . Smoking status: Former Smoker    Packs/day: 0.50    Years: 30.00    Pack years: 15.00    Quit  date: 07/02/2012    Years since quitting: 8.7  . Smokeless tobacco: Never Used  . Tobacco comment: she is not ready to quit  Vaping Use  . Vaping Use: Never used  Substance Use Topics  . Alcohol use: Yes    Alcohol/week: 0.0 standard drinks    Comment: occassionally  . Drug use: Yes    Types: Marijuana     Allergies   Codeine  Review of Systems Review of Systems Pertinent negatives listed in HPI Physical Exam Triage Vital Signs ED Triage Vitals  Enc Vitals Group     BP 03/21/21 1314 (!) 127/92     Pulse Rate 03/21/21 1314 74  Resp 03/21/21 1314 17     Temp 03/21/21 1314 98.5 F (36.9 C)     Temp Source 03/21/21 1314 Oral     SpO2 03/21/21 1314 97 %     Weight --      Height --      Head Circumference --      Peak Flow --      Pain Score 03/21/21 1311 5     Pain Loc --      Pain Edu? --      Excl. in [place]? --    No data found.  Updated Vital Signs BP (!) 127/92   Pulse 74   Temp 98.5 F (36.9 C) (Oral)   Resp 17   SpO2 97%   Visual Acuity Right Eye Distance:   Left Eye Distance:   Bilateral Distance:    Right Eye Near:   Left Eye Near:    Bilateral Near:     Physical Exam HENT:     Head:   Cardiovascular:     Rate and Rhythm: Normal rate and regular rhythm.  Pulmonary:     Effort: Pulmonary effort is normal.     Breath sounds: Normal breath sounds.  Skin:    Capillary Refill: Capillary refill takes less than 2 seconds.  Neurological:     General: No focal deficit present.     Mental Status: She is alert.  Psychiatric:        Mood and Affect: Mood normal.        Behavior: Behavior normal.        Thought Content: Thought content normal.        Judgment: Judgment normal.     UC Treatments / Results  Labs (all labs ordered are listed, but only abnormal results are displayed) Labs Reviewed - No data to display  EKG   Radiology DG Facial Bones 1-2 Views  Result Date: 03/21/2021 CLINICAL DATA:  Fall with LEFT facial injury.  Initial  encounter. EXAM: FACIAL BONES - 1-2 VIEW COMPARISON:  None. FINDINGS: There is no evidence of fracture or other significant bone abnormality. No orbital emphysema or sinus air-fluid levels are seen. IMPRESSION: Negative. Electronically Signed   By: Margarette Canada M.D.   On: 03/21/2021 14:22    Procedures Procedures (including critical care time)  Medications Ordered in UC Medications - No data to display  Initial Impression / Assessment and Plan / UC Course  I have reviewed the triage vital signs and the nursing notes.  Pertinent labs & imaging results that were available during my care of the patient were reviewed by me and considered in my medical decision making (see chart for details).     Facial abrasion, home management per discharge instructions.  Imaging negative for fracture patient does have a history of diabetes given the swelling tenderness and redness of the wound will cover with a short course of antibiotics prophylactically to prevent infection.  There is some concern given the close proximity to eye or periorbital cellulitis therefore antibiotic coverage is warranted.  Encouraged to continue keep areas clean and apply Bactroban.  ER precautions given  Final Clinical Impressions(s) / UC Diagnoses   Final diagnoses:  Fall, initial encounter  Facial abrasion, initial encounter     Discharge Instructions     Complete entire course of antibiotics to prevent any type of an infection involving the abrasions on your face.  Apply Bactroban ointment directly to the abrasive wound areas.  If you experience any  worsening facial swelling or eyelid swelling or severe pain or pressure directly behind the left eye go immediately to the emergency department.  Your facial images today were negative for any fracture related to your fall.   ED Prescriptions    Medication Sig Dispense Auth. Provider   doxycycline (VIBRAMYCIN) 100 MG capsule Take 1 capsule (100 mg total) by mouth 2 (two) times  daily for 7 days. 14 capsule Scot Jun, FNP   mupirocin ointment (BACTROBAN) 2 % Apply 1 application topically 3 (three) times daily. 30 g Scot Jun, FNP     PDMP not reviewed this encounter.   Scot Jun, FNP 03/24/21 1416

## 2021-03-25 DIAGNOSIS — E119 Type 2 diabetes mellitus without complications: Secondary | ICD-10-CM | POA: Diagnosis not present

## 2021-03-25 DIAGNOSIS — J45909 Unspecified asthma, uncomplicated: Secondary | ICD-10-CM | POA: Diagnosis not present

## 2021-03-25 DIAGNOSIS — F419 Anxiety disorder, unspecified: Secondary | ICD-10-CM | POA: Diagnosis not present

## 2021-03-25 DIAGNOSIS — I2510 Atherosclerotic heart disease of native coronary artery without angina pectoris: Secondary | ICD-10-CM | POA: Diagnosis not present

## 2021-03-25 DIAGNOSIS — M1990 Unspecified osteoarthritis, unspecified site: Secondary | ICD-10-CM | POA: Diagnosis not present

## 2021-03-25 DIAGNOSIS — R030 Elevated blood-pressure reading, without diagnosis of hypertension: Secondary | ICD-10-CM | POA: Diagnosis not present

## 2021-03-25 DIAGNOSIS — Z7984 Long term (current) use of oral hypoglycemic drugs: Secondary | ICD-10-CM | POA: Diagnosis not present

## 2021-03-25 DIAGNOSIS — R69 Illness, unspecified: Secondary | ICD-10-CM | POA: Diagnosis not present

## 2021-03-25 DIAGNOSIS — E785 Hyperlipidemia, unspecified: Secondary | ICD-10-CM | POA: Diagnosis not present

## 2021-03-25 DIAGNOSIS — Z7951 Long term (current) use of inhaled steroids: Secondary | ICD-10-CM | POA: Diagnosis not present

## 2021-03-27 DIAGNOSIS — S00209A Unspecified superficial injury of unspecified eyelid and periocular area, initial encounter: Secondary | ICD-10-CM | POA: Diagnosis not present

## 2021-03-27 DIAGNOSIS — Z9181 History of falling: Secondary | ICD-10-CM | POA: Diagnosis not present

## 2021-04-01 ENCOUNTER — Other Ambulatory Visit (HOSPITAL_BASED_OUTPATIENT_CLINIC_OR_DEPARTMENT_OTHER): Payer: Self-pay

## 2021-04-01 ENCOUNTER — Ambulatory Visit: Payer: Medicare HMO

## 2021-04-02 DIAGNOSIS — Z0001 Encounter for general adult medical examination with abnormal findings: Secondary | ICD-10-CM | POA: Diagnosis not present

## 2021-04-02 DIAGNOSIS — M130 Polyarthritis, unspecified: Secondary | ICD-10-CM | POA: Diagnosis not present

## 2021-04-02 DIAGNOSIS — R69 Illness, unspecified: Secondary | ICD-10-CM | POA: Diagnosis not present

## 2021-04-02 DIAGNOSIS — E1169 Type 2 diabetes mellitus with other specified complication: Secondary | ICD-10-CM | POA: Diagnosis not present

## 2021-04-09 ENCOUNTER — Ambulatory Visit: Payer: Medicare HMO | Attending: Internal Medicine

## 2021-04-09 ENCOUNTER — Other Ambulatory Visit: Payer: Self-pay

## 2021-04-09 ENCOUNTER — Other Ambulatory Visit (HOSPITAL_BASED_OUTPATIENT_CLINIC_OR_DEPARTMENT_OTHER): Payer: Self-pay

## 2021-04-09 DIAGNOSIS — Z23 Encounter for immunization: Secondary | ICD-10-CM

## 2021-04-09 MED ORDER — PFIZER-BIONT COVID-19 VAC-TRIS 30 MCG/0.3ML IM SUSP
INTRAMUSCULAR | 0 refills | Status: DC
  Filled 2021-04-09: qty 0.3, 1d supply, fill #0

## 2021-04-09 NOTE — Progress Notes (Signed)
   Covid-19 Vaccination Clinic  Name:  EULINE KIMBLER    MRN: [id number] DOB: 1961-02-26  04/09/2021  Ms. Cacioppo was observed post Covid-19 immunization for 15 minutes without incident. She was provided with Vaccine Information Sheet and instruction to access the V-Safe system.   Ms. Hofmeister was instructed to call 911 with any severe reactions post vaccine: Difficulty breathing  Swelling of face and throat  A fast heartbeat  A bad rash all over body  Dizziness and weakness   Immunizations Administered     Name Date Dose VIS Date Route   PFIZER Comrnaty(Gray TOP) Covid-19 Vaccine 04/09/2021  9:38 AM 0.3 mL 10/09/2020 Intramuscular   Manufacturer: [place]   Lot: [id number]   [place]: [phone]

## 2021-05-12 DIAGNOSIS — E6609 Other obesity due to excess calories: Secondary | ICD-10-CM | POA: Diagnosis not present

## 2021-05-12 DIAGNOSIS — M130 Polyarthritis, unspecified: Secondary | ICD-10-CM | POA: Diagnosis not present

## 2021-05-12 DIAGNOSIS — I10 Essential (primary) hypertension: Secondary | ICD-10-CM | POA: Diagnosis not present

## 2021-05-12 DIAGNOSIS — E782 Mixed hyperlipidemia: Secondary | ICD-10-CM | POA: Diagnosis not present

## 2021-05-12 DIAGNOSIS — Z0001 Encounter for general adult medical examination with abnormal findings: Secondary | ICD-10-CM | POA: Diagnosis not present

## 2021-05-12 DIAGNOSIS — D473 Essential (hemorrhagic) thrombocythemia: Secondary | ICD-10-CM | POA: Diagnosis not present

## 2021-05-12 DIAGNOSIS — Z6825 Body mass index (BMI) 25.0-25.9, adult: Secondary | ICD-10-CM | POA: Diagnosis not present

## 2021-05-12 DIAGNOSIS — E1169 Type 2 diabetes mellitus with other specified complication: Secondary | ICD-10-CM | POA: Diagnosis not present

## 2021-05-13 DIAGNOSIS — Z0000 Encounter for general adult medical examination without abnormal findings: Secondary | ICD-10-CM | POA: Diagnosis not present

## 2021-05-13 DIAGNOSIS — M130 Polyarthritis, unspecified: Secondary | ICD-10-CM | POA: Diagnosis not present

## 2021-05-13 DIAGNOSIS — E1169 Type 2 diabetes mellitus with other specified complication: Secondary | ICD-10-CM | POA: Diagnosis not present

## 2021-05-13 DIAGNOSIS — E559 Vitamin D deficiency, unspecified: Secondary | ICD-10-CM | POA: Diagnosis not present

## 2021-05-13 DIAGNOSIS — E782 Mixed hyperlipidemia: Secondary | ICD-10-CM | POA: Diagnosis not present

## 2021-05-13 DIAGNOSIS — I10 Essential (primary) hypertension: Secondary | ICD-10-CM | POA: Diagnosis not present

## 2021-05-13 DIAGNOSIS — D473 Essential (hemorrhagic) thrombocythemia: Secondary | ICD-10-CM | POA: Diagnosis not present

## 2021-06-09 DIAGNOSIS — I5020 Unspecified systolic (congestive) heart failure: Secondary | ICD-10-CM | POA: Diagnosis not present

## 2021-06-09 DIAGNOSIS — I10 Essential (primary) hypertension: Secondary | ICD-10-CM | POA: Diagnosis not present

## 2021-06-09 DIAGNOSIS — I420 Dilated cardiomyopathy: Secondary | ICD-10-CM | POA: Diagnosis not present

## 2021-06-09 DIAGNOSIS — E785 Hyperlipidemia, unspecified: Secondary | ICD-10-CM | POA: Diagnosis not present

## 2021-06-10 DIAGNOSIS — E1169 Type 2 diabetes mellitus with other specified complication: Secondary | ICD-10-CM | POA: Diagnosis not present

## 2021-06-10 DIAGNOSIS — T1490XS Injury, unspecified, sequela: Secondary | ICD-10-CM | POA: Diagnosis not present

## 2021-06-29 ENCOUNTER — Ambulatory Visit
Admission: EM | Admit: 2021-06-29 | Discharge: 2021-06-29 | Disposition: A | Discharge status: Home / Self Care | Payer: Medicare HMO | Attending: Physician Assistant | Admitting: Physician Assistant

## 2021-06-29 ENCOUNTER — Other Ambulatory Visit: Payer: Self-pay

## 2021-06-29 ENCOUNTER — Encounter: Payer: Self-pay | Admitting: Emergency Medicine

## 2021-06-29 DIAGNOSIS — T300 Burn of unspecified body region, unspecified degree: Secondary | ICD-10-CM

## 2021-06-29 MED ORDER — SILVER SULFADIAZINE 1 % EX CREA: 1.0000 "application " | TOPICAL_CREAM | Freq: Every day | CUTANEOUS | 0 refills | Status: DC
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
## 2021-06-29 NOTE — Discharge Instructions (Addendum)
Use cream as prescribed Keep area cleaned and dressed Return if you have increased redness, swelling, or drainage Keep appointment with PCP for recheck
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
## 2021-07-08 DIAGNOSIS — T1490XS Injury, unspecified, sequela: Secondary | ICD-10-CM | POA: Diagnosis not present

## 2021-07-08 DIAGNOSIS — E1169 Type 2 diabetes mellitus with other specified complication: Secondary | ICD-10-CM | POA: Diagnosis not present

## 2021-08-18 DIAGNOSIS — J441 Chronic obstructive pulmonary disease with (acute) exacerbation: Secondary | ICD-10-CM | POA: Diagnosis not present

## 2021-08-18 DIAGNOSIS — E1169 Type 2 diabetes mellitus with other specified complication: Secondary | ICD-10-CM | POA: Diagnosis not present

## 2021-08-18 DIAGNOSIS — E782 Mixed hyperlipidemia: Secondary | ICD-10-CM | POA: Diagnosis not present

## 2021-08-18 DIAGNOSIS — F339 Major depressive disorder, recurrent, unspecified: Secondary | ICD-10-CM | POA: Diagnosis not present

## 2021-08-18 DIAGNOSIS — Z23 Encounter for immunization: Secondary | ICD-10-CM | POA: Diagnosis not present

## 2021-08-18 DIAGNOSIS — I10 Essential (primary) hypertension: Secondary | ICD-10-CM | POA: Diagnosis not present

## 2021-08-18 DIAGNOSIS — F319 Bipolar disorder, unspecified: Secondary | ICD-10-CM | POA: Diagnosis not present

## 2021-09-09 DIAGNOSIS — I5020 Unspecified systolic (congestive) heart failure: Secondary | ICD-10-CM | POA: Diagnosis not present

## 2021-09-09 DIAGNOSIS — E785 Hyperlipidemia, unspecified: Secondary | ICD-10-CM | POA: Diagnosis not present

## 2021-09-09 DIAGNOSIS — I420 Dilated cardiomyopathy: Secondary | ICD-10-CM | POA: Diagnosis not present

## 2021-09-09 DIAGNOSIS — I10 Essential (primary) hypertension: Secondary | ICD-10-CM | POA: Diagnosis not present

## 2021-09-14 DIAGNOSIS — T8484XA Pain due to internal orthopedic prosthetic devices, implants and grafts, initial encounter: Secondary | ICD-10-CM | POA: Diagnosis not present

## 2021-09-14 DIAGNOSIS — S92311K Displaced fracture of first metatarsal bone, right foot, subsequent encounter for fracture with nonunion: Secondary | ICD-10-CM | POA: Diagnosis not present

## 2021-09-14 DIAGNOSIS — M79671 Pain in right foot: Secondary | ICD-10-CM | POA: Diagnosis not present

## 2021-09-22 DIAGNOSIS — F064 Anxiety disorder due to known physiological condition: Secondary | ICD-10-CM | POA: Diagnosis not present

## 2021-09-22 DIAGNOSIS — D473 Essential (hemorrhagic) thrombocythemia: Secondary | ICD-10-CM | POA: Diagnosis not present

## 2021-09-22 DIAGNOSIS — F319 Bipolar disorder, unspecified: Secondary | ICD-10-CM | POA: Diagnosis not present

## 2021-09-22 DIAGNOSIS — I10 Essential (primary) hypertension: Secondary | ICD-10-CM | POA: Diagnosis not present

## 2021-09-22 DIAGNOSIS — E1169 Type 2 diabetes mellitus with other specified complication: Secondary | ICD-10-CM | POA: Diagnosis not present

## 2021-09-22 DIAGNOSIS — M130 Polyarthritis, unspecified: Secondary | ICD-10-CM | POA: Diagnosis not present

## 2021-09-30 DIAGNOSIS — E782 Mixed hyperlipidemia: Secondary | ICD-10-CM | POA: Diagnosis not present

## 2021-09-30 DIAGNOSIS — E1169 Type 2 diabetes mellitus with other specified complication: Secondary | ICD-10-CM | POA: Diagnosis not present

## 2021-09-30 DIAGNOSIS — I10 Essential (primary) hypertension: Secondary | ICD-10-CM | POA: Diagnosis not present

## 2021-11-09 DIAGNOSIS — M7742 Metatarsalgia, left foot: Secondary | ICD-10-CM | POA: Diagnosis not present

## 2021-11-09 DIAGNOSIS — T8484XA Pain due to internal orthopedic prosthetic devices, implants and grafts, initial encounter: Secondary | ICD-10-CM | POA: Diagnosis not present

## 2021-11-09 DIAGNOSIS — M2041 Other hammer toe(s) (acquired), right foot: Secondary | ICD-10-CM | POA: Diagnosis not present

## 2021-11-09 DIAGNOSIS — M79671 Pain in right foot: Secondary | ICD-10-CM | POA: Diagnosis not present

## 2021-11-11 DIAGNOSIS — I10 Essential (primary) hypertension: Secondary | ICD-10-CM | POA: Diagnosis not present

## 2021-11-11 DIAGNOSIS — M130 Polyarthritis, unspecified: Secondary | ICD-10-CM | POA: Diagnosis not present

## 2021-11-11 DIAGNOSIS — J441 Chronic obstructive pulmonary disease with (acute) exacerbation: Secondary | ICD-10-CM | POA: Diagnosis not present

## 2021-11-11 DIAGNOSIS — E782 Mixed hyperlipidemia: Secondary | ICD-10-CM | POA: Diagnosis not present

## 2021-11-11 DIAGNOSIS — E1169 Type 2 diabetes mellitus with other specified complication: Secondary | ICD-10-CM | POA: Diagnosis not present

## 2021-11-11 DIAGNOSIS — N951 Menopausal and female climacteric states: Secondary | ICD-10-CM | POA: Diagnosis not present

## 2021-11-11 DIAGNOSIS — J440 Chronic obstructive pulmonary disease with acute lower respiratory infection: Secondary | ICD-10-CM | POA: Diagnosis not present

## 2021-11-11 DIAGNOSIS — F319 Bipolar disorder, unspecified: Secondary | ICD-10-CM | POA: Diagnosis not present

## 2021-11-24 DIAGNOSIS — M2041 Other hammer toe(s) (acquired), right foot: Secondary | ICD-10-CM | POA: Diagnosis not present

## 2021-11-24 DIAGNOSIS — T8484XA Pain due to internal orthopedic prosthetic devices, implants and grafts, initial encounter: Secondary | ICD-10-CM | POA: Diagnosis not present

## 2021-11-24 DIAGNOSIS — M2021 Hallux rigidus, right foot: Secondary | ICD-10-CM | POA: Diagnosis not present

## 2021-11-24 DIAGNOSIS — G8918 Other acute postprocedural pain: Secondary | ICD-10-CM | POA: Diagnosis not present

## 2021-12-09 DIAGNOSIS — E1169 Type 2 diabetes mellitus with other specified complication: Secondary | ICD-10-CM | POA: Diagnosis not present

## 2021-12-09 DIAGNOSIS — I10 Essential (primary) hypertension: Secondary | ICD-10-CM | POA: Diagnosis not present

## 2021-12-09 DIAGNOSIS — F319 Bipolar disorder, unspecified: Secondary | ICD-10-CM | POA: Diagnosis not present

## 2021-12-09 DIAGNOSIS — L239 Allergic contact dermatitis, unspecified cause: Secondary | ICD-10-CM | POA: Diagnosis not present

## 2021-12-25 DIAGNOSIS — N951 Menopausal and female climacteric states: Secondary | ICD-10-CM | POA: Diagnosis not present

## 2021-12-25 DIAGNOSIS — E559 Vitamin D deficiency, unspecified: Secondary | ICD-10-CM | POA: Diagnosis not present

## 2021-12-25 DIAGNOSIS — M130 Polyarthritis, unspecified: Secondary | ICD-10-CM | POA: Diagnosis not present

## 2021-12-25 DIAGNOSIS — E1169 Type 2 diabetes mellitus with other specified complication: Secondary | ICD-10-CM | POA: Diagnosis not present

## 2021-12-25 DIAGNOSIS — I10 Essential (primary) hypertension: Secondary | ICD-10-CM | POA: Diagnosis not present

## 2022-01-06 DIAGNOSIS — T8484XA Pain due to internal orthopedic prosthetic devices, implants and grafts, initial encounter: Secondary | ICD-10-CM | POA: Diagnosis not present

## 2022-01-06 DIAGNOSIS — M79671 Pain in right foot: Secondary | ICD-10-CM | POA: Diagnosis not present

## 2022-01-06 DIAGNOSIS — Z4889 Encounter for other specified surgical aftercare: Secondary | ICD-10-CM | POA: Diagnosis not present

## 2022-01-20 DIAGNOSIS — M79671 Pain in right foot: Secondary | ICD-10-CM | POA: Diagnosis not present

## 2022-01-20 DIAGNOSIS — M2041 Other hammer toe(s) (acquired), right foot: Secondary | ICD-10-CM | POA: Diagnosis not present

## 2022-02-03 DIAGNOSIS — S92911K Unspecified fracture of right toe(s), subsequent encounter for fracture with nonunion: Secondary | ICD-10-CM | POA: Diagnosis not present

## 2022-02-03 DIAGNOSIS — M79671 Pain in right foot: Secondary | ICD-10-CM | POA: Diagnosis not present

## 2022-02-03 DIAGNOSIS — T8484XA Pain due to internal orthopedic prosthetic devices, implants and grafts, initial encounter: Secondary | ICD-10-CM | POA: Diagnosis not present

## 2022-02-23 ENCOUNTER — Ambulatory Visit: Payer: Medicare HMO

## 2022-02-23 ENCOUNTER — Encounter: Payer: Self-pay | Admitting: Podiatry

## 2022-02-23 ENCOUNTER — Ambulatory Visit (INDEPENDENT_AMBULATORY_CARE_PROVIDER_SITE_OTHER): Payer: Medicare HMO

## 2022-02-23 ENCOUNTER — Ambulatory Visit: Payer: Medicare HMO | Admitting: Podiatry

## 2022-02-23 DIAGNOSIS — M2042 Other hammer toe(s) (acquired), left foot: Secondary | ICD-10-CM

## 2022-02-23 DIAGNOSIS — M2041 Other hammer toe(s) (acquired), right foot: Secondary | ICD-10-CM | POA: Diagnosis not present

## 2022-02-23 DIAGNOSIS — M21612 Bunion of left foot: Secondary | ICD-10-CM

## 2022-02-23 DIAGNOSIS — Q72899 Other reduction defects of unspecified lower limb: Secondary | ICD-10-CM

## 2022-02-23 DIAGNOSIS — L84 Corns and callosities: Secondary | ICD-10-CM | POA: Diagnosis not present

## 2022-02-23 DIAGNOSIS — M21611 Bunion of right foot: Secondary | ICD-10-CM

## 2022-02-23 DIAGNOSIS — Z9889 Other specified postprocedural states: Secondary | ICD-10-CM

## 2022-02-23 NOTE — Progress Notes (Signed)
?  Subjective:  ?Patient ID: [name], female    DOB: [birth year],   MRN: [id number] ? ?Chief Complaint  ?Patient presents with  ? Toe Pain  ?   ?bil swollen, 2nd toe pain  ? ? ?61 y.o. female presents for concern of mostly bilateral second toe pain. Relates back in January she had reconstructive surgery on her right foot with Dr. Doran Durand. Relates she is not happy with the results and how her second toe moves over. She also relates concern about the 5th toe hammering. Denies much pain but it will come and go and sounds nerve relates. She does have  a history of right fourth toe amputation and brachymetatarsia as well. She also relates deformity on the left foot that she feels will eventually need surgery. . Denies any other pedal complaints. Denies n/v/f/c.  ? ?Past Medical History:  ?Diagnosis Date  ? Arthritis   ? Bowel obstruction ([place])   ? Bronchitis   ? CHF (congestive heart failure) ([place])   ? Chronic systolic dysfunction of left ventricle   ? Diabetes mellitus without complication ([place])   ? on oral meds  ? GERD (gastroesophageal reflux disease)   ? Headache(784.0)   ? migraines, hasn't had one for over a year  ? History of noncompliance with medical treatment   ? Hypercholesteremia   ? Hypertension   ? LVH (left ventricular hypertrophy)   ? Nonischemic cardiomyopathy ([place])   ? Polysubstance abuse ([place])   ? Tobacco abuse disorder   ? ? ?Objective:  ?Physical Exam: ?Vascular: DP/PT pulses 2/4 bilateral. CFT <3 seconds. Normal hair growth on digits. No edema.  ?Skin. No lacerations or abrasions bilateral feet.  ?Musculoskeletal: MMT 5/5 bilateral lower extremities in DF, PF, Inversion and Eversion. Deceased ROM in DF of ankle joint. Right foot with surgical scars first second and third digits and amputation of fourth digit. Fifth digit hammered. Second digit valgus deformity. No pain to palpation and some edema noted to second digit. Left foot HAV deformity with hammered digits 2-5. Relates pain but no pain  to palpation.  ?Neurological: Sensation intact to light touch.  ? ?Assessment:  ? ?1. Bilateral bunions   ?2. Hammertoes of both feet   ?3. Brachymetatarsia of fourth metatarsal bone   ?4. Callus of foot   ?5. S/P foot surgery, right   ? ? ? ?Plan:  ?Patient was evaluated and treated and all questions answered. ?-Xrays reviewed Right foot with retained hardware from first MPJ fusion as well as second and third digit hammertoe repairs. Fourth digit amptuation. Left HAV deformity and hammering of 2-5 digits. Brachymetatrsia of bilateral fourth metatarsals.  ?-Discussed HAV, hammertoes, and foot deformity following surgery and treatment options;conservative and surgical management; risks, benefits, alternatives discussed. All patient's questions answered. ?-Discussed padding and wide shoe gear.   ?-Recommend continue with good supportive shoes and inserts.  ?-Discussed surgical options. Do not recommend any surgical intervention at this time as she is just still recovering from surgery earlier this year. Will see if swelling comes down and that reliefs some of her pain. Discussed her surgical options will be limited on the right. Discussed sorting out right foot and then may consider evalutating left foot.  ?-Patient to return to office in at least 6 months for evaluation.  ? ? ?Lorenda Peck, DPM  ? ? ?

## 2022-03-10 DIAGNOSIS — E119 Type 2 diabetes mellitus without complications: Secondary | ICD-10-CM | POA: Diagnosis not present

## 2022-03-10 DIAGNOSIS — I10 Essential (primary) hypertension: Secondary | ICD-10-CM | POA: Diagnosis not present

## 2022-03-10 DIAGNOSIS — I5020 Unspecified systolic (congestive) heart failure: Secondary | ICD-10-CM | POA: Diagnosis not present

## 2022-03-10 DIAGNOSIS — I420 Dilated cardiomyopathy: Secondary | ICD-10-CM | POA: Diagnosis not present

## 2022-03-10 DIAGNOSIS — E785 Hyperlipidemia, unspecified: Secondary | ICD-10-CM | POA: Diagnosis not present

## 2022-03-15 DIAGNOSIS — M25562 Pain in left knee: Secondary | ICD-10-CM | POA: Diagnosis not present

## 2022-03-25 DIAGNOSIS — E6609 Other obesity due to excess calories: Secondary | ICD-10-CM | POA: Diagnosis not present

## 2022-03-25 DIAGNOSIS — E559 Vitamin D deficiency, unspecified: Secondary | ICD-10-CM | POA: Diagnosis not present

## 2022-03-25 DIAGNOSIS — M130 Polyarthritis, unspecified: Secondary | ICD-10-CM | POA: Diagnosis not present

## 2022-03-25 DIAGNOSIS — F064 Anxiety disorder due to known physiological condition: Secondary | ICD-10-CM | POA: Diagnosis not present

## 2022-03-25 DIAGNOSIS — I10 Essential (primary) hypertension: Secondary | ICD-10-CM | POA: Diagnosis not present

## 2022-03-25 DIAGNOSIS — E1169 Type 2 diabetes mellitus with other specified complication: Secondary | ICD-10-CM | POA: Diagnosis not present

## 2022-03-25 DIAGNOSIS — N951 Menopausal and female climacteric states: Secondary | ICD-10-CM | POA: Diagnosis not present

## 2022-03-25 DIAGNOSIS — F319 Bipolar disorder, unspecified: Secondary | ICD-10-CM | POA: Diagnosis not present

## 2022-03-30 ENCOUNTER — Ambulatory Visit: Payer: Medicare HMO | Admitting: Podiatry

## 2022-04-07 DIAGNOSIS — M25562 Pain in left knee: Secondary | ICD-10-CM | POA: Diagnosis not present

## 2022-04-09 DIAGNOSIS — M25511 Pain in right shoulder: Secondary | ICD-10-CM | POA: Diagnosis not present

## 2022-04-09 DIAGNOSIS — M25811 Other specified joint disorders, right shoulder: Secondary | ICD-10-CM | POA: Diagnosis not present

## 2022-04-09 DIAGNOSIS — M25512 Pain in left shoulder: Secondary | ICD-10-CM | POA: Diagnosis not present

## 2022-04-13 ENCOUNTER — Encounter: Payer: Self-pay | Admitting: Podiatry

## 2022-04-13 ENCOUNTER — Ambulatory Visit: Payer: Medicare HMO | Admitting: Podiatry

## 2022-04-13 DIAGNOSIS — Q72899 Other reduction defects of unspecified lower limb: Secondary | ICD-10-CM

## 2022-04-13 DIAGNOSIS — L84 Corns and callosities: Secondary | ICD-10-CM

## 2022-04-13 DIAGNOSIS — M2042 Other hammer toe(s) (acquired), left foot: Secondary | ICD-10-CM | POA: Diagnosis not present

## 2022-04-13 DIAGNOSIS — M2041 Other hammer toe(s) (acquired), right foot: Secondary | ICD-10-CM

## 2022-04-13 DIAGNOSIS — Z9889 Other specified postprocedural states: Secondary | ICD-10-CM

## 2022-04-13 NOTE — Progress Notes (Signed)
Subjective:  Patient ID: [name], female    DOB: 1961-01-09,   MRN: [id number]  No chief complaint on file.   61 y.o. female presents for concern of second toe pain on the right. Relates she is still getting a lot of tingling mostly in the second toe she did bang it on some wood a few weeks back and that irritated things but overall remains the same. Relates back in January she had reconstructive surgery on her right foot with Dr. Doran Durand.  She does have  a history of right fourth toe amputation and brachymetatarsia as well.  States most of the pain is tingling in nature. Refuses gabapentin. Takes meloxicam which helps. She also relates deformity on the left foot that she feels will eventually need surgery. . Denies any other pedal complaints. Denies n/v/f/c.   Past Medical History:  Diagnosis Date   Arthritis    Bowel obstruction (HCC)    Bronchitis    CHF (congestive heart failure) (HCC)    Chronic systolic dysfunction of left ventricle    Diabetes mellitus without complication (HCC)    on oral meds   GERD (gastroesophageal reflux disease)    Headache(784.0)    migraines, hasn't had one for over a year   History of noncompliance with medical treatment    Hypercholesteremia    Hypertension    LVH (left ventricular hypertrophy)    Nonischemic cardiomyopathy (HCC)    Polysubstance abuse ([place])    Tobacco abuse disorder     Objective:  Physical Exam: Vascular: DP/PT pulses 2/4 bilateral. CFT <3 seconds. Normal hair growth on digits. No edema.  Skin. No lacerations or abrasions bilateral feet.  Musculoskeletal: MMT 5/5 bilateral lower extremities in DF, PF, Inversion and Eversion. Deceased ROM in DF of ankle joint. Right foot with surgical scars first second and third digits and amputation of fourth digit. Fifth digit hammered with hyperkeratosis.  Second digit valgus deformity. No pain to palpation and some edema noted to second digit. Left foot HAV deformity with hammered  digits 2-5. Relates pain but no pain to palpation.  Neurological: Sensation intact to light touch.   Assessment:   1. Hammertoes of both feet   2. S/P foot surgery, right   3. Brachymetatarsia of fourth metatarsal bone   4. Callus of foot       Plan:  Patient was evaluated and treated and all questions answered. -Xrays reviewed Right foot with retained hardware from first MPJ fusion as well as second and third digit hammertoe repairs. Fourth digit amptuation. Left HAV deformity and hammering of 2-5 digits. Brachymetatrsia of bilateral fourth metatarsals. Second digit on right with loosening of screw and non union of second digit arthrodesis.  -Discussed HAV, hammertoes, and foot deformity following surgery and treatment options;conservative and surgical management; risks, benefits, alternatives discussed. All patient's questions answered. -Discussed padding and wide shoe gear.   -Recommend continue with good supportive shoes and inserts.  -Discussed surgical options. Discussed at some point she may need surgery to help with the pain in the second toe including cleaning up joint and refusing and fixating toe as well as repair of fifth digit hammertoe.  Discussed there is not rush and can try some other things first conservatively before doing surgery again. She is in agreement with plan.  -Hyperkeratosis on fifth digit debrided without incident. Toe cap provided to protect from rubbing.  -Patient to return to office  as needed to discuss future surgery vs other options.  Lorenda Peck, DPM

## 2022-05-21 DIAGNOSIS — M25562 Pain in left knee: Secondary | ICD-10-CM | POA: Diagnosis not present

## 2022-06-10 DIAGNOSIS — I420 Dilated cardiomyopathy: Secondary | ICD-10-CM | POA: Diagnosis not present

## 2022-06-10 DIAGNOSIS — E785 Hyperlipidemia, unspecified: Secondary | ICD-10-CM | POA: Diagnosis not present

## 2022-06-10 DIAGNOSIS — I5020 Unspecified systolic (congestive) heart failure: Secondary | ICD-10-CM | POA: Diagnosis not present

## 2022-06-10 DIAGNOSIS — E119 Type 2 diabetes mellitus without complications: Secondary | ICD-10-CM | POA: Diagnosis not present

## 2022-06-10 DIAGNOSIS — I10 Essential (primary) hypertension: Secondary | ICD-10-CM | POA: Diagnosis not present

## 2022-06-25 DIAGNOSIS — I10 Essential (primary) hypertension: Secondary | ICD-10-CM | POA: Diagnosis not present

## 2022-06-25 DIAGNOSIS — E1169 Type 2 diabetes mellitus with other specified complication: Secondary | ICD-10-CM | POA: Diagnosis not present

## 2022-06-25 DIAGNOSIS — M130 Polyarthritis, unspecified: Secondary | ICD-10-CM | POA: Diagnosis not present

## 2022-06-25 DIAGNOSIS — Z0000 Encounter for general adult medical examination without abnormal findings: Secondary | ICD-10-CM | POA: Diagnosis not present

## 2022-06-25 DIAGNOSIS — E782 Mixed hyperlipidemia: Secondary | ICD-10-CM | POA: Diagnosis not present

## 2022-06-25 DIAGNOSIS — E559 Vitamin D deficiency, unspecified: Secondary | ICD-10-CM | POA: Diagnosis not present

## 2022-06-25 DIAGNOSIS — F064 Anxiety disorder due to known physiological condition: Secondary | ICD-10-CM | POA: Diagnosis not present

## 2022-06-29 ENCOUNTER — Other Ambulatory Visit: Payer: Self-pay | Admitting: Family Medicine

## 2022-06-29 DIAGNOSIS — Z1231 Encounter for screening mammogram for malignant neoplasm of breast: Secondary | ICD-10-CM

## 2022-07-02 ENCOUNTER — Ambulatory Visit
Admission: RE | Admit: 2022-07-02 | Discharge: 2022-07-02 | Disposition: A | Discharge status: Home / Self Care | Payer: Medicare HMO | Source: Ambulatory Visit | Attending: Family Medicine | Admitting: Family Medicine

## 2022-07-02 DIAGNOSIS — Z1231 Encounter for screening mammogram for malignant neoplasm of breast: Secondary | ICD-10-CM

## 2022-07-08 ENCOUNTER — Encounter: Payer: Self-pay | Admitting: Podiatry

## 2022-07-14 DIAGNOSIS — M25562 Pain in left knee: Secondary | ICD-10-CM | POA: Diagnosis not present

## 2022-07-15 ENCOUNTER — Telehealth: Payer: Self-pay

## 2022-07-15 DIAGNOSIS — I10 Essential (primary) hypertension: Secondary | ICD-10-CM | POA: Diagnosis not present

## 2022-07-15 DIAGNOSIS — F419 Anxiety disorder, unspecified: Secondary | ICD-10-CM | POA: Diagnosis not present

## 2022-07-15 DIAGNOSIS — K219 Gastro-esophageal reflux disease without esophagitis: Secondary | ICD-10-CM | POA: Diagnosis not present

## 2022-07-15 DIAGNOSIS — K5900 Constipation, unspecified: Secondary | ICD-10-CM | POA: Diagnosis not present

## 2022-07-15 DIAGNOSIS — Z1211 Encounter for screening for malignant neoplasm of colon: Secondary | ICD-10-CM | POA: Diagnosis not present

## 2022-07-15 DIAGNOSIS — E782 Mixed hyperlipidemia: Secondary | ICD-10-CM | POA: Diagnosis not present

## 2022-07-15 DIAGNOSIS — K625 Hemorrhage of anus and rectum: Secondary | ICD-10-CM | POA: Diagnosis not present

## 2022-07-15 NOTE — Patient Outreach (Signed)
  Care Coordination   07/15/2022 Name: [name] MRN: [id number] DOB: 1961/03/26   Care Coordination Outreach Attempts:  An unsuccessful telephone outreach was attempted today to offer the patient information about available care coordination services as a benefit of their health plan.   Follow Up Plan:  Additional outreach attempts will be made to offer the patient care coordination information and services.   Encounter Outcome:  No Answer  Care Coordination Interventions Activated:  No   Care Coordination Interventions:  No, not indicated      Enzo Montgomery, RN,BSN,CCM [facility] Telephonic Care Management Coordinator Direct Phone: [phone] Toll Free: [phone] Fax: [phone]

## 2022-07-16 LAB — COLOGUARD

## 2022-07-21 ENCOUNTER — Telehealth: Payer: Self-pay

## 2022-07-21 DIAGNOSIS — M25562 Pain in left knee: Secondary | ICD-10-CM | POA: Diagnosis not present

## 2022-07-21 NOTE — Patient Outreach (Signed)
  Care Coordination   07/21/2022 Name: [name] MRN: [id number] DOB: 1961-04-20   Care Coordination Outreach Attempts:  A second unsuccessful outreach was attempted today to offer the patient with information about available care coordination services as a benefit of their health plan.     Follow Up Plan:  Additional outreach attempts will be made to offer the patient care coordination information and services.   Encounter Outcome:  No Answer  Care Coordination Interventions Activated:  No   Care Coordination Interventions:  No, not indicated     Enzo Montgomery, RN,BSN,CCM [facility] Telephonic Care Management Coordinator Direct Phone: [phone] Toll Free: [phone] Fax: [phone]

## 2022-07-26 ENCOUNTER — Telehealth: Payer: Self-pay

## 2022-07-26 NOTE — Patient Outreach (Signed)
  Care Coordination   07/26/2022 Name: [name] MRN: [id number] DOB: 09-12-1961   Care Coordination Outreach Attempts:  A third unsuccessful outreach was attempted today to offer the patient with information about available care coordination services as a benefit of their health plan.   Follow Up Plan:  No further outreach attempts will be made at this time. We have been unable to contact the patient to offer or enroll patient in care coordination services  Encounter Outcome:  No Answer  Care Coordination Interventions Activated:  No   Care Coordination Interventions:  No, not indicated      Enzo Montgomery, RN,BSN,CCM [facility] Telephonic Care Management Coordinator Direct Phone: [phone] Toll Free: [phone] Fax: [phone]

## 2022-07-27 DIAGNOSIS — E7800 Pure hypercholesterolemia, unspecified: Secondary | ICD-10-CM | POA: Diagnosis not present

## 2022-07-27 DIAGNOSIS — M25562 Pain in left knee: Secondary | ICD-10-CM | POA: Diagnosis not present

## 2022-07-27 DIAGNOSIS — W108XXA Fall (on) (from) other stairs and steps, initial encounter: Secondary | ICD-10-CM | POA: Diagnosis not present

## 2022-07-27 DIAGNOSIS — I10 Essential (primary) hypertension: Secondary | ICD-10-CM | POA: Diagnosis not present

## 2022-07-29 ENCOUNTER — Ambulatory Visit
Admission: RE | Admit: 2022-07-29 | Discharge: 2022-07-29 | Disposition: A | Discharge status: Home / Self Care | Payer: Medicare HMO | Source: Ambulatory Visit | Attending: Family Medicine | Admitting: Family Medicine

## 2022-07-29 ENCOUNTER — Other Ambulatory Visit: Payer: Self-pay | Admitting: Family Medicine

## 2022-07-29 DIAGNOSIS — W19XXXA Unspecified fall, initial encounter: Secondary | ICD-10-CM

## 2022-07-29 DIAGNOSIS — Z043 Encounter for examination and observation following other accident: Secondary | ICD-10-CM | POA: Diagnosis not present

## 2022-08-04 ENCOUNTER — Encounter (HOSPITAL_COMMUNITY): Payer: Self-pay

## 2022-08-04 ENCOUNTER — Emergency Department (HOSPITAL_COMMUNITY): Payer: Medicare HMO

## 2022-08-04 ENCOUNTER — Emergency Department (HOSPITAL_COMMUNITY)
Admission: EM | Admit: 2022-08-04 | Discharge: 2022-08-05 | Disposition: A | Discharge status: Home / Self Care | Payer: Medicare HMO | Attending: Emergency Medicine | Admitting: Emergency Medicine

## 2022-08-04 DIAGNOSIS — R55 Syncope and collapse: Secondary | ICD-10-CM | POA: Diagnosis not present

## 2022-08-04 DIAGNOSIS — R112 Nausea with vomiting, unspecified: Secondary | ICD-10-CM | POA: Insufficient documentation

## 2022-08-04 DIAGNOSIS — R197 Diarrhea, unspecified: Secondary | ICD-10-CM | POA: Diagnosis not present

## 2022-08-04 DIAGNOSIS — Z79899 Other long term (current) drug therapy: Secondary | ICD-10-CM | POA: Diagnosis not present

## 2022-08-04 DIAGNOSIS — R1030 Lower abdominal pain, unspecified: Secondary | ICD-10-CM | POA: Insufficient documentation

## 2022-08-04 DIAGNOSIS — N189 Chronic kidney disease, unspecified: Secondary | ICD-10-CM | POA: Diagnosis not present

## 2022-08-04 DIAGNOSIS — Z7982 Long term (current) use of aspirin: Secondary | ICD-10-CM | POA: Insufficient documentation

## 2022-08-04 DIAGNOSIS — I130 Hypertensive heart and chronic kidney disease with heart failure and stage 1 through stage 4 chronic kidney disease, or unspecified chronic kidney disease: Secondary | ICD-10-CM | POA: Diagnosis not present

## 2022-08-04 DIAGNOSIS — D72829 Elevated white blood cell count, unspecified: Secondary | ICD-10-CM | POA: Diagnosis not present

## 2022-08-04 DIAGNOSIS — R1084 Generalized abdominal pain: Secondary | ICD-10-CM | POA: Diagnosis not present

## 2022-08-04 DIAGNOSIS — K760 Fatty (change of) liver, not elsewhere classified: Secondary | ICD-10-CM | POA: Diagnosis not present

## 2022-08-04 DIAGNOSIS — I10 Essential (primary) hypertension: Secondary | ICD-10-CM | POA: Diagnosis not present

## 2022-08-04 DIAGNOSIS — I509 Heart failure, unspecified: Secondary | ICD-10-CM | POA: Diagnosis not present

## 2022-08-04 DIAGNOSIS — R7981 Abnormal blood-gas level: Secondary | ICD-10-CM | POA: Insufficient documentation

## 2022-08-04 DIAGNOSIS — R0902 Hypoxemia: Secondary | ICD-10-CM | POA: Diagnosis not present

## 2022-08-04 DIAGNOSIS — E1122 Type 2 diabetes mellitus with diabetic chronic kidney disease: Secondary | ICD-10-CM | POA: Diagnosis not present

## 2022-08-04 DIAGNOSIS — R231 Pallor: Secondary | ICD-10-CM | POA: Diagnosis not present

## 2022-08-04 DIAGNOSIS — N281 Cyst of kidney, acquired: Secondary | ICD-10-CM | POA: Diagnosis not present

## 2022-08-04 LAB — COMPREHENSIVE METABOLIC PANEL
ALT: 28 U/L (ref 0–44)
AST: 27 U/L (ref 15–41)
Albumin: 4.7 g/dL (ref 3.5–5.0)
Alkaline Phosphatase: 77 U/L (ref 38–126)
Anion gap: 13 (ref 5–15)
BUN: 33 mg/dL — ABNORMAL HIGH (ref 8–23)
CO2: 15 mmol/L — ABNORMAL LOW (ref 22–32)
Calcium: 10.4 mg/dL — ABNORMAL HIGH (ref 8.9–10.3)
Chloride: 110 mmol/L (ref 98–111)
Creatinine, Ser: 1.15 mg/dL — ABNORMAL HIGH (ref 0.44–1.00)
GFR, Estimated: 54 mL/min — ABNORMAL LOW (ref >60)
Glucose, Bld: 187 mg/dL — ABNORMAL HIGH (ref 70–99)
Potassium: 3.2 mmol/L — ABNORMAL LOW (ref 3.5–5.1)
Sodium: 138 mmol/L (ref 135–145)
Total Bilirubin: 0.7 mg/dL (ref 0.3–1.2)
Total Protein: 8.7 g/dL — ABNORMAL HIGH (ref 6.5–8.1)

## 2022-08-04 LAB — CBC WITH DIFFERENTIAL/PLATELET
Abs Immature Granulocytes: 0.1 10*3/uL — ABNORMAL HIGH (ref 0.00–0.07)
Basophils Absolute: 0 10*3/uL (ref 0.0–0.1)
Basophils Relative: 0 %
Eosinophils Absolute: 0.1 10*3/uL (ref 0.0–0.5)
Eosinophils Relative: 0 %
HCT: 42.8 % (ref 36.0–46.0)
Hemoglobin: 14.7 g/dL (ref 12.0–15.0)
Immature Granulocytes: 1 %
Lymphocytes Relative: 15 %
Lymphs Abs: 2.6 10*3/uL (ref 0.7–4.0)
MCH: 29.7 pg (ref 26.0–34.0)
MCHC: 34.3 g/dL (ref 30.0–36.0)
MCV: 86.5 fL (ref 80.0–100.0)
Monocytes Absolute: 0.6 10*3/uL (ref 0.1–1.0)
Monocytes Relative: 3 %
Neutro Abs: 14.4 10*3/uL — ABNORMAL HIGH (ref 1.7–7.7)
Neutrophils Relative %: 81 %
Platelets: 325 10*3/uL (ref 150–400)
RBC: 4.95 MIL/uL (ref 3.87–5.11)
RDW: 12.9 % (ref 11.5–15.5)
WBC: 17.8 10*3/uL — ABNORMAL HIGH (ref 4.0–10.5)
nRBC: 0 % (ref 0.0–0.2)

## 2022-08-04 LAB — LIPASE, BLOOD: Lipase: 39 U/L (ref 11–51)

## 2022-08-04 MED ORDER — SODIUM CHLORIDE 0.9 % IV BOLUS: 1000.0000 mL | Freq: Once | INTRAVENOUS | Status: DC

## 2022-08-04 MED ORDER — FENTANYL CITRATE PF 50 MCG/ML IJ SOSY
50.0000 ug | PREFILLED_SYRINGE | Freq: Once | INTRAMUSCULAR | Status: AC
  Administered 2022-08-04: 50 ug via INTRAVENOUS
  Filled 2022-08-04: qty 1

## 2022-08-04 MED ORDER — METOCLOPRAMIDE HCL 5 MG/ML IJ SOLN
10.0000 mg | INTRAMUSCULAR | Status: AC
  Administered 2022-08-04: 10 mg via INTRAVENOUS
  Filled 2022-08-04: qty 2

## 2022-08-04 MED ORDER — SODIUM CHLORIDE 0.9 % IV BOLUS
500.0000 mL | Freq: Once | INTRAVENOUS | Status: AC
  Administered 2022-08-04: 500 mL via INTRAVENOUS

## 2022-08-04 NOTE — ED Provider Notes (Signed)
[facility] DEPT Provider Note   CSN: [id number] Arrival date & time: 08/04/22  2216     History {Add pertinent medical, surgical, social history, OB history to HPI:1} Chief Complaint  Patient presents with   Nausea   Emesis    [name] is a 61 y.o. female.  The history is provided by the patient and medical records.  Emesis Associated symptoms: abdominal pain     61 y.o. F with hx of CHF, CKD, DM, HTN, presenting to the ED for nausea/vomiting and lower abdominal pain.  States ongoing since yesterday, has been persistent.  She started colonoscopy prep 3 days ago for scope planned on Monday.  States initially she was doing ok.  She does admit to drinking some alcohol last night but was already vomiting prior to that.  She reports loose stools.  Denies bloody stools or vomit.  No fever/chills.    Home Medications Prior to Admission medications   Medication Sig Start Date End Date Taking? Authorizing Provider  aspirin 81 MG EC tablet Take 81 mg by mouth daily. Swallow whole.    [provider]  atorvastatin (LIPITOR) 40 MG tablet Take 1 tablet (40 mg total) by mouth daily at 6 PM. 02/15/19   Glendale Chard, MD  baclofen (LIORESAL) 10 MG tablet TAKE 1 TABLET BY MOUTH TWO  TIMES DAILY AS NEEDED 01/16/19   Glendale Chard, MD  carvedilol (COREG) 25 MG tablet Take 1 tablet (25 mg total) by mouth 2 (two) times daily with a meal. 10/16/18   Glendale Chard, MD  Cholecalciferol (VITAMIN D3) 2000 units TABS Take 2,000 Units/day by mouth every morning.    [provider]  CHOLINE-INOSITOL-METHIONINE IM Inject into the muscle.    [provider]  COVID-19 mRNA Vac-TriS, Pfizer, (PFIZER-BIONT COVID-19 VAC-TRIS) SUSP injection Inject into the muscle. 04/09/21   Carlyle Basques, MD  FARXIGA 5 MG TABS tablet  03/21/19   [provider]  FARXIGA 5 MG TABS tablet TAKE 1 TABLET BY MOUTH EVERY DAY 09/05/19   Glendale Chard, MD  folic  acid (FOLVITE) 1 MG tablet Take 1 tablet (1 mg total) by mouth daily. 08/21/18   Glendale Chard, MD  furosemide (LASIX) 40 MG tablet Take by mouth.    [provider]  gabapentin (NEURONTIN) 300 MG capsule Take 300 mg by mouth 2 (two) times daily.    [provider]  lisinopril (PRINIVIL,ZESTRIL) 20 MG tablet Take 1 tablet (20 mg total) by mouth daily. Patient taking differently: Take 20 mg by mouth 2 (two) times daily. 02/15/19   Glendale Chard, MD  LORazepam (ATIVAN) 0.5 MG tablet 1 tab po q 4-6 hours prn or 1 tab po 30 minutes prior to radiation Patient not taking: Reported on 06/18/2019 05/22/19   Hayden Pedro, PA-C  losartan (COZAAR) 100 MG tablet Take 1 tablet by mouth daily. 02/02/21   [provider]  magnesium oxide (MAG-OX) 400 (241.3 Mg) MG tablet Take 1 tablet (400 mg total) by mouth daily. 02/15/19   Glendale Chard, MD  meloxicam (MOBIC) 15 MG tablet Take 15 mg by mouth daily.    [provider]  mupirocin ointment (BACTROBAN) 2 % Apply 1 application topically 3 (three) times daily. 03/21/21   Scot Jun, FNP  nitrofurantoin, macrocrystal-monohydrate, (MACROBID) 100 MG capsule Take 1 capsule (100 mg total) by mouth 2 (two) times daily. 06/21/19   Constant, Peggy, MD  Nutritional Supplements (L-GLUTAMINE/CHOLINE/INOSITOL PO) Take by mouth.    [provider]  omeprazole (PRILOSEC) 40 MG capsule Take 40 mg by mouth daily.    [provider]  pyridOXINE (VITAMIN B-6) 100 MG tablet Take 100 mg by mouth daily.    [provider]  silver sulfADIAZINE (SILVADENE) 1 % cream Apply 1 application topically daily. 06/29/21   Ward, Lenise Arena, PA-C  spironolactone (ALDACTONE) 25 MG tablet Take 25 mg by mouth daily.    [provider]  SYMBICORT 160-4.5 MCG/ACT inhaler SMARTSIG:2 Puff(s) By Mouth Twice Daily 02/27/21   [provider]      Allergies    Codeine    Review of Systems   Review of Systems   Gastrointestinal:  Positive for abdominal pain, nausea and vomiting.  All other systems reviewed and are negative.   Physical Exam Updated Vital Signs BP (!) 153/105   Pulse 75   Resp (!) 37   Ht '5\' 3"'$  (1.6 m)   Wt 59 kg   SpO2 98%   BMI 23.03 kg/m   Physical Exam Vitals and nursing note reviewed.  Constitutional:      Appearance: She is well-developed.     Comments: Appears uncomfortable, writhing in bed  HENT:     Head: Normocephalic and atraumatic.  Eyes:     Conjunctiva/sclera: Conjunctivae normal.     Pupils: Pupils are equal, round, and reactive to light.  Cardiovascular:     Rate and Rhythm: Normal rate and regular rhythm.     Heart sounds: Normal heart sounds.  Pulmonary:     Effort: Pulmonary effort is normal.     Breath sounds: Normal breath sounds.  Abdominal:     General: Bowel sounds are normal.     Palpations: Abdomen is soft.     Tenderness: There is generalized abdominal tenderness.     Comments: Holding her abdomen, generalized tenderness  Musculoskeletal:        General: Normal range of motion.     Cervical back: Normal range of motion.  Skin:    General: Skin is warm and dry.  Neurological:     Mental Status: She is alert and oriented to person, place, and time.     ED Results / Procedures / Treatments   Labs (all labs ordered are listed, but only abnormal results are displayed) Labs Reviewed  CBC WITH DIFFERENTIAL/PLATELET  COMPREHENSIVE METABOLIC PANEL  LIPASE, BLOOD  URINALYSIS, ROUTINE W REFLEX MICROSCOPIC    EKG None  Radiology No results found.  Procedures Procedures  {Document cardiac monitor, telemetry assessment procedure when appropriate:1}  Medications Ordered in ED Medications  metoCLOPramide (REGLAN) injection 10 mg (has no administration in time range)  fentaNYL (SUBLIMAZE) injection 50 mcg (has no administration in time range)  sodium chloride 0.9 % bolus 1,000 mL (has no administration in time range)    ED  Course/ Medical Decision Making/ A&P                           Medical Decision Making Amount and/or Complexity of Data Reviewed Labs: ordered. Radiology: ordered.  Risk Prescription drug management.   ***  {Document critical care time when appropriate:1} {Document review of labs and clinical decision tools ie heart score, Chads2Vasc2 etc:1}  {Document your independent review of radiology images, and any outside records:1} {Document your discussion with family members, caretakers, and with consultants:1} {Document social determinants of health affecting pt's care:1} {Document your decision making why or why not admission, treatments were needed:1} Final Clinical  Impression(s) / ED Diagnoses Final diagnoses:  None    Rx / DC Orders ED Discharge Orders     None

## 2022-08-04 NOTE — ED Triage Notes (Signed)
Pt from home BIB [facility] for n/v since yesterday, colonoscopy prep stared 3 days. 4 mg Zofran PTA & 376m NS. Zofran not helping n/v.10/10 abd pain. VSS, pt alert on arrival. Denies fever. Drank alcohol last night

## 2022-08-04 NOTE — ED Notes (Signed)
Pt resting, pt appears more at ease with medication, NAD noted, observed even RR and unlabored, side rails up x2 for safety, plan of care ongoing, pt expresses no needs or concerns at this time, call light within reach, no further concerns as of present. Friends at the bedside

## 2022-08-05 ENCOUNTER — Emergency Department (HOSPITAL_COMMUNITY): Payer: Medicare HMO

## 2022-08-05 DIAGNOSIS — K760 Fatty (change of) liver, not elsewhere classified: Secondary | ICD-10-CM | POA: Diagnosis not present

## 2022-08-05 DIAGNOSIS — N281 Cyst of kidney, acquired: Secondary | ICD-10-CM | POA: Diagnosis not present

## 2022-08-05 MED ORDER — LORAZEPAM 2 MG/ML IJ SOLN
1.0000 mg | Freq: Once | INTRAMUSCULAR | Status: AC
  Administered 2022-08-05: 1 mg via INTRAVENOUS
  Filled 2022-08-05: qty 1

## 2022-08-05 MED ORDER — PROMETHAZINE HCL 25 MG RE SUPP: 25.0000 mg | Freq: Four times a day (QID) | RECTAL | 0 refills | Status: AC | PRN

## 2022-08-05 MED ORDER — ONDANSETRON 4 MG PO TBDP: 4.0000 mg | ORAL_TABLET | Freq: Three times a day (TID) | ORAL | 0 refills | Status: DC | PRN

## 2022-08-05 MED ORDER — IOHEXOL 300 MG/ML  SOLN
80.0000 mL | Freq: Once | INTRAMUSCULAR | Status: AC | PRN
  Administered 2022-08-05: 80 mL via INTRAVENOUS

## 2022-08-05 NOTE — ED Notes (Signed)
Pt given ice water to sip on

## 2022-08-05 NOTE — Discharge Instructions (Signed)
Take the prescribed medication as directed-- I would start with the zofran, if that is not working then can try the suppository. Make sure to try and stay hydrated with lots of fluids. Follow-up with your primary care doctor. Return to the ED for new or worsening symptoms.

## 2022-08-05 NOTE — ED Notes (Signed)
Pt with n/v at this time, secure message sent to [facility] requesting additional Reglan IV, awaiting response, refer to [facility]

## 2022-08-05 NOTE — ED Notes (Signed)
Pt ambulated to BR w/o assist

## 2022-08-05 NOTE — ED Notes (Signed)
Pt appears to be sleeping, observe even RR and unlabored, NAD noted, side rails up x2 for safety, plan of care ongoing, call light within reach, no further concerns as of present.   

## 2022-08-09 DIAGNOSIS — K635 Polyp of colon: Secondary | ICD-10-CM | POA: Diagnosis not present

## 2022-08-09 DIAGNOSIS — Z1211 Encounter for screening for malignant neoplasm of colon: Secondary | ICD-10-CM | POA: Diagnosis not present

## 2022-08-09 DIAGNOSIS — D122 Benign neoplasm of ascending colon: Secondary | ICD-10-CM | POA: Diagnosis not present

## 2022-08-09 DIAGNOSIS — D125 Benign neoplasm of sigmoid colon: Secondary | ICD-10-CM | POA: Diagnosis not present

## 2022-08-09 DIAGNOSIS — D123 Benign neoplasm of transverse colon: Secondary | ICD-10-CM | POA: Diagnosis not present

## 2022-08-09 DIAGNOSIS — K625 Hemorrhage of anus and rectum: Secondary | ICD-10-CM | POA: Diagnosis not present

## 2022-08-13 ENCOUNTER — Other Ambulatory Visit (HOSPITAL_BASED_OUTPATIENT_CLINIC_OR_DEPARTMENT_OTHER): Payer: Self-pay

## 2022-08-13 DIAGNOSIS — M25562 Pain in left knee: Secondary | ICD-10-CM | POA: Diagnosis not present

## 2022-08-13 MED ORDER — COVID-19 MRNA 2023-2024 VACCINE (COMIRNATY) 0.3 ML INJECTION
INTRAMUSCULAR | 0 refills | Status: DC
  Filled 2022-08-13: qty 0.3, 1d supply, fill #0

## 2022-08-25 ENCOUNTER — Ambulatory Visit: Payer: Medicare HMO | Admitting: Podiatry

## 2022-09-08 ENCOUNTER — Ambulatory Visit (INDEPENDENT_AMBULATORY_CARE_PROVIDER_SITE_OTHER): Payer: Medicare HMO | Admitting: Podiatrist

## 2022-09-08 ENCOUNTER — Encounter: Payer: Self-pay | Admitting: Podiatrist

## 2022-09-08 DIAGNOSIS — Z9889 Other specified postprocedural states: Secondary | ICD-10-CM | POA: Diagnosis not present

## 2022-09-08 DIAGNOSIS — M2041 Other hammer toe(s) (acquired), right foot: Secondary | ICD-10-CM | POA: Diagnosis not present

## 2022-09-08 NOTE — Progress Notes (Signed)
Chief Complaint  Patient presents with   Toe Pain     HPI: Patient is 61 y.o. female who presents today for pain and swelling right second toe. She relates her third toe also curls under and the fifth toe on the right foot is starting to contract back.  She has had surgery in the past with Dr Doran Durand where he fused her first mpj, put a screw in her second toe and amputated her fourth toe  she relates she had another surgery to correct the second and third toes again, but this was not successful.  She is interested in seeing if any other options exist for these toes or if she needs further surgery to fix.     Allergies  Allergen Reactions   Codeine Nausea And Vomiting    Review of systems is negative except as noted in the HPI.  Denies nausea/ vomiting/ fevers/ chills or night sweats.   Denies difficulty breathing, denies calf pain or tenderness  Physical Exam  Patient is awake, alert, and oriented x 3.  In no acute distress.    Vascular status is intact with palpable pedal pulses DP and PT bilateral and capillary refill time less than 3 seconds right   No edema or erythema noted.   Neurological exam reveals epicritic and protective sensation grossly intact right   Dermatological exam reveals skin is supple and dry right. Incision sites are healed well with no increased scarring noted.   Musculoskeletal exam: first mpj has been fused.  Second toe is plantarflexed slightly and straight. A minimal amount of swelling present. Third toe right is contracted.  Fifth toe right also contracted.     Assessment:   ICD-10-CM   1. Acquired hammertoe of right foot  M20.41     2. History of foot surgery  Z98.890        Plan: Discussed her concerns with her toes and discussed that since she has already had surgery on the toes, the only real option to help them is further surgery.  Discussed I agree with Dr. Harley Hallmark findings and recommendations for surgical correction.  Also discussed that Dr.  Blenda Mounts will be back seeing patients in January.  Jacklin is fine to wait on Dr. Blenda Mounts-  will set her up for a surgical consult to discuss options further with Dr. Blenda Mounts.  She will call if any questions arise in the meantime.

## 2022-09-15 DIAGNOSIS — E785 Hyperlipidemia, unspecified: Secondary | ICD-10-CM | POA: Diagnosis not present

## 2022-09-15 DIAGNOSIS — I5020 Unspecified systolic (congestive) heart failure: Secondary | ICD-10-CM | POA: Diagnosis not present

## 2022-09-15 DIAGNOSIS — I10 Essential (primary) hypertension: Secondary | ICD-10-CM | POA: Diagnosis not present

## 2022-09-15 DIAGNOSIS — I420 Dilated cardiomyopathy: Secondary | ICD-10-CM | POA: Diagnosis not present

## 2022-09-16 DIAGNOSIS — M4602 Spinal enthesopathy, cervical region: Secondary | ICD-10-CM | POA: Diagnosis not present

## 2022-09-16 DIAGNOSIS — I10 Essential (primary) hypertension: Secondary | ICD-10-CM | POA: Diagnosis not present

## 2022-09-16 DIAGNOSIS — Z113 Encounter for screening for infections with a predominantly sexual mode of transmission: Secondary | ICD-10-CM | POA: Diagnosis not present

## 2022-09-16 DIAGNOSIS — E1169 Type 2 diabetes mellitus with other specified complication: Secondary | ICD-10-CM | POA: Diagnosis not present

## 2022-09-16 DIAGNOSIS — M130 Polyarthritis, unspecified: Secondary | ICD-10-CM | POA: Diagnosis not present

## 2022-09-16 DIAGNOSIS — Z118 Encounter for screening for other infectious and parasitic diseases: Secondary | ICD-10-CM | POA: Diagnosis not present

## 2022-10-19 DIAGNOSIS — H10413 Chronic giant papillary conjunctivitis, bilateral: Secondary | ICD-10-CM | POA: Diagnosis not present

## 2022-10-19 DIAGNOSIS — H31012 Macula scars of posterior pole (postinflammatory) (post-traumatic), left eye: Secondary | ICD-10-CM | POA: Diagnosis not present

## 2022-10-19 DIAGNOSIS — H35342 Macular cyst, hole, or pseudohole, left eye: Secondary | ICD-10-CM | POA: Diagnosis not present

## 2022-10-19 DIAGNOSIS — H25813 Combined forms of age-related cataract, bilateral: Secondary | ICD-10-CM | POA: Diagnosis not present

## 2022-10-19 DIAGNOSIS — H40012 Open angle with borderline findings, low risk, left eye: Secondary | ICD-10-CM | POA: Diagnosis not present

## 2022-10-19 DIAGNOSIS — E119 Type 2 diabetes mellitus without complications: Secondary | ICD-10-CM | POA: Diagnosis not present

## 2022-10-19 DIAGNOSIS — H04123 Dry eye syndrome of bilateral lacrimal glands: Secondary | ICD-10-CM | POA: Diagnosis not present

## 2022-11-03 DIAGNOSIS — R059 Cough, unspecified: Secondary | ICD-10-CM | POA: Diagnosis not present

## 2022-11-03 DIAGNOSIS — J1189 Influenza due to unidentified influenza virus with other manifestations: Secondary | ICD-10-CM | POA: Diagnosis not present

## 2022-11-03 DIAGNOSIS — J1183 Influenza due to unidentified influenza virus with otitis media: Secondary | ICD-10-CM | POA: Diagnosis not present

## 2022-11-23 ENCOUNTER — Ambulatory Visit: Payer: Medicare HMO | Admitting: Podiatry

## 2022-11-23 DIAGNOSIS — Z113 Encounter for screening for infections with a predominantly sexual mode of transmission: Secondary | ICD-10-CM | POA: Diagnosis not present

## 2022-11-23 DIAGNOSIS — Z124 Encounter for screening for malignant neoplasm of cervix: Secondary | ICD-10-CM | POA: Diagnosis not present

## 2022-11-23 DIAGNOSIS — Z01411 Encounter for gynecological examination (general) (routine) with abnormal findings: Secondary | ICD-10-CM | POA: Diagnosis not present

## 2022-11-23 DIAGNOSIS — Z01419 Encounter for gynecological examination (general) (routine) without abnormal findings: Secondary | ICD-10-CM | POA: Diagnosis not present

## 2022-11-23 DIAGNOSIS — N898 Other specified noninflammatory disorders of vagina: Secondary | ICD-10-CM | POA: Diagnosis not present

## 2022-11-23 DIAGNOSIS — N760 Acute vaginitis: Secondary | ICD-10-CM | POA: Diagnosis not present

## 2022-11-23 DIAGNOSIS — Z90711 Acquired absence of uterus with remaining cervical stump: Secondary | ICD-10-CM | POA: Diagnosis not present

## 2022-11-29 ENCOUNTER — Ambulatory Visit (INDEPENDENT_AMBULATORY_CARE_PROVIDER_SITE_OTHER): Payer: Medicare HMO | Admitting: Podiatry

## 2022-11-29 DIAGNOSIS — Z91199 Patient's noncompliance with other medical treatment and regimen due to unspecified reason: Secondary | ICD-10-CM

## 2022-11-29 NOTE — Progress Notes (Signed)
No show

## 2022-12-16 DIAGNOSIS — T1490XA Injury, unspecified, initial encounter: Secondary | ICD-10-CM | POA: Diagnosis not present

## 2022-12-16 DIAGNOSIS — Z6824 Body mass index (BMI) 24.0-24.9, adult: Secondary | ICD-10-CM | POA: Diagnosis not present

## 2022-12-16 DIAGNOSIS — I10 Essential (primary) hypertension: Secondary | ICD-10-CM | POA: Diagnosis not present

## 2022-12-16 DIAGNOSIS — E1169 Type 2 diabetes mellitus with other specified complication: Secondary | ICD-10-CM | POA: Diagnosis not present

## 2022-12-16 DIAGNOSIS — J410 Simple chronic bronchitis: Secondary | ICD-10-CM | POA: Diagnosis not present

## 2022-12-16 DIAGNOSIS — R21 Rash and other nonspecific skin eruption: Secondary | ICD-10-CM | POA: Diagnosis not present

## 2022-12-16 DIAGNOSIS — R059 Cough, unspecified: Secondary | ICD-10-CM | POA: Diagnosis not present

## 2022-12-28 DIAGNOSIS — H6121 Impacted cerumen, right ear: Secondary | ICD-10-CM | POA: Diagnosis not present

## 2022-12-28 DIAGNOSIS — J029 Acute pharyngitis, unspecified: Secondary | ICD-10-CM | POA: Diagnosis not present

## 2022-12-28 DIAGNOSIS — D333 Benign neoplasm of cranial nerves: Secondary | ICD-10-CM | POA: Diagnosis not present

## 2023-01-07 ENCOUNTER — Other Ambulatory Visit: Payer: Self-pay

## 2023-01-07 ENCOUNTER — Encounter (HOSPITAL_COMMUNITY): Payer: Self-pay

## 2023-01-07 ENCOUNTER — Emergency Department (HOSPITAL_COMMUNITY): Payer: 59

## 2023-01-07 ENCOUNTER — Emergency Department (HOSPITAL_COMMUNITY)
Admission: EM | Admit: 2023-01-07 | Discharge: 2023-01-07 | Disposition: A | Discharge status: Home / Self Care | Payer: 59 | Attending: Emergency Medicine | Admitting: Emergency Medicine

## 2023-01-07 DIAGNOSIS — I509 Heart failure, unspecified: Secondary | ICD-10-CM | POA: Insufficient documentation

## 2023-01-07 DIAGNOSIS — Z7984 Long term (current) use of oral hypoglycemic drugs: Secondary | ICD-10-CM | POA: Diagnosis not present

## 2023-01-07 DIAGNOSIS — N189 Chronic kidney disease, unspecified: Secondary | ICD-10-CM | POA: Diagnosis not present

## 2023-01-07 DIAGNOSIS — Z79899 Other long term (current) drug therapy: Secondary | ICD-10-CM | POA: Diagnosis not present

## 2023-01-07 DIAGNOSIS — K2920 Alcoholic gastritis without bleeding: Secondary | ICD-10-CM | POA: Diagnosis not present

## 2023-01-07 DIAGNOSIS — R197 Diarrhea, unspecified: Secondary | ICD-10-CM

## 2023-01-07 DIAGNOSIS — E1122 Type 2 diabetes mellitus with diabetic chronic kidney disease: Secondary | ICD-10-CM | POA: Diagnosis not present

## 2023-01-07 DIAGNOSIS — R7989 Other specified abnormal findings of blood chemistry: Secondary | ICD-10-CM | POA: Diagnosis not present

## 2023-01-07 DIAGNOSIS — Z7982 Long term (current) use of aspirin: Secondary | ICD-10-CM | POA: Insufficient documentation

## 2023-01-07 DIAGNOSIS — I130 Hypertensive heart and chronic kidney disease with heart failure and stage 1 through stage 4 chronic kidney disease, or unspecified chronic kidney disease: Secondary | ICD-10-CM | POA: Insufficient documentation

## 2023-01-07 DIAGNOSIS — R112 Nausea with vomiting, unspecified: Secondary | ICD-10-CM | POA: Diagnosis not present

## 2023-01-07 DIAGNOSIS — R9431 Abnormal electrocardiogram [ECG] [EKG]: Secondary | ICD-10-CM | POA: Diagnosis not present

## 2023-01-07 DIAGNOSIS — R1111 Vomiting without nausea: Secondary | ICD-10-CM | POA: Diagnosis not present

## 2023-01-07 LAB — LIPASE, BLOOD: Lipase: 49 U/L (ref 11–51)

## 2023-01-07 LAB — COMPREHENSIVE METABOLIC PANEL
ALT: 21 U/L (ref 0–44)
AST: 23 U/L (ref 15–41)
Albumin: 4.8 g/dL (ref 3.5–5.0)
Alkaline Phosphatase: 64 U/L (ref 38–126)
Anion gap: 12 (ref 5–15)
BUN: 40 mg/dL — ABNORMAL HIGH (ref 8–23)
CO2: 18 mmol/L — ABNORMAL LOW (ref 22–32)
Calcium: 9.4 mg/dL (ref 8.9–10.3)
Chloride: 108 mmol/L (ref 98–111)
Creatinine, Ser: 1.02 mg/dL — ABNORMAL HIGH (ref 0.44–1.00)
GFR, Estimated: >60 mL/min (ref >60)
Glucose, Bld: 125 mg/dL — ABNORMAL HIGH (ref 70–99)
Potassium: 3.5 mmol/L (ref 3.5–5.1)
Sodium: 138 mmol/L (ref 135–145)
Total Bilirubin: 0.8 mg/dL (ref 0.3–1.2)
Total Protein: 8.3 g/dL — ABNORMAL HIGH (ref 6.5–8.1)

## 2023-01-07 LAB — URINALYSIS, ROUTINE W REFLEX MICROSCOPIC
Bilirubin Urine: NEGATIVE
Glucose, UA: >=500 mg/dL — AB
Hgb urine dipstick: NEGATIVE
Ketones, ur: 5 mg/dL — AB
Leukocytes,Ua: NEGATIVE
Nitrite: NEGATIVE
Protein, ur: NEGATIVE mg/dL
Specific Gravity, Urine: 1.018 (ref 1.005–1.030)
pH: 5 (ref 5.0–8.0)

## 2023-01-07 LAB — CBC WITH DIFFERENTIAL/PLATELET
Abs Immature Granulocytes: 0.03 10*3/uL (ref 0.00–0.07)
Abs Immature Granulocytes: 0.1 10*3/uL — ABNORMAL HIGH (ref 0.00–0.07)
Basophils Absolute: 0 10*3/uL (ref 0.0–0.1)
Basophils Absolute: 0.1 10*3/uL (ref 0.0–0.1)
Basophils Relative: 1 %
Basophils Relative: 0 %
Eosinophils Absolute: 0.1 10*3/uL (ref 0.0–0.5)
Eosinophils Absolute: 0 10*3/uL (ref 0.0–0.5)
Eosinophils Relative: 1 %
Eosinophils Relative: 0 %
HCT: 40.4 % (ref 36.0–46.0)
HCT: 44.7 % (ref 36.0–46.0)
Hemoglobin: 12.9 g/dL (ref 12.0–15.0)
Hemoglobin: 14.4 g/dL (ref 12.0–15.0)
Immature Granulocytes: 0 %
Immature Granulocytes: 1 %
Lymphocytes Relative: 15 %
Lymphocytes Relative: 7 %
Lymphs Abs: 1.1 10*3/uL (ref 0.7–4.0)
Lymphs Abs: 1.3 10*3/uL (ref 0.7–4.0)
MCH: 28.1 pg (ref 26.0–34.0)
MCH: 28.5 pg (ref 26.0–34.0)
MCHC: 31.9 g/dL (ref 30.0–36.0)
MCHC: 32.2 g/dL (ref 30.0–36.0)
MCV: 88 fL (ref 80.0–100.0)
MCV: 88.3 fL (ref 80.0–100.0)
Monocytes Absolute: 0.6 10*3/uL (ref 0.1–1.0)
Monocytes Absolute: 0.5 10*3/uL (ref 0.1–1.0)
Monocytes Relative: 9 %
Monocytes Relative: 2 %
Neutro Abs: 5.5 10*3/uL (ref 1.7–7.7)
Neutro Abs: 17.8 10*3/uL — ABNORMAL HIGH (ref 1.7–7.7)
Neutrophils Relative %: 74 %
Neutrophils Relative %: 90 %
Platelets: 270 10*3/uL (ref 150–400)
Platelets: 264 10*3/uL (ref 150–400)
RBC: 4.59 MIL/uL (ref 3.87–5.11)
RBC: 5.06 MIL/uL (ref 3.87–5.11)
RDW: 13.2 % (ref 11.5–15.5)
RDW: 12.6 % (ref 11.5–15.5)
WBC: 7.4 10*3/uL (ref 4.0–10.5)
WBC: 19.8 10*3/uL — ABNORMAL HIGH (ref 4.0–10.5)
nRBC: 0 % (ref 0.0–0.2)
nRBC: 0 % (ref 0.0–0.2)

## 2023-01-07 MED ORDER — SODIUM CHLORIDE 0.9 % IV BOLUS
1000.0000 mL | Freq: Once | INTRAVENOUS | Status: AC
  Administered 2023-01-07: 1000 mL via INTRAVENOUS

## 2023-01-07 MED ORDER — ALUM & MAG HYDROXIDE-SIMETH 200-200-20 MG/5ML PO SUSP
30.0000 mL | Freq: Once | ORAL | Status: AC
  Administered 2023-01-07: 30 mL via ORAL
  Filled 2023-01-07: qty 30

## 2023-01-07 MED ORDER — METOCLOPRAMIDE HCL 5 MG/ML IJ SOLN
10.0000 mg | Freq: Once | INTRAMUSCULAR | Status: AC
  Administered 2023-01-07: 10 mg via INTRAVENOUS
  Filled 2023-01-07: qty 2

## 2023-01-07 MED ORDER — HYDROMORPHONE HCL 1 MG/ML IJ SOLN
0.5000 mg | Freq: Once | INTRAMUSCULAR | Status: AC
  Administered 2023-01-07: 0.5 mg via INTRAVENOUS
  Filled 2023-01-07: qty 1

## 2023-01-07 MED ORDER — LIDOCAINE VISCOUS HCL 2 % MT SOLN
15.0000 mL | Freq: Once | OROMUCOSAL | Status: AC
  Administered 2023-01-07: 15 mL via ORAL
  Filled 2023-01-07: qty 15

## 2023-01-07 MED ORDER — FAMOTIDINE 20 MG PO TABS: 20.0000 mg | ORAL_TABLET | Freq: Two times a day (BID) | ORAL | 0 refills | Status: AC

## 2023-01-07 MED ORDER — METOCLOPRAMIDE HCL 10 MG PO TABS: 10.0000 mg | ORAL_TABLET | Freq: Four times a day (QID) | ORAL | 0 refills | Status: AC

## 2023-01-07 MED ORDER — ONDANSETRON HCL 4 MG/2ML IJ SOLN
4.0000 mg | Freq: Once | INTRAMUSCULAR | Status: AC
  Administered 2023-01-07: 4 mg via INTRAVENOUS
  Filled 2023-01-07: qty 2

## 2023-01-07 MED ORDER — PANTOPRAZOLE SODIUM 40 MG IV SOLR
40.0000 mg | Freq: Once | INTRAVENOUS | Status: AC
  Administered 2023-01-07: 40 mg via INTRAVENOUS
  Filled 2023-01-07: qty 10

## 2023-01-07 MED ORDER — SODIUM CHLORIDE 0.9 % IV SOLN: 12.5000 mg | Freq: Four times a day (QID) | INTRAVENOUS | Status: DC | PRN

## 2023-01-07 MED ORDER — ONDANSETRON 8 MG PO TBDP: 8.0000 mg | ORAL_TABLET | Freq: Three times a day (TID) | ORAL | 0 refills | Status: DC | PRN

## 2023-01-07 NOTE — ED Notes (Signed)
Pt tolerating PO fluid and food at this time without N/V.

## 2023-01-07 NOTE — ED Triage Notes (Addendum)
Pt coming from home. Pt coming today for nausea/vomiting/diarrhea. Pt does endorse drinking last night and states she feels hung over. Denies pain, blood noted in emesis.

## 2023-01-07 NOTE — Discharge Instructions (Signed)
Please read and follow all provided instructions.  Your diagnoses today include:  1. Acute alcoholic gastritis without hemorrhage   2. Nausea vomiting and diarrhea     Tests performed today include: Blood cell counts and platelets: Infection fighting cell count was high Kidney and liver function tests Pancreas function test (called lipase) Urine test to look for infection Vital signs. See below for your results today.   Medications prescribed:  Reglan (metoclopramide) - for nausea and vomiting  Pepcid (famotidine) - antihistamine  You can find this medication over-the-counter.   DO NOT exceed:  '20mg'$  Pepcid every 12 hours  Take any prescribed medications only as directed.  Home care instructions:  Follow any educational materials contained in this packet.  Follow-up instructions: Please follow-up with your primary care provider in the next 3 days for further evaluation of your symptoms.    Return instructions:  SEEK IMMEDIATE MEDICAL ATTENTION IF: The pain does not go away or becomes severe  A temperature above 101F develops  Repeated vomiting occurs (multiple episodes)  The pain becomes localized to portions of the abdomen. The right side could possibly be appendicitis. In an adult, the left lower portion of the abdomen could be colitis or diverticulitis.  Blood is being passed in stools or vomit (bright red or black tarry stools)  You develop chest pain, difficulty breathing, dizziness or fainting, or become confused, poorly responsive, or inconsolable (young children) If you have any other emergent concerns regarding your health  Additional Information: Abdominal (belly) pain can be caused by many things. Your caregiver performed an examination and possibly ordered blood/urine tests and imaging (CT scan, x-rays, ultrasound). Many cases can be observed and treated at home after initial evaluation in the emergency department. Even though you are being discharged home,  abdominal pain can be unpredictable. Therefore, you need a repeated exam if your pain does not resolve, returns, or worsens. Most patients with abdominal pain don't have to be admitted to the hospital or have surgery, but serious problems like appendicitis and gallbladder attacks can start out as nonspecific pain. Many abdominal conditions cannot be diagnosed in one visit, so follow-up evaluations are very important.  Your vital signs today were: BP (!) 147/108   Pulse 98   Temp 97.8 F (36.6 C) (Oral)   Resp (!) 30   Ht '5\' 3"'$  (1.6 m)   Wt 62.6 kg   SpO2 97%   BMI 24.45 kg/m  If your blood pressure (bp) was elevated above 135/85 this visit, please have this repeated by your doctor within one month. --------------

## 2023-01-07 NOTE — ED Provider Notes (Signed)
[place] EMERGENCY DEPARTMENT AT [facility] Provider Note   CSN: [id number] Arrival date & time: 01/07/23  1104     History {Add pertinent medical, surgical, social history, OB history to HPI:1} Chief Complaint  Patient presents with  . Emesis  . Nausea    [name] is a 62 y.o. female.  Patient with history of CHF, CKD, DM, HTN --presents to the emergency department today for evaluation of nausea, vomiting, and diarrhea starting around 6 AM.  Patient states "I think I drink too much liquor".  She is a daily drinker but drank more than her usual amount yesterday.  She states that she was drinking screwdrivers.  She reports epigastric abdominal pain with vomiting.  Also diarrhea without blood.  No known sick contacts.  No fevers.  No dysuria.  She denies heavy NSAID use.       Home Medications Prior to Admission medications   Medication Sig Start Date End Date Taking? Authorizing Provider  amLODipine (NORVASC) 5 MG tablet Take 5 mg by mouth daily. 06/09/22   [provider]  aspirin 81 MG EC tablet Take 81 mg by mouth daily. Swallow whole. Patient not taking: Reported on 09/08/2022    [provider]  atorvastatin (LIPITOR) 40 MG tablet Take 1 tablet (40 mg total) by mouth daily at 6 PM. 02/15/19   Glendale Chard, MD  baclofen (LIORESAL) 10 MG tablet TAKE 1 TABLET BY MOUTH TWO  TIMES DAILY AS NEEDED Patient not taking: Reported on 09/08/2022 01/16/19   Glendale Chard, MD  carvedilol (COREG) 25 MG tablet Take 1 tablet (25 mg total) by mouth 2 (two) times daily with a meal. 10/16/18   Glendale Chard, MD  Cholecalciferol (VITAMIN D3) 2000 units TABS Take 2,000 Units/day by mouth every morning.    [provider]  CHOLINE-INOSITOL-METHIONINE IM Inject into the muscle. Patient not taking: Reported on 09/08/2022    [provider]  citalopram (CELEXA) 40 MG tablet Take 40 mg by mouth daily. Patient not taking: Reported on 09/08/2022  07/25/22   [provider]  CLENPIQ 10-3.5-12 MG-GM -GM/175ML SOLN SMARTSIG:175 Milliliter(s) By Mouth Twice Daily Patient not taking: Reported on 09/08/2022 07/29/22   [provider]  COVID-19 mRNA Vac-TriS, Pfizer, (PFIZER-BIONT COVID-19 VAC-TRIS) SUSP injection Inject into the muscle. Patient not taking: Reported on 09/08/2022 04/09/21   Carlyle Basques, MD  COVID-19 mRNA vaccine [phone] (COMIRNATY) SUSP injection Inject into the muscle. Patient not taking: Reported on 09/08/2022 08/13/22     FARXIGA 5 MG TABS tablet  03/21/19   [provider]  FARXIGA 5 MG TABS tablet TAKE 1 TABLET BY MOUTH EVERY DAY 09/05/19   Glendale Chard, MD  folic acid (FOLVITE) 1 MG tablet Take 1 tablet (1 mg total) by mouth daily. 08/21/18   Glendale Chard, MD  furosemide (LASIX) 40 MG tablet Take by mouth. Patient not taking: Reported on 09/08/2022    [provider]  gabapentin (NEURONTIN) 300 MG capsule Take 300 mg by mouth 2 (two) times daily. Patient not taking: Reported on 09/08/2022    [provider]  lisinopril (PRINIVIL,ZESTRIL) 20 MG tablet Take 1 tablet (20 mg total) by mouth daily. Patient taking differently: Take 20 mg by mouth 2 (two) times daily. 02/15/19   Glendale Chard, MD  LORazepam (ATIVAN) 0.5 MG tablet 1 tab po q 4-6 hours prn or 1 tab po 30 minutes prior to radiation Patient not taking: Reported on 09/08/2022 05/22/19   Hayden Pedro, PA-C  losartan (COZAAR) 100 MG tablet Take 1 tablet by mouth daily. Patient not taking: Reported on 09/08/2022 02/02/21   [provider]  magnesium oxide (MAG-OX) 400 (241.3 Mg) MG tablet Take 1 tablet (400 mg total) by mouth daily. Patient not taking: Reported on 09/08/2022 02/15/19   Glendale Chard, MD  meloxicam (MOBIC) 15 MG tablet Take 15 mg by mouth daily.    [provider]  mupirocin ointment (BACTROBAN) 2 % Apply 1 application topically 3 (three) times daily. 03/21/21   Scot Jun, NP   nitrofurantoin, macrocrystal-monohydrate, (MACROBID) 100 MG capsule Take 1 capsule (100 mg total) by mouth 2 (two) times daily. Patient not taking: Reported on 09/08/2022 06/21/19   Constant, Peggy, MD  Nutritional Supplements (L-GLUTAMINE/CHOLINE/INOSITOL PO) Take by mouth. Patient not taking: Reported on 09/08/2022    [provider]  omeprazole (PRILOSEC) 40 MG capsule Take 40 mg by mouth daily.    [provider]  ondansetron (ZOFRAN-ODT) 4 MG disintegrating tablet Take 1 tablet (4 mg total) by mouth every 8 (eight) hours as needed for nausea. Patient not taking: Reported on 09/08/2022 08/05/22   Larene Pickett, PA-C  promethazine (PHENERGAN) 25 MG suppository Place 1 suppository (25 mg total) rectally every 6 (six) hours as needed for nausea or vomiting. 08/05/22   Larene Pickett, PA-C  pyridOXINE (VITAMIN B-6) 100 MG tablet Take 100 mg by mouth daily.    [provider]  silver sulfADIAZINE (SILVADENE) 1 % cream Apply 1 application topically daily. Patient not taking: Reported on 09/08/2022 06/29/21   Ward, Lenise Arena, PA-C  spironolactone (ALDACTONE) 25 MG tablet Take 25 mg by mouth daily.    [provider]  SYMBICORT 160-4.5 MCG/ACT inhaler SMARTSIG:2 Puff(s) By Mouth Twice Daily 02/27/21   [provider]      Allergies    Codeine    Review of Systems   Review of Systems  Physical Exam Updated Vital Signs BP (!) 169/118   Pulse 70   Temp 97.7 F (36.5 C)   Resp 18   SpO2 100%  Physical Exam Vitals and nursing note reviewed.  Constitutional:      General: She is in acute distress.     Appearance: She is well-developed.     Comments: Patient somewhat uncomfortable appearing  HENT:     Head: Normocephalic and atraumatic.     Right Ear: External ear normal.     Left Ear: External ear normal.     Nose: Nose normal.  Eyes:     Conjunctiva/sclera: Conjunctivae normal.  Cardiovascular:     Rate and Rhythm: Normal rate and regular  rhythm.     Heart sounds: No murmur heard. Pulmonary:     Effort: No respiratory distress.     Breath sounds: No wheezing, rhonchi or rales.  Abdominal:     Palpations: Abdomen is soft.     Tenderness: There is abdominal tenderness. There is no guarding or rebound.     Comments: Mild to moderate epigastric tenderness  Musculoskeletal:     Cervical back: Normal range of motion and neck supple.     Right lower leg: No edema.     Left lower leg: No edema.  Skin:    General: Skin is warm and dry.     Findings: No rash.  Neurological:     General: No focal deficit present.     Mental Status: She is alert. Mental status is at baseline.     Motor: No weakness.  Psychiatric:        Mood and Affect: Mood normal.     ED Results / Procedures / Treatments   Labs (all labs ordered are listed, but only abnormal results are displayed) Labs Reviewed - No data to display  EKG None  Radiology No results found.  Procedures Procedures  {Document cardiac monitor, telemetry assessment procedure when appropriate:1}  Medications Ordered in ED Medications - No data to display  ED Course/ Medical Decision Making/ A&P Clinical Course as of 01/07/23 1603  Fri Jan 07, 2023  1525 I evaluated the patient after signout, patient vomited up her medicine and p.o. challenge.  Abdominal exam she has some mild suprapubic and left lower quadrant abdominal pain, given persistent pain and intractable nausea and vomiting we will proceed with CT abdomen to evaluate for obstructive process or diverticulitis versus alternative etiology. [HS]    Clinical Course User Index [HS] Sherrill Raring, PA-C    Patient seen and examined. History obtained directly from patient.   Labs/EKG: Ordered CBC, CMP, lipase, UA.  Imaging: None ordered  Medications/Fluids: Ordered: Medication for pain, nausea, IV fluids.   Most recent vital signs reviewed and are as follows: BP (!) 169/118   Pulse 70   Temp 97.7 F (36.5 C)    Resp 18   Ht '5\' 3"'$  (1.6 m)   Wt 62.6 kg   SpO2 100%   BMI 24.45 kg/m   Initial impression: N/V/D, epigastric pain, recent alcohol consumption  12:28 PM Notified by RN that initial CBC was invalid. Awaiting new results.   1:44 PM Additional nausea medication ordered. Awaiting completion of labs.   2:34 PM Reassessment performed. Patient appears stable.  Sitting and nausea improved after most recent dose of Reglan.  She does not have any epigastric pain, currently complaining of heartburn.  Labs personally reviewed and interpreted including: CBC with white blood cell count 19.8, normal hemoglobin; CMP creatinine minimally elevated at 1.02, BUN of 40 being treated with IV fluids, normal electrolytes; lipase normal; UA no compelling signs of infection  Reviewed pertinent lab work and imaging with patient at bedside. Questions answered.   Most current vital signs reviewed and are as follows: BP (!) 147/108   Pulse 98   Temp 97.8 F (36.6 C) (Oral)   Resp (!) 30   Ht '5\' 3"'$  (1.6 m)   Wt 62.6 kg   SpO2 97%   BMI 24.45 kg/m   Plan: Maalox, p.o. challenge if she does okay with that.  At this point, do not feel that she requires imaging, but will need continued symptom control until tolerating p.o.'s.  3:16 PM Signout to [facility] at shift change. Plan: if feeling better and tolerating fluids, can be d/c with pepcid and reglan. If recurrent vomiting, consider CT.    {   Click here for ABCD2, HEART and other calculatorsREFRESH Note before signing :1}                          Medical Decision Making Amount and/or Complexity of Data Reviewed Labs: ordered.  Risk Prescription drug management.   For this patient's complaint of abdominal pain, the following conditions were considered on the differential diagnosis: gastritis/PUD, enteritis/duodenitis, appendicitis, cholelithiasis/cholecystitis, cholangitis, pancreatitis, ruptured viscus, colitis, diverticulitis, small/large bowel  obstruction, proctitis, cystitis, pyelonephritis, ureteral colic, aortic dissection, aortic aneurysm. In women, ectopic pregnancy, pelvic inflammatory disease, ovarian cysts, and tubo-ovarian abscess were also considered. Atypical chest etiologies were also considered including ACS,  PE, and pneumonia.   {Document critical care time when appropriate:1} {Document review of labs and clinical decision tools ie heart score, Chads2Vasc2 etc:1}  {Document your independent review of radiology images, and any outside records:1} {Document your discussion with family members, caretakers, and with consultants:1} {Document social determinants of health affecting pt's care:1} {Document your decision making why or why not admission, treatments were needed:1} Final Clinical Impression(s) / ED Diagnoses Final diagnoses:  Acute alcoholic gastritis without hemorrhage  Nausea vomiting and diarrhea    Rx / DC Orders ED Discharge Orders          Ordered    metoCLOPramide (REGLAN) 10 MG tablet  Every 6 hours        01/07/23 1500    famotidine (PEPCID) 20 MG tablet  2 times daily        01/07/23 1500

## 2023-01-07 NOTE — ED Provider Notes (Signed)
Patient care assumed from signout, see previous providers note.  Patient presented due to abdominal pain, nausea and vomiting after alcohol consumption yesterday.  Disposition pending p.o. challenge.  Physical Exam  BP (!) 119/59   Pulse 86   Temp 98.7 F (37.1 C) (Oral)   Resp 15   Ht '5\' 3"'$  (1.6 m)   Wt 62.6 kg   SpO2 100%   BMI 24.45 kg/m   Physical Exam Vitals and nursing note reviewed. Exam conducted with a chaperone present.  Constitutional:      Appearance: Normal appearance.  HENT:     Head: Normocephalic and atraumatic.  Eyes:     General: No scleral icterus.       Right eye: No discharge.        Left eye: No discharge.     Extraocular Movements: Extraocular movements intact.     Pupils: Pupils are equal, round, and reactive to light.  Cardiovascular:     Rate and Rhythm: Normal rate and regular rhythm.     Pulses: Normal pulses.     Heart sounds: Normal heart sounds. No murmur heard.    No friction rub. No gallop.  Pulmonary:     Effort: Pulmonary effort is normal. No respiratory distress.     Breath sounds: Normal breath sounds.  Abdominal:     General: Abdomen is flat. Bowel sounds are normal. There is no distension.     Palpations: Abdomen is soft.     Tenderness: There is abdominal tenderness.     Comments: Generalized without rigidity or guarding  Skin:    General: Skin is warm and dry.     Coloration: Skin is not jaundiced.  Neurological:     Mental Status: She is alert. Mental status is at baseline.     Coordination: Coordination normal.     Procedures  Procedures  ED Course / MDM   Clinical Course as of 01/07/23 2018  Fri Jan 07, 2023  1525 I evaluated the patient after signout, patient vomited up her medicine and p.o. challenge.  Abdominal exam she has some mild suprapubic and left lower quadrant abdominal pain, given persistent pain and intractable nausea and vomiting we will proceed with CT abdomen to evaluate for obstructive process or  diverticulitis versus alternative etiology. [HS]  [id number] I reevaluated the patient, she is tolerating p.o.  Feels significantly improved, repeat abdominal exam is benign.  Given she has improved significantly I will cancel the CT abdomen as I suspect her symptoms are most likely secondary to gastritis. [HS]    Clinical Course User Index [HS] Sherrill Raring, PA-C   Medical Decision Making Amount and/or Complexity of Data Reviewed Labs: ordered.  Risk OTC drugs. Prescription drug management.   I viewed laboratory workup and no gross electrolyte derangement, AKI, transaminitis.  Patient is tolerating p.o. and reports feeling markedly improved.  I do not think any indication for further evaluation made initially to consider CT but given significant improvement, no abdominal pain and tolerating p.o. I do not think indicated anymore.  Will have her follow-up closely with PCP, Pepcid and Reglan for prescribed.  Stable for discharge at this time.       Sherrill Raring, PA-C 01/07/23 2018    Leanord Asal K, DO 01/07/23 2326

## 2023-01-11 DIAGNOSIS — R1110 Vomiting, unspecified: Secondary | ICD-10-CM | POA: Diagnosis not present

## 2023-01-11 DIAGNOSIS — I10 Essential (primary) hypertension: Secondary | ICD-10-CM | POA: Diagnosis not present

## 2023-01-24 DIAGNOSIS — R296 Repeated falls: Secondary | ICD-10-CM | POA: Diagnosis not present

## 2023-01-28 ENCOUNTER — Encounter: Payer: Self-pay | Admitting: Family Medicine

## 2023-02-01 ENCOUNTER — Ambulatory Visit
Admission: RE | Admit: 2023-02-01 | Discharge: 2023-02-01 | Disposition: A | Discharge status: Home / Self Care | Payer: 59 | Source: Ambulatory Visit | Attending: Family Medicine | Admitting: Family Medicine

## 2023-02-01 ENCOUNTER — Other Ambulatory Visit: Payer: Self-pay | Admitting: Family Medicine

## 2023-02-01 DIAGNOSIS — R0781 Pleurodynia: Secondary | ICD-10-CM

## 2023-02-03 DIAGNOSIS — I10 Essential (primary) hypertension: Secondary | ICD-10-CM | POA: Diagnosis not present

## 2023-02-03 DIAGNOSIS — R059 Cough, unspecified: Secondary | ICD-10-CM | POA: Diagnosis not present

## 2023-02-04 DIAGNOSIS — D333 Benign neoplasm of cranial nerves: Secondary | ICD-10-CM | POA: Diagnosis not present

## 2023-03-03 DIAGNOSIS — R296 Repeated falls: Secondary | ICD-10-CM | POA: Diagnosis not present

## 2023-03-03 DIAGNOSIS — E1169 Type 2 diabetes mellitus with other specified complication: Secondary | ICD-10-CM | POA: Diagnosis not present

## 2023-03-03 DIAGNOSIS — J441 Chronic obstructive pulmonary disease with (acute) exacerbation: Secondary | ICD-10-CM | POA: Diagnosis not present

## 2023-03-03 DIAGNOSIS — M130 Polyarthritis, unspecified: Secondary | ICD-10-CM | POA: Diagnosis not present

## 2023-03-16 DIAGNOSIS — I10 Essential (primary) hypertension: Secondary | ICD-10-CM | POA: Diagnosis not present

## 2023-03-16 DIAGNOSIS — M797 Fibromyalgia: Secondary | ICD-10-CM | POA: Diagnosis not present

## 2023-03-16 DIAGNOSIS — E119 Type 2 diabetes mellitus without complications: Secondary | ICD-10-CM | POA: Diagnosis not present

## 2023-03-16 DIAGNOSIS — I5022 Chronic systolic (congestive) heart failure: Secondary | ICD-10-CM | POA: Diagnosis not present

## 2023-03-31 DIAGNOSIS — Z86010 Personal history of colonic polyps: Secondary | ICD-10-CM | POA: Diagnosis not present

## 2023-03-31 DIAGNOSIS — K219 Gastro-esophageal reflux disease without esophagitis: Secondary | ICD-10-CM | POA: Diagnosis not present

## 2023-03-31 DIAGNOSIS — K5900 Constipation, unspecified: Secondary | ICD-10-CM | POA: Diagnosis not present

## 2023-03-31 DIAGNOSIS — K600 Acute anal fissure: Secondary | ICD-10-CM | POA: Diagnosis not present

## 2023-04-04 DIAGNOSIS — Z6824 Body mass index (BMI) 24.0-24.9, adult: Secondary | ICD-10-CM | POA: Diagnosis not present

## 2023-04-04 DIAGNOSIS — F519 Sleep disorder not due to a substance or known physiological condition, unspecified: Secondary | ICD-10-CM | POA: Diagnosis not present

## 2023-04-04 DIAGNOSIS — R21 Rash and other nonspecific skin eruption: Secondary | ICD-10-CM | POA: Diagnosis not present

## 2023-04-04 DIAGNOSIS — M130 Polyarthritis, unspecified: Secondary | ICD-10-CM | POA: Diagnosis not present

## 2023-04-04 DIAGNOSIS — R296 Repeated falls: Secondary | ICD-10-CM | POA: Diagnosis not present

## 2023-04-04 DIAGNOSIS — E781 Pure hyperglyceridemia: Secondary | ICD-10-CM | POA: Diagnosis not present

## 2023-04-04 DIAGNOSIS — E1169 Type 2 diabetes mellitus with other specified complication: Secondary | ICD-10-CM | POA: Diagnosis not present

## 2023-04-06 NOTE — Therapy (Unsigned)
OUTPATIENT PHYSICAL THERAPY LOWER EXTREMITY EVALUATION   Patient Name: [name] MRN: [id number] DOB:11/18/1960, 62 y.o., female Today's Date: 04/07/2023  END OF SESSION:  PT End of Session - 04/07/23 1310     Visit Number 1    Number of Visits 5    Date for PT Re-Evaluation 06/02/23    Authorization Type [employer] MCR    PT Start Time 1215    PT Stop Time 1300    PT Time Calculation (min) 45 min    Activity Tolerance Patient tolerated treatment well    Behavior During Therapy Impulsive;WFL for tasks assessed/performed             Past Medical History:  Diagnosis Date   Arthritis    Bowel obstruction (HCC)    Bronchitis    CHF (congestive heart failure) (HCC)    Chronic systolic dysfunction of left ventricle    Diabetes mellitus without complication (HCC)    on oral meds   GERD (gastroesophageal reflux disease)    Headache(784.0)    migraines, hasn't had one for over a year   History of noncompliance with medical treatment    Hypercholesteremia    Hypertension    LVH (left ventricular hypertrophy)    Nonischemic cardiomyopathy (HCC)    Polysubstance abuse (HCC)    Tobacco abuse disorder    Past Surgical History:  Procedure Laterality Date   ABDOMINAL HYSTERECTOMY     ABDOMINAL SURGERY     left foot surgery     STRABISMUS SURGERY Left 07/31/2013   Procedure: REPAIR STRABISMUS;  Surgeon: Shara Blazing, MD;  Location: [facility] OR;  Service: Ophthalmology;  Laterality: Left;  EYE MUSCLE SURGERY LEFT EYE   UTERINE FIBROID SURGERY     Patient Active Problem List   Diagnosis Date Noted   CKD (chronic kidney disease) 06/28/2018   Chronic systolic CHF (congestive heart failure) (HCC) 06/28/2018   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 06/28/2018   Nephropathy associated with another disease 06/28/2018   Lumbago 06/28/2018   Vitamin D deficiency, unspecified 06/28/2018   Routine general medical examination at a health care facility 06/28/2018    Dry eye syndrome 06/28/2018   Imbalance 10/04/2017   Diabetes (HCC) 08/09/2016   Left acoustic neuroma (HCC) 10/03/2015   Essential hypertension 03/29/2012   Chronic systolic dysfunction of left ventricle 11/24/2011   ACS (acute coronary syndrome) (HCC) 11/23/2011   CHF, acute (HCC) 11/23/2011   Cocaine abuse (HCC) 11/23/2011   Tobacco abuse 11/23/2011   Pneumonitis, aspiration (HCC) 11/23/2011   Cardiomyopathy (HCC) 11/23/2011   HTN (hypertension), malignant 11/23/2011    PCP: Renaye Rakers, MD  REFERRING PROVIDER: Renaye Rakers, MD  REFERRING DIAG: R29.6 (ICD-10-CM) - Repeated falls  THERAPY DIAG:  Other abnormalities of gait and mobility  Muscle weakness (generalized)  Rationale for Evaluation and Treatment: Rehabilitation  ONSET DATE: chronic  SUBJECTIVE:   SUBJECTIVE STATEMENT: Describes a history of several falls over a period of time   PERTINENT HISTORY:  PAIN:  Are you having pain? No  PRECAUTIONS: Fall  WEIGHT BEARING RESTRICTIONS: No  FALLS:  Has patient fallen in last 6 months? Yes. Number of falls +25  LIVING ENVIRONMENT: Lives with: lives with their family Lives in: House/apartment Stairs:  yes Has following equipment at home: Dan Humphreys - 2 wheeled and Crutches  OCCUPATION: not working  PLOF: Independent  PATIENT GOALS: To find out why I keep falling  NEXT MD VISIT: as needed  OBJECTIVE:   DIAGNOSTIC  FINDINGS: none  PATIENT SURVEYS:  FOTO 80(78 predicted)  COGNITION: Overall cognitive status: Within functional limits for tasks assessed     MUSCLE LENGTH: deferred  POSTURE: No Significant postural limitations   LOWER EXTREMITY ROM: WFL for gait and transfers  Active ROM Right eval Left eval  Hip flexion    Hip extension    Hip abduction    Hip adduction    Hip internal rotation    Hip external rotation    Knee flexion    Knee extension    Ankle dorsiflexion    Ankle plantarflexion    Ankle inversion    Ankle  eversion     (Blank rows = not tested)  LOWER EXTREMITY MMT:  MMT Right eval Left eval  Hip flexion 3+ 3+  Hip extension 3+ 3+  Hip abduction 3+ 3+  Hip adduction    Hip internal rotation    Hip external rotation    Knee flexion 3+ 3+  Knee extension 3+ 3+  Ankle dorsiflexion    Ankle plantarflexion 3+ 3+  Ankle inversion    Ankle eversion     (Blank rows = not tested)  LOWER EXTREMITY SPECIAL TESTS:  N/A  FUNCTIONAL TESTS:  5 times sit to stand: 38s arms crossed  mCTSIB: Able to hold all 4 positions for 3s  SLS <10s B  GAIT: Distance walked: 64ftx2 Assistive device utilized: None Level of assistance: Complete Independence Comments: increased knee varus and decreased trunk rotation   04/07/23 0001  Berg Balance Test  Sit to Stand 3  Standing Unsupported 4  Sitting with Back Unsupported but Feet Supported on Floor or Stool 4  Stand to Sit 4  Transfers 3  Standing Unsupported with Eyes Closed 4  Standing Unsupported with Feet Together 4  From Standing, Reach Forward with Outstretched Arm 4  From Standing Position, Pick up Object from Floor 4  From Standing Position, Turn to Look Behind Over each Shoulder 4  Turn 360 Degrees 1  Standing Unsupported, Alternately Place Feet on Step/Stool 4  Standing Unsupported, One Foot in Front 3  Standing on One Leg 2  Total Score 48    TODAY'S TREATMENT:                                                                                                                              DATE: 04/07/23 Eval and HEP   PATIENT EDUCATION:  Education details: Discussed eval findings, rehab rationale and POC and patient is in agreement  Person educated: Patient Education method: Explanation Education comprehension: verbalized understanding and needs further education  HOME EXERCISE PROGRAM: Access Code: [email or web address] URL: [email or web address] Date: 04/07/2023 Prepared by: Gustavus Bryant  Exercises - Sit to Stand  Without Arm Support  - 2 x daily - 5 x weekly - 1 sets - 5 reps - Standing Heel Raise with Support  - 2 x daily - 5 x weekly - 1 sets - 15 reps  ASSESSMENT:  CLINICAL IMPRESSION: Patient is a 62 y.o. female who was seen today for physical therapy evaluation and treatment for history of falling with unknown etiology. Patient presents with passing scores on BERG and mCTSIB but increased time needed to complete 5x STS.  BLE strength deficits and history of low back pain and stiffness potentially contributing to frequent falling.  Recommend OPPT to improve LE strength and identify additional factors that may be contributing to falls.  OBJECTIVE IMPAIRMENTS: Abnormal gait, decreased activity tolerance, decreased balance, decreased knowledge of condition, decreased knowledge of use of DME, difficulty walking, impaired perceived functional ability, and pain.   ACTIVITY LIMITATIONS: carrying, standing, stairs, and locomotion level  PERSONAL FACTORS: Age, Fitness, Time since onset of injury/illness/exacerbation, and 1 comorbidity: DM  are also affecting patient's functional outcome.   REHAB POTENTIAL: Fair based on   CLINICAL DECISION MAKING: Evolving/moderate complexity  EVALUATION COMPLEXITY: Low   GOALS: Goals reviewed with patient? No  SHORT TERM GOALS: Target date: 06/02/2023   Patient to demonstrate independence in HEP  Baseline: [id number] Goal status: INITIAL  2.  Able to SLS B for 10s ea. Baseline: <10s hold B Goal status: INITIAL  3.  Decrease 5s STS time to 20s arms crossed Baseline: 38s arms crossed Goal status: INITIAL  4.  Increase BLE strength to 4-/5 overall Baseline:  MMT Right eval Left eval  Hip flexion 3+ 3+  Hip extension 3+ 3+  Hip abduction 3+ 3+  Hip adduction    Hip internal rotation    Hip external rotation    Knee flexion 3+ 3+  Knee extension 3+ 3+  Ankle dorsiflexion    Ankle plantarflexion 3+ 3+   Goal status: INITIAL     PLAN:  PT  FREQUENCY: 1-2x/week  PT DURATION: 4 weeks  PLANNED INTERVENTIONS: Therapeutic exercises, Therapeutic activity, Neuromuscular re-education, Balance training, Gait training, Patient/Family education, Self Care, Joint mobilization, Stair training, DME instructions, and Re-evaluation  PLAN FOR NEXT SESSION: HEP review and update, manual techniques as appropriate, aerobic tasks, ROM and flexibility activities, strengthening and PREs, TPDN, gait and balance training as needed     Hildred Laser, PT 04/07/2023, 1:11 PM

## 2023-04-07 ENCOUNTER — Other Ambulatory Visit: Payer: Self-pay

## 2023-04-07 ENCOUNTER — Ambulatory Visit: Payer: 59 | Attending: Family Medicine

## 2023-04-07 DIAGNOSIS — M6281 Muscle weakness (generalized): Secondary | ICD-10-CM | POA: Insufficient documentation

## 2023-04-07 DIAGNOSIS — R2689 Other abnormalities of gait and mobility: Secondary | ICD-10-CM | POA: Insufficient documentation

## 2023-04-07 DIAGNOSIS — M542 Cervicalgia: Secondary | ICD-10-CM | POA: Diagnosis not present

## 2023-04-12 ENCOUNTER — Ambulatory Visit: Payer: 59

## 2023-04-12 DIAGNOSIS — M6281 Muscle weakness (generalized): Secondary | ICD-10-CM | POA: Diagnosis not present

## 2023-04-12 DIAGNOSIS — R2689 Other abnormalities of gait and mobility: Secondary | ICD-10-CM | POA: Diagnosis not present

## 2023-04-12 DIAGNOSIS — M542 Cervicalgia: Secondary | ICD-10-CM | POA: Diagnosis not present

## 2023-04-12 NOTE — Therapy (Signed)
OUTPATIENT PHYSICAL THERAPY TREATMENT NOTE   Patient Name: [name] MRN: [id number] DOB:1960/11/14, 62 y.o., female Today's Date: 04/12/2023  END OF SESSION:  PT End of Session - 04/12/23 1005     Visit Number 2    Number of Visits 5    Date for PT Re-Evaluation 06/02/23    Authorization Type [employer] MCR    PT Start Time 1005    PT Stop Time 1046    PT Time Calculation (min) 41 min    Activity Tolerance Patient tolerated treatment well    Behavior During Therapy Impulsive;WFL for tasks assessed/performed              Past Medical History:  Diagnosis Date   Arthritis    Bowel obstruction (HCC)    Bronchitis    CHF (congestive heart failure) (HCC)    Chronic systolic dysfunction of left ventricle    Diabetes mellitus without complication (HCC)    on oral meds   GERD (gastroesophageal reflux disease)    Headache(784.0)    migraines, hasn't had one for over a year   History of noncompliance with medical treatment    Hypercholesteremia    Hypertension    LVH (left ventricular hypertrophy)    Nonischemic cardiomyopathy (HCC)    Polysubstance abuse (HCC)    Tobacco abuse disorder    Past Surgical History:  Procedure Laterality Date   ABDOMINAL HYSTERECTOMY     ABDOMINAL SURGERY     left foot surgery     STRABISMUS SURGERY Left 07/31/2013   Procedure: REPAIR STRABISMUS;  Surgeon: Shara Blazing, MD;  Location: [facility] OR;  Service: Ophthalmology;  Laterality: Left;  EYE MUSCLE SURGERY LEFT EYE   UTERINE FIBROID SURGERY     Patient Active Problem List   Diagnosis Date Noted   CKD (chronic kidney disease) 06/28/2018   Chronic systolic CHF (congestive heart failure) (HCC) 06/28/2018   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 06/28/2018   Nephropathy associated with another disease 06/28/2018   Lumbago 06/28/2018   Vitamin D deficiency, unspecified 06/28/2018   Routine general medical examination at a health care facility 06/28/2018   Dry  eye syndrome 06/28/2018   Imbalance 10/04/2017   Diabetes (HCC) 08/09/2016   Left acoustic neuroma (HCC) 10/03/2015   Essential hypertension 03/29/2012   Chronic systolic dysfunction of left ventricle 11/24/2011   ACS (acute coronary syndrome) (HCC) 11/23/2011   CHF, acute (HCC) 11/23/2011   Cocaine abuse (HCC) 11/23/2011   Tobacco abuse 11/23/2011   Pneumonitis, aspiration (HCC) 11/23/2011   Cardiomyopathy (HCC) 11/23/2011   HTN (hypertension), malignant 11/23/2011    PCP: Renaye Rakers, MD  REFERRING PROVIDER: Renaye Rakers, MD  REFERRING DIAG: R29.6 (ICD-10-CM) - Repeated falls  THERAPY DIAG:  Other abnormalities of gait and mobility  Muscle weakness (generalized)  Rationale for Evaluation and Treatment: Rehabilitation  ONSET DATE: chronic  SUBJECTIVE:   SUBJECTIVE STATEMENT: Patient reports one fall since eval, on Saturday at the dog park when she tripped over a root, she reports she caught herself with her hands, did not hit her head and does not report any injuries.   PERTINENT HISTORY:  PAIN:  Are you having pain? No  Chronic low back pain  PRECAUTIONS: Fall  WEIGHT BEARING RESTRICTIONS: No  FALLS:  Has patient fallen in last 6 months? Yes. Number of falls +25  LIVING ENVIRONMENT: Lives with: lives with their family Lives in: House/apartment Stairs:  yes Has following equipment at home: Dan Humphreys - 2  wheeled and Crutches  OCCUPATION: not working  PLOF: Independent  PATIENT GOALS: To find out why I keep falling  NEXT MD VISIT: as needed  OBJECTIVE:   DIAGNOSTIC FINDINGS: none  PATIENT SURVEYS:  FOTO 80(78 predicted)  COGNITION: Overall cognitive status: Within functional limits for tasks assessed     MUSCLE LENGTH: deferred  POSTURE: No Significant postural limitations   LOWER EXTREMITY ROM: WFL for gait and transfers  Active ROM Right eval Left eval  Hip flexion    Hip extension    Hip abduction    Hip adduction    Hip internal  rotation    Hip external rotation    Knee flexion    Knee extension    Ankle dorsiflexion    Ankle plantarflexion    Ankle inversion    Ankle eversion     (Blank rows = not tested)  LOWER EXTREMITY MMT:  MMT Right eval Left eval  Hip flexion 3+ 3+  Hip extension 3+ 3+  Hip abduction 3+ 3+  Hip adduction    Hip internal rotation    Hip external rotation    Knee flexion 3+ 3+  Knee extension 3+ 3+  Ankle dorsiflexion    Ankle plantarflexion 3+ 3+  Ankle inversion    Ankle eversion     (Blank rows = not tested)  LOWER EXTREMITY SPECIAL TESTS:  N/A  FUNCTIONAL TESTS:  5 times sit to stand: 38s arms crossed  mCTSIB: Able to hold all 4 positions for 3s  SLS <10s B  GAIT: Distance walked: 39ftx2 Assistive device utilized: None Level of assistance: Complete Independence Comments: increased knee varus and decreased trunk rotation   04/07/23 0001  Berg Balance Test  Sit to Stand 3  Standing Unsupported 4  Sitting with Back Unsupported but Feet Supported on Floor or Stool 4  Stand to Sit 4  Transfers 3  Standing Unsupported with Eyes Closed 4  Standing Unsupported with Feet Together 4  From Standing, Reach Forward with Outstretched Arm 4  From Standing Position, Pick up Object from Floor 4  From Standing Position, Turn to Look Behind Over each Shoulder 4  Turn 360 Degrees 1  Standing Unsupported, Alternately Place Feet on Step/Stool 4  Standing Unsupported, One Foot in Front 3  Standing on One Leg 2  Total Score 48    TODAY'S TREATMENT:                [facility] Adult PT Treatment:                                                DATE: 04/12/23 Therapeutic Exercise: Nustep level 5 x 5 mins Standing marching with high knees single UE support 2x30" Standing hip abduction/extension 2x10 each BIL Standing heel/toe raises 2x10 LAQ 2.5# 2x10 BIL Seated hamstring curl GTB 2x10 BIL STS with arms crossed x10 STS on airex x10 SLR 2x10 BIL Bridges 2x10 Neuromuscular  re-ed: Tandem stance x30" BIL FT on Airex with head turns  DATE: 04/07/23 Eval and HEP   PATIENT EDUCATION:  Education details: Discussed eval findings, rehab rationale and POC and patient is in agreement  Person educated: Patient Education method: Explanation Education comprehension: verbalized understanding and needs further education  HOME EXERCISE PROGRAM: Access Code: [email or web address] URL: [email or web address] Date: 04/07/2023 Prepared by: Gustavus Bryant  Exercises - Sit to Stand Without Arm Support  - 2 x daily - 5 x weekly - 1 sets - 5 reps - Standing Heel Raise with Support  - 2 x daily - 5 x weekly - 1 sets - 15 reps  ASSESSMENT:  CLINICAL IMPRESSION: Patient presents to first follow up PT session reporting one fall since evaluation, does not report any injuries, she also reports HEP compliance. Session today focused on LE strengthening, improving standing activity tolerance, and balance tasks to decrease fall risk. She had the most difficulty with tandem stance, needing occasional touchdown UE support to maintain balance. Patient was able to tolerate all prescribed exercises with no adverse effects. Patient continues to benefit from skilled PT services and should be progressed as able to improve functional independence.    OBJECTIVE IMPAIRMENTS: Abnormal gait, decreased activity tolerance, decreased balance, decreased knowledge of condition, decreased knowledge of use of DME, difficulty walking, impaired perceived functional ability, and pain.   ACTIVITY LIMITATIONS: carrying, standing, stairs, and locomotion level  PERSONAL FACTORS: Age, Fitness, Time since onset of injury/illness/exacerbation, and 1 comorbidity: DM  are also affecting patient's functional outcome.   REHAB POTENTIAL: Fair based on   CLINICAL DECISION MAKING: Evolving/moderate  complexity  EVALUATION COMPLEXITY: Low   GOALS: Goals reviewed with patient? No  SHORT TERM GOALS: Target date: 06/02/2023   Patient to demonstrate independence in HEP  Baseline: [id number] Goal status: INITIAL  2.  Able to SLS B for 10s ea. Baseline: <10s hold B Goal status: INITIAL  3.  Decrease 5s STS time to 20s arms crossed Baseline: 38s arms crossed Goal status: INITIAL  4.  Increase BLE strength to 4-/5 overall Baseline:  MMT Right eval Left eval  Hip flexion 3+ 3+  Hip extension 3+ 3+  Hip abduction 3+ 3+  Hip adduction    Hip internal rotation    Hip external rotation    Knee flexion 3+ 3+  Knee extension 3+ 3+  Ankle dorsiflexion    Ankle plantarflexion 3+ 3+   Goal status: INITIAL     PLAN:  PT FREQUENCY: 1-2x/week  PT DURATION: 4 weeks  PLANNED INTERVENTIONS: Therapeutic exercises, Therapeutic activity, Neuromuscular re-education, Balance training, Gait training, Patient/Family education, Self Care, Joint mobilization, Stair training, DME instructions, and Re-evaluation  PLAN FOR NEXT SESSION: HEP review and update, manual techniques as appropriate, aerobic tasks, ROM and flexibility activities, strengthening and PREs, TPDN, gait and balance training as needed     Berta Minor, PTA 04/12/2023, 10:53 AM

## 2023-04-25 NOTE — Therapy (Unsigned)
OUTPATIENT PHYSICAL THERAPY TREATMENT NOTE   Patient Name: [name] MRN: [id number] DOB:[birth year], 62 y.o., female Today's Date: 04/27/2023  END OF SESSION:  PT End of Session - 04/27/23 1311     Visit Number 3    Number of Visits 5    Date for PT Re-Evaluation 06/02/23    Authorization Type [employer] MCR    PT Start Time 1315    PT Stop Time 1355    PT Time Calculation (min) 40 min    Activity Tolerance Patient tolerated treatment well    Behavior During Therapy Impulsive;WFL for tasks assessed/performed              Past Medical History:  Diagnosis Date   Arthritis    Bowel obstruction (HCC)    Bronchitis    CHF (congestive heart failure) (HCC)    Chronic systolic dysfunction of left ventricle    Diabetes mellitus without complication (HCC)    on oral meds   GERD (gastroesophageal reflux disease)    Headache(784.0)    migraines, hasn't had one for over a year   History of noncompliance with medical treatment    Hypercholesteremia    Hypertension    LVH (left ventricular hypertrophy)    Nonischemic cardiomyopathy (HCC)    Polysubstance abuse (HCC)    Tobacco abuse disorder    Past Surgical History:  Procedure Laterality Date   ABDOMINAL HYSTERECTOMY     ABDOMINAL SURGERY     left foot surgery     STRABISMUS SURGERY Left 07/31/2013   Procedure: REPAIR STRABISMUS;  Surgeon: Shara Blazing, MD;  Location: [facility] OR;  Service: Ophthalmology;  Laterality: Left;  EYE MUSCLE SURGERY LEFT EYE   UTERINE FIBROID SURGERY     Patient Active Problem List   Diagnosis Date Noted   CKD (chronic kidney disease) 06/28/2018   Chronic systolic CHF (congestive heart failure) (HCC) 06/28/2018   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 06/28/2018   Nephropathy associated with another disease 06/28/2018   Lumbago 06/28/2018   Vitamin D deficiency, unspecified 06/28/2018   Routine general medical examination at a health care facility 06/28/2018   Dry  eye syndrome 06/28/2018   Imbalance 10/04/2017   Diabetes (HCC) 08/09/2016   Left acoustic neuroma (HCC) 10/03/2015   Essential hypertension 03/29/2012   Chronic systolic dysfunction of left ventricle 11/24/2011   ACS (acute coronary syndrome) (HCC) 11/23/2011   CHF, acute (HCC) 11/23/2011   Cocaine abuse (HCC) 11/23/2011   Tobacco abuse 11/23/2011   Pneumonitis, aspiration (HCC) 11/23/2011   Cardiomyopathy (HCC) 11/23/2011   HTN (hypertension), malignant 11/23/2011    PCP: Renaye Rakers, MD  REFERRING PROVIDER: Renaye Rakers, MD  REFERRING DIAG: R29.6 (ICD-10-CM) - Repeated falls  THERAPY DIAG:  Other abnormalities of gait and mobility  Muscle weakness (generalized)  Rationale for Evaluation and Treatment: Rehabilitation  ONSET DATE: chronic  SUBJECTIVE:   SUBJECTIVE STATEMENT: No falls to report.  Continues to report low back pain and will f/u with PCP to address.  PERTINENT HISTORY:  PAIN:  Are you having pain? No  Chronic low back pain  PRECAUTIONS: Fall  WEIGHT BEARING RESTRICTIONS: No  FALLS:  Has patient fallen in last 6 months? Yes. Number of falls +25  LIVING ENVIRONMENT: Lives with: lives with their family Lives in: House/apartment Stairs:  yes Has following equipment at home: Dan Humphreys - 2 wheeled and Crutches  OCCUPATION: not working  PLOF: Independent  PATIENT GOALS: To find out why I keep falling  NEXT MD VISIT: as needed  OBJECTIVE:   DIAGNOSTIC FINDINGS: none  PATIENT SURVEYS:  FOTO 80(78 predicted)  COGNITION: Overall cognitive status: Within functional limits for tasks assessed     MUSCLE LENGTH: deferred  POSTURE: No Significant postural limitations   LOWER EXTREMITY ROM: WFL for gait and transfers  Active ROM Right eval Left eval  Hip flexion    Hip extension    Hip abduction    Hip adduction    Hip internal rotation    Hip external rotation    Knee flexion    Knee extension    Ankle dorsiflexion    Ankle  plantarflexion    Ankle inversion    Ankle eversion     (Blank rows = not tested)  LOWER EXTREMITY MMT:  MMT Right eval Left eval  Hip flexion 3+ 3+  Hip extension 3+ 3+  Hip abduction 3+ 3+  Hip adduction    Hip internal rotation    Hip external rotation    Knee flexion 3+ 3+  Knee extension 3+ 3+  Ankle dorsiflexion    Ankle plantarflexion 3+ 3+  Ankle inversion    Ankle eversion     (Blank rows = not tested)  LOWER EXTREMITY SPECIAL TESTS:  N/A  FUNCTIONAL TESTS:  5 times sit to stand: 38s arms crossed  mCTSIB: Able to hold all 4 positions for 3s  SLS <10s B  GAIT: Distance walked: 42ftx2 Assistive device utilized: None Level of assistance: Complete Independence Comments: increased knee varus and decreased trunk rotation   04/07/23 0001  Berg Balance Test  Sit to Stand 3  Standing Unsupported 4  Sitting with Back Unsupported but Feet Supported on Floor or Stool 4  Stand to Sit 4  Transfers 3  Standing Unsupported with Eyes Closed 4  Standing Unsupported with Feet Together 4  From Standing, Reach Forward with Outstretched Arm 4  From Standing Position, Pick up Object from Floor 4  From Standing Position, Turn to Look Behind Over each Shoulder 4  Turn 360 Degrees 1  Standing Unsupported, Alternately Place Feet on Step/Stool 4  Standing Unsupported, One Foot in Front 3  Standing on One Leg 2  Total Score 48    TODAY'S TREATMENT:             [facility] Adult PT Treatment:                                                DATE: 04/27/23 Therapeutic Exercise: Nustep L4 8 min Supine bridge   Neuromuscular re-ed:    04/27/23 0001  Functional Gait  Assessment  Gait assessed  Yes  Gait Level Surface 3  Change in Gait Speed 3  Gait with Horizontal Head Turns 3  Gait with Vertical Head Turns 3  Gait and Pivot Turn 3  Step Over Obstacle 3  Gait with Narrow Base of Support 3  Gait with Eyes Closed 1  Ambulating Backwards 2  Steps 3  Total Score 27         [facility] Adult PT Treatment:                                                DATE: 04/12/23 Therapeutic Exercise:  Nustep level 5 x 5 mins Standing marching with high knees single UE support 2x30" Standing hip abduction/extension 2x10 each BIL Standing heel/toe raises 2x10 LAQ 2.5# 2x10 BIL Seated hamstring curl GTB 2x10 BIL STS with arms crossed x10 STS on airex x10 SLR 2x10 BIL Bridges 2x10 Neuromuscular re-ed: Tandem stance x30" BIL FT on Airex with head turns                                                                                                                 DATE: 04/07/23 Eval and HEP   PATIENT EDUCATION:  Education details: Discussed eval findings, rehab rationale and POC and patient is in agreement  Person educated: Patient Education method: Explanation Education comprehension: verbalized understanding and needs further education  HOME EXERCISE PROGRAM: Access Code: [email or web address] URL: [email or web address] Date: 04/27/2023 Prepared by: Gustavus Bryant  Exercises - Sit to Stand Without Arm Support  - 2 x daily - 5 x weekly - 1 sets - 5 reps - Standing Heel Raise with Support  - 2 x daily - 5 x weekly - 1 sets - 15 reps - Supine Bridge with Resistance Band  - 2 x daily - 5 x weekly - 1 sets - 15 reps - Hooklying Single Leg Bent Knee Fallouts with Resistance  - 2 x daily - 5 x weekly - 1 sets - 15 reps - Hooklying Clamshell with Resistance  - 2 x daily - 5 x weekly - 1 sets - 15 reps - Supine Quadratus Lumborum Stretch  - 2 x daily - 5 x weekly - 1 sets - 2 reps - 30s hold  ASSESSMENT:  CLINICAL IMPRESSION: Focus of today was hip strengthening and assessment of dynamic balance.  FGA score was 27/30 indicating functional dynamic balance.  Observation of gait shows a decrease in trunk rotation resulting in a "waddling" gait pattern.  Incorporated trunk stretching into session.   OBJECTIVE IMPAIRMENTS: Abnormal gait, decreased activity tolerance, decreased  balance, decreased knowledge of condition, decreased knowledge of use of DME, difficulty walking, impaired perceived functional ability, and pain.   ACTIVITY LIMITATIONS: carrying, standing, stairs, and locomotion level  PERSONAL FACTORS: Age, Fitness, Time since onset of injury/illness/exacerbation, and 1 comorbidity: DM  are also affecting patient's functional outcome.   REHAB POTENTIAL: Fair based on   CLINICAL DECISION MAKING: Evolving/moderate complexity  EVALUATION COMPLEXITY: Low   GOALS: Goals reviewed with patient? No  SHORT TERM GOALS=LONG TERM GOALS: Target date: 06/02/2023   Patient to demonstrate independence in HEP  Baseline: [id number] Goal status: INITIAL  2.  Able to SLS B for 10s ea. Baseline: <10s hold B Goal status: INITIAL  3.  Decrease 5s STS time to 20s arms crossed Baseline: 38s arms crossed Goal status: INITIAL  4.  Increase BLE strength to 4-/5 overall Baseline:  MMT Right eval Left eval  Hip flexion 3+ 3+  Hip extension 3+ 3+  Hip abduction 3+ 3+  Hip adduction    Hip internal rotation    Hip external rotation  Knee flexion 3+ 3+  Knee extension 3+ 3+  Ankle dorsiflexion    Ankle plantarflexion 3+ 3+   Goal status: INITIAL     PLAN:  PT FREQUENCY: 1-2x/week  PT DURATION: 4 weeks  PLANNED INTERVENTIONS: Therapeutic exercises, Therapeutic activity, Neuromuscular re-education, Balance training, Gait training, Patient/Family education, Self Care, Joint mobilization, Stair training, DME instructions, and Re-evaluation  PLAN FOR NEXT SESSION: HEP review and update, manual techniques as appropriate, aerobic tasks, ROM and flexibility activities, strengthening and PREs, TPDN, gait and balance training as needed     Hildred Laser, PT 04/27/2023, 1:59 PM

## 2023-04-27 ENCOUNTER — Ambulatory Visit: Payer: 59

## 2023-04-27 DIAGNOSIS — M542 Cervicalgia: Secondary | ICD-10-CM | POA: Diagnosis not present

## 2023-04-27 DIAGNOSIS — R2689 Other abnormalities of gait and mobility: Secondary | ICD-10-CM

## 2023-04-27 DIAGNOSIS — M6281 Muscle weakness (generalized): Secondary | ICD-10-CM | POA: Diagnosis not present

## 2023-04-28 NOTE — Therapy (Signed)
OUTPATIENT PHYSICAL THERAPY TREATMENT NOTE   Patient Name: [name] MRN: [id number] DOB:08-04-1961, 62 y.o., female Today's Date: 05/02/2023  END OF SESSION:  PT End of Session - 05/02/23 1400     Visit Number 4    Number of Visits 5    Date for PT Re-Evaluation 06/02/23    Authorization Type [employer] MCR    PT Start Time 1400    PT Stop Time 1440    PT Time Calculation (min) 40 min    Activity Tolerance Patient tolerated treatment well    Behavior During Therapy Impulsive;WFL for tasks assessed/performed              Past Medical History:  Diagnosis Date   Arthritis    Bowel obstruction (HCC)    Bronchitis    CHF (congestive heart failure) (HCC)    Chronic systolic dysfunction of left ventricle    Diabetes mellitus without complication (HCC)    on oral meds   GERD (gastroesophageal reflux disease)    Headache(784.0)    migraines, hasn't had one for over a year   History of noncompliance with medical treatment    Hypercholesteremia    Hypertension    LVH (left ventricular hypertrophy)    Nonischemic cardiomyopathy (HCC)    Polysubstance abuse (HCC)    Tobacco abuse disorder    Past Surgical History:  Procedure Laterality Date   ABDOMINAL HYSTERECTOMY     ABDOMINAL SURGERY     left foot surgery     STRABISMUS SURGERY Left 07/31/2013   Procedure: REPAIR STRABISMUS;  Surgeon: Shara Blazing, MD;  Location: [facility] OR;  Service: Ophthalmology;  Laterality: Left;  EYE MUSCLE SURGERY LEFT EYE   UTERINE FIBROID SURGERY     Patient Active Problem List   Diagnosis Date Noted   CKD (chronic kidney disease) 06/28/2018   Chronic systolic CHF (congestive heart failure) (HCC) 06/28/2018   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 06/28/2018   Nephropathy associated with another disease 06/28/2018   Lumbago 06/28/2018   Vitamin D deficiency, unspecified 06/28/2018   Routine general medical examination at a health care facility 06/28/2018   Dry  eye syndrome 06/28/2018   Imbalance 10/04/2017   Diabetes (HCC) 08/09/2016   Left acoustic neuroma (HCC) 10/03/2015   Essential hypertension 03/29/2012   Chronic systolic dysfunction of left ventricle 11/24/2011   ACS (acute coronary syndrome) (HCC) 11/23/2011   CHF, acute (HCC) 11/23/2011   Cocaine abuse (HCC) 11/23/2011   Tobacco abuse 11/23/2011   Pneumonitis, aspiration (HCC) 11/23/2011   Cardiomyopathy (HCC) 11/23/2011   HTN (hypertension), malignant 11/23/2011    PCP: Renaye Rakers, MD  REFERRING PROVIDER: Renaye Rakers, MD  REFERRING DIAG: R29.6 (ICD-10-CM) - Repeated falls  THERAPY DIAG:  Other abnormalities of gait and mobility  Muscle weakness (generalized)  Rationale for Evaluation and Treatment: Rehabilitation  ONSET DATE: chronic  SUBJECTIVE:   SUBJECTIVE STATEMENT: Doing well, no falls or LOB to note since last session.  PERTINENT HISTORY:  PAIN:  Are you having pain? No  Chronic low back pain  PRECAUTIONS: Fall  WEIGHT BEARING RESTRICTIONS: No  FALLS:  Has patient fallen in last 6 months? Yes. Number of falls +25  LIVING ENVIRONMENT: Lives with: lives with their family Lives in: House/apartment Stairs:  yes Has following equipment at home: Dan Humphreys - 2 wheeled and Crutches  OCCUPATION: not working  PLOF: Independent  PATIENT GOALS: To find out why I keep falling  NEXT MD VISIT: as needed  OBJECTIVE:   DIAGNOSTIC FINDINGS: none  PATIENT SURVEYS:  FOTO 80(78 predicted)  COGNITION: Overall cognitive status: Within functional limits for tasks assessed     MUSCLE LENGTH: deferred  POSTURE: No Significant postural limitations   LOWER EXTREMITY ROM: WFL for gait and transfers  Active ROM Right eval Left eval  Hip flexion    Hip extension    Hip abduction    Hip adduction    Hip internal rotation    Hip external rotation    Knee flexion    Knee extension    Ankle dorsiflexion    Ankle plantarflexion    Ankle inversion     Ankle eversion     (Blank rows = not tested)  LOWER EXTREMITY MMT:  MMT Right eval Left eval  Hip flexion 3+ 3+  Hip extension 3+ 3+  Hip abduction 3+ 3+  Hip adduction    Hip internal rotation    Hip external rotation    Knee flexion 3+ 3+  Knee extension 3+ 3+  Ankle dorsiflexion    Ankle plantarflexion 3+ 3+  Ankle inversion    Ankle eversion     (Blank rows = not tested)  LOWER EXTREMITY SPECIAL TESTS:  N/A  FUNCTIONAL TESTS:  5 times sit to stand: 38s arms crossed ; 05/02/23 9s arms crossed  mCTSIB: Able to hold all 4 positions for 3s  SLS <10s B; ~10s B as patient easily distracted  GAIT: Distance walked: 68ftx2 Assistive device utilized: None Level of assistance: Complete Independence Comments: increased knee varus and decreased trunk rotation   04/07/23 0001  Berg Balance Test  Sit to Stand 3  Standing Unsupported 4  Sitting with Back Unsupported but Feet Supported on Floor or Stool 4  Stand to Sit 4  Transfers 3  Standing Unsupported with Eyes Closed 4  Standing Unsupported with Feet Together 4  From Standing, Reach Forward with Outstretched Arm 4  From Standing Position, Pick up Object from Floor 4  From Standing Position, Turn to Look Behind Over each Shoulder 4  Turn 360 Degrees 1  Standing Unsupported, Alternately Place Feet on Step/Stool 4  Standing Unsupported, One Foot in Front 3  Standing on One Leg 2  Total Score 48    TODAY'S TREATMENT:       [facility] Adult PT Treatment:                                                DATE: 05/02/23 Therapeutic Exercise: Nustep L5 8 min Seated hamstring stretch 30s ea.(No tightness) Bridge with ball 15x Supine hip fallouts 15x B, 15/15 unilaterally BluTB QL stretch 30s x2 B Supine hip fallouts 15x B, 15/15 unilaterally BluTB Re-assessment of goal progress ie. 5x STS, SLS     [facility] Adult PT Treatment:                                                DATE: 04/27/23 Therapeutic Exercise: Nustep L4 8  min Supine bridge 15x Supine hip fallouts 15x B, 15/15 unilaterally BluTB  Neuromuscular re-ed:    04/27/23 0001  Functional Gait  Assessment  Gait assessed  Yes  Gait Level Surface 3  Change in Gait Speed 3  Gait with  Horizontal Head Turns 3  Gait with Vertical Head Turns 3  Gait and Pivot Turn 3  Step Over Obstacle 3  Gait with Narrow Base of Support 3  Gait with Eyes Closed 1  Ambulating Backwards 2  Steps 3  Total Score 27        [facility] Adult PT Treatment:                                                DATE: 04/12/23 Therapeutic Exercise: Nustep level 5 x 5 mins Standing marching with high knees single UE support 2x30" Standing hip abduction/extension 2x10 each BIL Standing heel/toe raises 2x10 LAQ 2.5# 2x10 BIL Seated hamstring curl GTB 2x10 BIL STS with arms crossed x10 STS on airex x10 SLR 2x10 BIL Bridges 2x10 Neuromuscular re-ed: Tandem stance x30" BIL FT on Airex with head turns                                                                                                                 DATE: 04/07/23 Eval and HEP   PATIENT EDUCATION:  Education details: Discussed eval findings, rehab rationale and POC and patient is in agreement  Person educated: Patient Education method: Explanation Education comprehension: verbalized understanding and needs further education  HOME EXERCISE PROGRAM: Access Code: [email or web address] URL: [email or web address] Date: 04/27/2023 Prepared by: Gustavus Bryant  Exercises - Sit to Stand Without Arm Support  - 2 x daily - 5 x weekly - 1 sets - 5 reps - Standing Heel Raise with Support  - 2 x daily - 5 x weekly - 1 sets - 15 reps - Supine Bridge with Resistance Band  - 2 x daily - 5 x weekly - 1 sets - 15 reps - Hooklying Single Leg Bent Knee Fallouts with Resistance  - 2 x daily - 5 x weekly - 1 sets - 15 reps - Hooklying Clamshell with Resistance  - 2 x daily - 5 x weekly - 1 sets - 15 reps - Supine Quadratus Lumborum  Stretch  - 2 x daily - 5 x weekly - 1 sets - 2 reps - 30s hold  ASSESSMENT:  CLINICAL IMPRESSION: Focus of today was hip strengthening and assessment of progress towards goals.  Still shows difficulty maintaining SLS.  Advanced to BlaTB for resisted tasks.   OBJECTIVE IMPAIRMENTS: Abnormal gait, decreased activity tolerance, decreased balance, decreased knowledge of condition, decreased knowledge of use of DME, difficulty walking, impaired perceived functional ability, and pain.   ACTIVITY LIMITATIONS: carrying, standing, stairs, and locomotion level  PERSONAL FACTORS: Age, Fitness, Time since onset of injury/illness/exacerbation, and 1 comorbidity: DM  are also affecting patient's functional outcome.   REHAB POTENTIAL: Fair based on   CLINICAL DECISION MAKING: Evolving/moderate complexity  EVALUATION COMPLEXITY: Low   GOALS: Goals reviewed with patient? No  SHORT TERM GOALS=LONG TERM GOALS: Target date: 06/02/2023   Patient to demonstrate  independence in HEP  Baseline: [id number] Goal status: Met  2.  Able to SLS B for 10s ea. Baseline: <10s hold B; 05/02/23 ~10s B (as she is easily distracted) Goal status: Met  3.  Decrease 5s STS time to 20s arms crossed Baseline: 38s arms crossed; 05/02/23 9s arms Goal status: Met  4.  Increase BLE strength to 4-/5 overall Baseline:  MMT Right eval Left eval  Hip flexion 3+ 3+  Hip extension 3+ 3+  Hip abduction 3+ 3+  Hip adduction    Hip internal rotation    Hip external rotation    Knee flexion 3+ 3+  Knee extension 3+ 3+  Ankle dorsiflexion    Ankle plantarflexion 3+ 3+   Goal status: INITIAL   PLAN:  PT FREQUENCY: 1-2x/week  PT DURATION: 4 weeks  PLANNED INTERVENTIONS: Therapeutic exercises, Therapeutic activity, Neuromuscular re-education, Balance training, Gait training, Patient/Family education, Self Care, Joint mobilization, Stair training, DME instructions, and Re-evaluation  PLAN FOR NEXT SESSION: HEP review and  update, manual techniques as appropriate, aerobic tasks, ROM and flexibility activities, strengthening and PREs, TPDN, gait and balance training as needed     Hildred Laser, PT 05/02/2023, 2:43 PM

## 2023-05-02 ENCOUNTER — Ambulatory Visit: Payer: 59 | Attending: Family Medicine

## 2023-05-02 DIAGNOSIS — M6281 Muscle weakness (generalized): Secondary | ICD-10-CM | POA: Diagnosis not present

## 2023-05-02 DIAGNOSIS — R2689 Other abnormalities of gait and mobility: Secondary | ICD-10-CM | POA: Diagnosis not present

## 2023-05-02 DIAGNOSIS — M5459 Other low back pain: Secondary | ICD-10-CM | POA: Insufficient documentation

## 2023-05-04 DIAGNOSIS — I5022 Chronic systolic (congestive) heart failure: Secondary | ICD-10-CM | POA: Diagnosis not present

## 2023-05-04 DIAGNOSIS — I10 Essential (primary) hypertension: Secondary | ICD-10-CM | POA: Diagnosis not present

## 2023-05-04 DIAGNOSIS — R0789 Other chest pain: Secondary | ICD-10-CM | POA: Diagnosis not present

## 2023-05-04 DIAGNOSIS — I428 Other cardiomyopathies: Secondary | ICD-10-CM | POA: Diagnosis not present

## 2023-05-04 DIAGNOSIS — M545 Low back pain, unspecified: Secondary | ICD-10-CM | POA: Diagnosis not present

## 2023-05-04 DIAGNOSIS — M130 Polyarthritis, unspecified: Secondary | ICD-10-CM | POA: Diagnosis not present

## 2023-05-09 DIAGNOSIS — M545 Low back pain, unspecified: Secondary | ICD-10-CM | POA: Diagnosis not present

## 2023-05-10 ENCOUNTER — Other Ambulatory Visit: Payer: Self-pay | Admitting: Physician Assistant

## 2023-05-10 DIAGNOSIS — M48061 Spinal stenosis, lumbar region without neurogenic claudication: Secondary | ICD-10-CM

## 2023-05-10 DIAGNOSIS — M5126 Other intervertebral disc displacement, lumbar region: Secondary | ICD-10-CM

## 2023-05-10 NOTE — Therapy (Unsigned)
OUTPATIENT PHYSICAL THERAPY TREATMENT NOTE   Patient Name: [name] MRN: [id number] DOB:07/08/1961, 62 y.o., female Today's Date: 05/02/2023  END OF SESSION:  PT End of Session - 05/02/23 1400     Visit Number 4    Number of Visits 5    Date for PT Re-Evaluation 06/02/23    Authorization Type [employer] MCR    PT Start Time 1400    PT Stop Time 1440    PT Time Calculation (min) 40 min    Activity Tolerance Patient tolerated treatment well    Behavior During Therapy Impulsive;WFL for tasks assessed/performed              Past Medical History:  Diagnosis Date   Arthritis    Bowel obstruction (HCC)    Bronchitis    CHF (congestive heart failure) (HCC)    Chronic systolic dysfunction of left ventricle    Diabetes mellitus without complication (HCC)    on oral meds   GERD (gastroesophageal reflux disease)    Headache(784.0)    migraines, hasn't had one for over a year   History of noncompliance with medical treatment    Hypercholesteremia    Hypertension    LVH (left ventricular hypertrophy)    Nonischemic cardiomyopathy (HCC)    Polysubstance abuse (HCC)    Tobacco abuse disorder    Past Surgical History:  Procedure Laterality Date   ABDOMINAL HYSTERECTOMY     ABDOMINAL SURGERY     left foot surgery     STRABISMUS SURGERY Left 07/31/2013   Procedure: REPAIR STRABISMUS;  Surgeon: William O Young, MD;  Location: [facility] OR;  Service: Ophthalmology;  Laterality: Left;  EYE MUSCLE SURGERY LEFT EYE   UTERINE FIBROID SURGERY     Patient Active Problem List   Diagnosis Date Noted   CKD (chronic kidney disease) 06/28/2018   Chronic systolic CHF (congestive heart failure) (HCC) 06/28/2018   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 06/28/2018   Nephropathy associated with another disease 06/28/2018   Lumbago 06/28/2018   Vitamin D deficiency, unspecified 06/28/2018   Routine general medical examination at a health care facility 06/28/2018   Dry  eye syndrome 06/28/2018   Imbalance 10/04/2017   Diabetes (HCC) 08/09/2016   Left acoustic neuroma (HCC) 10/03/2015   Essential hypertension 03/29/2012   Chronic systolic dysfunction of left ventricle 11/24/2011   ACS (acute coronary syndrome) (HCC) 11/23/2011   CHF, acute (HCC) 11/23/2011   Cocaine abuse (HCC) 11/23/2011   Tobacco abuse 11/23/2011   Pneumonitis, aspiration (HCC) 11/23/2011   Cardiomyopathy (HCC) 11/23/2011   HTN (hypertension), malignant 11/23/2011    PCP: Bland, Veita, MD  REFERRING PROVIDER: Bland, Veita, MD  REFERRING DIAG: R29.6 (ICD-10-CM) - Repeated falls  THERAPY DIAG:  Other abnormalities of gait and mobility  Muscle weakness (generalized)  Rationale for Evaluation and Treatment: Rehabilitation  ONSET DATE: chronic  SUBJECTIVE:   SUBJECTIVE STATEMENT: Doing well, no falls or LOB to note since last session.  PERTINENT HISTORY:  PAIN:  Are you having pain? No  Chronic low back pain  PRECAUTIONS: Fall  WEIGHT BEARING RESTRICTIONS: No  FALLS:  Has patient fallen in last 6 months? Yes. Number of falls +25  LIVING ENVIRONMENT: Lives with: lives with their family Lives in: House/apartment Stairs:  yes Has following equipment at home: Walker - 2 wheeled and Crutches  OCCUPATION: not working  PLOF: Independent  PATIENT GOALS: To find out why I keep falling  NEXT MD VISIT: as needed    OBJECTIVE:   DIAGNOSTIC FINDINGS: none  PATIENT SURVEYS:  FOTO 80(78 predicted)  COGNITION: Overall cognitive status: Within functional limits for tasks assessed     MUSCLE LENGTH: deferred  POSTURE: No Significant postural limitations   LOWER EXTREMITY ROM: WFL for gait and transfers  Active ROM Right eval Left eval  Hip flexion    Hip extension    Hip abduction    Hip adduction    Hip internal rotation    Hip external rotation    Knee flexion    Knee extension    Ankle dorsiflexion    Ankle plantarflexion    Ankle inversion     Ankle eversion     (Blank rows = not tested)  LOWER EXTREMITY MMT:  MMT Right eval Left eval  Hip flexion 3+ 3+  Hip extension 3+ 3+  Hip abduction 3+ 3+  Hip adduction    Hip internal rotation    Hip external rotation    Knee flexion 3+ 3+  Knee extension 3+ 3+  Ankle dorsiflexion    Ankle plantarflexion 3+ 3+  Ankle inversion    Ankle eversion     (Blank rows = not tested)  LOWER EXTREMITY SPECIAL TESTS:  N/A  FUNCTIONAL TESTS:  5 times sit to stand: 38s arms crossed ; 05/02/23 9s arms crossed  mCTSIB: Able to hold all 4 positions for 3s  SLS <10s B; ~10s B as patient easily distracted  GAIT: Distance walked: 75ftx2 Assistive device utilized: None Level of assistance: Complete Independence Comments: increased knee varus and decreased trunk rotation   04/07/23 0001  Berg Balance Test  Sit to Stand 3  Standing Unsupported 4  Sitting with Back Unsupported but Feet Supported on Floor or Stool 4  Stand to Sit 4  Transfers 3  Standing Unsupported with Eyes Closed 4  Standing Unsupported with Feet Together 4  From Standing, Reach Forward with Outstretched Arm 4  From Standing Position, Pick up Object from Floor 4  From Standing Position, Turn to Look Behind Over each Shoulder 4  Turn 360 Degrees 1  Standing Unsupported, Alternately Place Feet on Step/Stool 4  Standing Unsupported, One Foot in Front 3  Standing on One Leg 2  Total Score 48    TODAY'S TREATMENT:       [facility] Adult PT Treatment:                                                DATE: 05/02/23 Therapeutic Exercise: Nustep L5 8 min Seated hamstring stretch 30s ea.(No tightness) Bridge with ball 15x Supine hip fallouts 15x B, 15/15 unilaterally BluTB QL stretch 30s x2 B Supine hip fallouts 15x B, 15/15 unilaterally BluTB Re-assessment of goal progress ie. 5x STS, SLS     [facility] Adult PT Treatment:                                                DATE: 04/27/23 Therapeutic Exercise: Nustep L4 8  min Supine bridge 15x Supine hip fallouts 15x B, 15/15 unilaterally BluTB  Neuromuscular re-ed:    04/27/23 0001  Functional Gait  Assessment  Gait assessed  Yes  Gait Level Surface 3  Change in Gait Speed 3  Gait with   Horizontal Head Turns 3  Gait with Vertical Head Turns 3  Gait and Pivot Turn 3  Step Over Obstacle 3  Gait with Narrow Base of Support 3  Gait with Eyes Closed 1  Ambulating Backwards 2  Steps 3  Total Score 27        [facility] Adult PT Treatment:                                                DATE: 04/12/23 Therapeutic Exercise: Nustep level 5 x 5 mins Standing marching with high knees single UE support 2x30" Standing hip abduction/extension 2x10 each BIL Standing heel/toe raises 2x10 LAQ 2.5# 2x10 BIL Seated hamstring curl GTB 2x10 BIL STS with arms crossed x10 STS on airex x10 SLR 2x10 BIL Bridges 2x10 Neuromuscular re-ed: Tandem stance x30" BIL FT on Airex with head turns                                                                                                                 DATE: 04/07/23 Eval and HEP   PATIENT EDUCATION:  Education details: Discussed eval findings, rehab rationale and POC and patient is in agreement  Person educated: Patient Education method: Explanation Education comprehension: verbalized understanding and needs further education  HOME EXERCISE PROGRAM: Access Code: [email or web address] URL: [email or web address] Date: 04/27/2023 Prepared by: Jakhiya Brower  Exercises - Sit to Stand Without Arm Support  - 2 x daily - 5 x weekly - 1 sets - 5 reps - Standing Heel Raise with Support  - 2 x daily - 5 x weekly - 1 sets - 15 reps - Supine Bridge with Resistance Band  - 2 x daily - 5 x weekly - 1 sets - 15 reps - Hooklying Single Leg Bent Knee Fallouts with Resistance  - 2 x daily - 5 x weekly - 1 sets - 15 reps - Hooklying Clamshell with Resistance  - 2 x daily - 5 x weekly - 1 sets - 15 reps - Supine Quadratus Lumborum  Stretch  - 2 x daily - 5 x weekly - 1 sets - 2 reps - 30s hold  ASSESSMENT:  CLINICAL IMPRESSION: Focus of today was hip strengthening and assessment of progress towards goals.  Still shows difficulty maintaining SLS.  Advanced to BlaTB for resisted tasks.   OBJECTIVE IMPAIRMENTS: Abnormal gait, decreased activity tolerance, decreased balance, decreased knowledge of condition, decreased knowledge of use of DME, difficulty walking, impaired perceived functional ability, and pain.   ACTIVITY LIMITATIONS: carrying, standing, stairs, and locomotion level  PERSONAL FACTORS: Age, Fitness, Time since onset of injury/illness/exacerbation, and 1 comorbidity: DM  are also affecting patient's functional outcome.   REHAB POTENTIAL: Fair based on   CLINICAL DECISION MAKING: Evolving/moderate complexity  EVALUATION COMPLEXITY: Low   GOALS: Goals reviewed with patient? No  SHORT TERM GOALS=LONG TERM GOALS: Target date: 06/02/2023   Patient to demonstrate   independence in HEP  Baseline: [id number] Goal status: Met  2.  Able to SLS B for 10s ea. Baseline: <10s hold B; 05/02/23 ~10s B (as she is easily distracted) Goal status: Met  3.  Decrease 5s STS time to 20s arms crossed Baseline: 38s arms crossed; 05/02/23 9s arms Goal status: Met  4.  Increase BLE strength to 4-/5 overall Baseline:  MMT Right eval Left eval  Hip flexion 3+ 3+  Hip extension 3+ 3+  Hip abduction 3+ 3+  Hip adduction    Hip internal rotation    Hip external rotation    Knee flexion 3+ 3+  Knee extension 3+ 3+  Ankle dorsiflexion    Ankle plantarflexion 3+ 3+   Goal status: INITIAL   PLAN:  PT FREQUENCY: 1-2x/week  PT DURATION: 4 weeks  PLANNED INTERVENTIONS: Therapeutic exercises, Therapeutic activity, Neuromuscular re-education, Balance training, Gait training, Patient/Family education, Self Care, Joint mobilization, Stair training, DME instructions, and Re-evaluation  PLAN FOR NEXT SESSION: HEP review and  update, manual techniques as appropriate, aerobic tasks, ROM and flexibility activities, strengthening and PREs, TPDN, gait and balance training as needed     Zamyiah Tino M Willean Schurman, PT 05/02/2023, 2:43 PM   

## 2023-05-11 ENCOUNTER — Ambulatory Visit: Payer: 59

## 2023-05-11 ENCOUNTER — Other Ambulatory Visit: Payer: Self-pay | Admitting: Family Medicine

## 2023-05-11 DIAGNOSIS — M5459 Other low back pain: Secondary | ICD-10-CM | POA: Diagnosis not present

## 2023-05-11 DIAGNOSIS — M6281 Muscle weakness (generalized): Secondary | ICD-10-CM | POA: Diagnosis not present

## 2023-05-11 DIAGNOSIS — R2689 Other abnormalities of gait and mobility: Secondary | ICD-10-CM | POA: Diagnosis not present

## 2023-05-11 DIAGNOSIS — Z1231 Encounter for screening mammogram for malignant neoplasm of breast: Secondary | ICD-10-CM

## 2023-05-18 NOTE — Therapy (Unsigned)
OUTPATIENT PHYSICAL THERAPY THORACOLUMBAR EVALUATION   Patient Name: BIANA COUSENS MRN: [id number] DOB:[birth year], 62 y.o., female Today's Date: 05/23/2023  END OF SESSION:   05/20/23 1345  PT Visits / Re-Eval  Visit Number 1  Number of Visits 6  Date for PT Re-Evaluation 07/21/23  Authorization  Authorization Type [employer] MCR  PT Time Calculation  PT Start Time 1340  PT Stop Time 1420  PT Time Calculation (min) 40 min  PT - End of Session  Activity Tolerance Patient tolerated treatment well  Behavior During Therapy Impulsive;WFL for tasks assessed/performed     Past Medical History:  Diagnosis Date   Arthritis    Bowel obstruction (HCC)    Bronchitis    CHF (congestive heart failure) (HCC)    Chronic systolic dysfunction of left ventricle    Diabetes mellitus without complication (HCC)    on oral meds   GERD (gastroesophageal reflux disease)    Headache(784.0)    migraines, hasn't had one for over a year   History of noncompliance with medical treatment    Hypercholesteremia    Hypertension    LVH (left ventricular hypertrophy)    Nonischemic cardiomyopathy (HCC)    Polysubstance abuse (HCC)    Tobacco abuse disorder    Past Surgical History:  Procedure Laterality Date   ABDOMINAL HYSTERECTOMY     ABDOMINAL SURGERY     left foot surgery     STRABISMUS SURGERY Left 07/31/2013   Procedure: REPAIR STRABISMUS;  Surgeon: Shara Blazing, MD;  Location: [facility] OR;  Service: Ophthalmology;  Laterality: Left;  EYE MUSCLE SURGERY LEFT EYE   UTERINE FIBROID SURGERY     Patient Active Problem List   Diagnosis Date Noted   CKD (chronic kidney disease) 06/28/2018   Chronic systolic CHF (congestive heart failure) (HCC) 06/28/2018   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 06/28/2018   Nephropathy associated with another disease 06/28/2018   Lumbago 06/28/2018   Vitamin D deficiency, unspecified 06/28/2018   Routine general medical examination at a  health care facility 06/28/2018   Dry eye syndrome 06/28/2018   Imbalance 10/04/2017   Diabetes (HCC) 08/09/2016   Left acoustic neuroma (HCC) 10/03/2015   Essential hypertension 03/29/2012   Chronic systolic dysfunction of left ventricle 11/24/2011   ACS (acute coronary syndrome) (HCC) 11/23/2011   CHF, acute (HCC) 11/23/2011   Cocaine abuse (HCC) 11/23/2011   Tobacco abuse 11/23/2011   Pneumonitis, aspiration (HCC) 11/23/2011   Cardiomyopathy (HCC) 11/23/2011   HTN (hypertension), malignant 11/23/2011    PCP: Renaye Rakers, MD   REFERRING PROVIDER: Estanislado Spire PA  REFERRING DIAG: L4 radiculopathy  Rationale for Evaluation and Treatment: Rehabilitation  THERAPY DIAG:  Other low back pain  Muscle weakness (generalized)  ONSET DATE: chronic  SUBJECTIVE:  SUBJECTIVE STATEMENT: Describes several year history of diffuse low back pain and soreness.  Denies radiating symptoms  PERTINENT HISTORY:  None available  PAIN:  Are you having pain? Yes: NPRS scale: 10/10 Pain location: low back  Pain description: ache Aggravating factors: twisting, sudden movements Relieving factors: position changes  PRECAUTIONS: None  RED FLAGS: None   WEIGHT BEARING RESTRICTIONS: No  FALLS:  Has patient fallen in last 6 months? Yes. Number of falls 6+  OCCUPATION: part time house keeping  PLOF: Independent  PATIENT GOALS: To reduce and manage my back pain  NEXT MD VISIT: as needed  OBJECTIVE:   DIAGNOSTIC FINDINGS:  None recent  PATIENT SURVEYS:  ODI TBD    MUSCLE LENGTH: Hamstrings: Right 90 deg; Left 90 deg PKB unremarkable B  POSTURE: decreased lumbar lordosis  PALPATION: Point tender over B greater trochanters and B gluteus medius  LUMBAR ROM:   AROM eval  Flexion 75%   Extension 50% P!  Right lateral flexion 50%  Left lateral flexion 50%  Right rotation   Left rotation    (Blank rows = not tested)  LOWER EXTREMITY ROM:   WNL B  Active  Right eval Left eval  Hip flexion    Hip extension    Hip abduction    Hip adduction    Hip internal rotation    Hip external rotation    Knee flexion    Knee extension    Ankle dorsiflexion    Ankle plantarflexion    Ankle inversion    Ankle eversion     (Blank rows = not tested)  LOWER EXTREMITY MMT:    MMT Right eval Left eval  Hip flexion 4 4  Hip extension 4 4  Hip abduction 4 4  Hip adduction    Hip internal rotation    Hip external rotation    Knee flexion 4 4  Knee extension 4 4  Ankle dorsiflexion    Ankle plantarflexion 4 4  Ankle inversion    Ankle eversion     (Blank rows = not tested)  LUMBAR SPECIAL TESTS:  Straight leg raise test: Negative, Slump test: Negative, and Stork standing: Negative  FUNCTIONAL TESTS:  5 times sit to stand: 9s arms crossed  GAIT: Distance walked: 73ftx2 Assistive device utilized: None Level of assistance: Complete Independence Comments: minimal trunk rotation  TODAY'S TREATMENT:                                                                                                                              DATE: 05/20/23 Eval    PATIENT EDUCATION:  Education details: Discussed eval findings, rehab rationale and POC and patient is in agreement  Person educated: Patient Education method: Explanation Education comprehension: verbalized understanding and needs further education  HOME EXERCISE PROGRAM: Access Code: [email or web address] URL: [email or web address] Date: 05/23/2023 Prepared by: Gustavus Bryant  Exercises - Sidelying Open Book Thoracic Lumbar Rotation and Extension  - 2 x daily -  5 x weekly - 1 sets - 10 reps - Supine Figure 4 Piriformis Stretch  - 2 x daily - 5 x weekly - 1 sets - 2 reps - 30s hold - Static Prone on Elbows  - 2 x  daily - 5 x weekly - 1 sets - 1 reps - 2 min hold - Plank on Knees  - 1 x daily - 5 x weekly - 1 sets - 2 reps - 30s hold  ASSESSMENT:  CLINICAL IMPRESSION: Patient is a 62 y.o. female who was seen today for physical therapy evaluation and treatment for chronic low back pain. Finding show full LE mobility, no nerve tension signs and strength deficits in trunk and BLEs.  Lumbar spring testing finds limited intersegmental mobility and subsequent loss of trunk ROM.  Patient would benefit from OPPT to address spinal stiffness and trunk/LE weakness.   OBJECTIVE IMPAIRMENTS: Abnormal gait, decreased activity tolerance, decreased balance, decreased knowledge of condition, decreased mobility, difficulty walking, decreased strength, hypomobility, impaired flexibility, improper body mechanics, and pain.   ACTIVITY LIMITATIONS: carrying, lifting, sitting, standing, squatting, and stairs  PERSONAL FACTORS: Age, Fitness, and Time since onset of injury/illness/exacerbation are also affecting patient's functional outcome.   REHAB POTENTIAL: Good  CLINICAL DECISION MAKING: Stable/uncomplicated  EVALUATION COMPLEXITY: Low   GOALS: Goals reviewed with patient? No  SHORT TERM GOALS: Target date: 06/10/2023  Patient to demonstrate independence in HEP  Baseline: [id number] Goal status: INITIAL   LONG TERM GOALS: Target date: 07/01/2023    Decrease pain to 6/10 Baseline: 10/10 Goal status: INITIAL  2.  Increase trunk ROM to 75% Baseline:  AROM eval  Flexion 75%  Extension 50% P!  Right lateral flexion 50%  Left lateral flexion 50%  Right rotation   Left rotation    Goal status: INITIAL  3.  Increase BLE strength to 4+/5 Baseline:  MMT Right eval Left eval  Hip flexion 4 4  Hip extension 4 4  Hip abduction 4 4  Hip adduction    Hip internal rotation    Hip external rotation    Knee flexion 4 4  Knee extension 4 4  Ankle dorsiflexion    Ankle plantarflexion 4 4   Goal status:  INITIAL  4.  Obtain FOTO/ODI and determine functional level Baseline: TBD Goal status: INITIAL    PLAN:  PT FREQUENCY: 1-2x/week  PT DURATION: 6 weeks  PLANNED INTERVENTIONS: Therapeutic exercises, Therapeutic activity, Neuromuscular re-education, Balance training, Gait training, Patient/Family education, Self Care, Joint mobilization, Stair training, Aquatic Therapy, Dry Needling, Electrical stimulation, Spinal mobilization, Cryotherapy, Moist heat, Manual therapy, and Re-evaluation.  PLAN FOR NEXT SESSION: HEP review and update, manual techniques as appropriate, aerobic tasks, ROM and flexibility activities, strengthening and PREs, TPDN, gait and balance training as needed     Hildred Laser, PT 05/23/2023, 8:54 AM

## 2023-05-19 ENCOUNTER — Ambulatory Visit: Payer: 59

## 2023-05-20 ENCOUNTER — Ambulatory Visit: Payer: 59

## 2023-05-20 DIAGNOSIS — M5459 Other low back pain: Secondary | ICD-10-CM

## 2023-05-20 DIAGNOSIS — M6281 Muscle weakness (generalized): Secondary | ICD-10-CM | POA: Diagnosis not present

## 2023-05-20 DIAGNOSIS — R2689 Other abnormalities of gait and mobility: Secondary | ICD-10-CM | POA: Diagnosis not present

## 2023-05-23 ENCOUNTER — Ambulatory Visit
Admission: RE | Admit: 2023-05-23 | Discharge: 2023-05-23 | Disposition: A | Discharge status: Home / Self Care | Payer: 59 | Source: Ambulatory Visit | Attending: Physician Assistant | Admitting: Physician Assistant

## 2023-05-23 ENCOUNTER — Other Ambulatory Visit: Payer: Self-pay

## 2023-05-23 DIAGNOSIS — M48061 Spinal stenosis, lumbar region without neurogenic claudication: Secondary | ICD-10-CM

## 2023-05-23 DIAGNOSIS — M5126 Other intervertebral disc displacement, lumbar region: Secondary | ICD-10-CM

## 2023-05-23 DIAGNOSIS — M4316 Spondylolisthesis, lumbar region: Secondary | ICD-10-CM | POA: Diagnosis not present

## 2023-05-23 DIAGNOSIS — M5136 Other intervertebral disc degeneration, lumbar region: Secondary | ICD-10-CM | POA: Diagnosis not present

## 2023-05-24 ENCOUNTER — Ambulatory Visit: Payer: 59

## 2023-05-24 DIAGNOSIS — M6281 Muscle weakness (generalized): Secondary | ICD-10-CM

## 2023-05-24 DIAGNOSIS — M5459 Other low back pain: Secondary | ICD-10-CM

## 2023-05-24 DIAGNOSIS — R2689 Other abnormalities of gait and mobility: Secondary | ICD-10-CM | POA: Diagnosis not present

## 2023-05-24 NOTE — Therapy (Signed)
OUTPATIENT PHYSICAL THERAPY THORACOLUMBAR EVALUATION   Patient Name: [name] MRN: [id number] DOB:Dec 26, 1960, 62 y.o., female Today's Date: 05/24/2023  END OF SESSION:  PT End of Session - 05/24/23 1621     Visit Number 2    Number of Visits 6    Date for PT Re-Evaluation 07/21/23    Authorization Type [employer] MCR    PT Start Time 1615    Activity Tolerance Patient tolerated treatment well    Behavior During Therapy Impulsive;WFL for tasks assessed/performed              Past Medical History:  Diagnosis Date   Arthritis    Bowel obstruction (HCC)    Bronchitis    CHF (congestive heart failure) (HCC)    Chronic systolic dysfunction of left ventricle    Diabetes mellitus without complication (HCC)    on oral meds   GERD (gastroesophageal reflux disease)    Headache(784.0)    migraines, hasn't had one for over a year   History of noncompliance with medical treatment    Hypercholesteremia    Hypertension    LVH (left ventricular hypertrophy)    Nonischemic cardiomyopathy (HCC)    Polysubstance abuse (HCC)    Tobacco abuse disorder    Past Surgical History:  Procedure Laterality Date   ABDOMINAL HYSTERECTOMY     ABDOMINAL SURGERY     left foot surgery     STRABISMUS SURGERY Left 07/31/2013   Procedure: REPAIR STRABISMUS;  Surgeon: Shara Blazing, MD;  Location: [facility] OR;  Service: Ophthalmology;  Laterality: Left;  EYE MUSCLE SURGERY LEFT EYE   UTERINE FIBROID SURGERY     Patient Active Problem List   Diagnosis Date Noted   CKD (chronic kidney disease) 06/28/2018   Chronic systolic CHF (congestive heart failure) (HCC) 06/28/2018   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 06/28/2018   Nephropathy associated with another disease 06/28/2018   Lumbago 06/28/2018   Vitamin D deficiency, unspecified 06/28/2018   Routine general medical examination at a health care facility 06/28/2018   Dry eye syndrome 06/28/2018   Imbalance 10/04/2017    Diabetes (HCC) 08/09/2016   Left acoustic neuroma (HCC) 10/03/2015   Essential hypertension 03/29/2012   Chronic systolic dysfunction of left ventricle 11/24/2011   ACS (acute coronary syndrome) (HCC) 11/23/2011   CHF, acute (HCC) 11/23/2011   Cocaine abuse (HCC) 11/23/2011   Tobacco abuse 11/23/2011   Pneumonitis, aspiration (HCC) 11/23/2011   Cardiomyopathy (HCC) 11/23/2011   HTN (hypertension), malignant 11/23/2011    PCP: Renaye Rakers, MD   REFERRING PROVIDER: Estanislado Spire PA  REFERRING DIAG: L4 radiculopathy  Rationale for Evaluation and Treatment: Rehabilitation  THERAPY DIAG:  Other low back pain  Muscle weakness (generalized)  Other abnormalities of gait and mobility  ONSET DATE: chronic  SUBJECTIVE:  SUBJECTIVE STATEMENT: Feels less pain and discomfort since last session.  Able to mobilize with less restriction.  PERTINENT HISTORY:  None available  PAIN:  Are you having pain? Yes: NPRS scale: 10/10 Pain location: low back  Pain description: ache Aggravating factors: twisting, sudden movements Relieving factors: position changes  PRECAUTIONS: None  RED FLAGS: None   WEIGHT BEARING RESTRICTIONS: No  FALLS:  Has patient fallen in last 6 months? Yes. Number of falls 6+  OCCUPATION: part time house keeping  PLOF: Independent  PATIENT GOALS: To reduce and manage my back pain  NEXT MD VISIT: as needed  OBJECTIVE:   DIAGNOSTIC FINDINGS:  None recent  PATIENT SURVEYS:  ODI TBD; 05/24/23 13     MUSCLE LENGTH: Hamstrings: Right 90 deg; Left 90 deg PKB unremarkable B  POSTURE: decreased lumbar lordosis  PALPATION: Point tender over B greater trochanters and B gluteus medius  LUMBAR ROM:   AROM eval  Flexion 75%  Extension 50% P!  Right lateral flexion  50%  Left lateral flexion 50%  Right rotation   Left rotation    (Blank rows = not tested)  LOWER EXTREMITY ROM:   WNL B  Active  Right eval Left eval  Hip flexion    Hip extension    Hip abduction    Hip adduction    Hip internal rotation    Hip external rotation    Knee flexion    Knee extension    Ankle dorsiflexion    Ankle plantarflexion    Ankle inversion    Ankle eversion     (Blank rows = not tested)  LOWER EXTREMITY MMT:    MMT Right eval Left eval  Hip flexion 4 4  Hip extension 4 4  Hip abduction 4 4  Hip adduction    Hip internal rotation    Hip external rotation    Knee flexion 4 4  Knee extension 4 4  Ankle dorsiflexion    Ankle plantarflexion 4 4  Ankle inversion    Ankle eversion     (Blank rows = not tested)  LUMBAR SPECIAL TESTS:  Straight leg raise test: Negative, Slump test: Negative, and Stork standing: Negative  FUNCTIONAL TESTS:  5 times sit to stand: 9s arms crossed  GAIT: Distance walked: 56ftx2 Assistive device utilized: None Level of assistance: Complete Independence Comments: minimal trunk rotation  TODAY'S TREATMENT:            [facility] Adult PT Treatment:                                                DATE: 05/24/23 Therapeutic Exercise: Nustep L2 8 min Prone prop 2 min QL stretch 30s x2 Piriformis stretch push 30s x2 Figure 4 bridge 15/15 Plank on knees 30s x2 PA mobs L5-2 Grade II 3x10 per segment  DATE: 05/20/23 Eval    PATIENT EDUCATION:  Education details: Discussed eval findings, rehab rationale and POC and patient is in agreement  Person educated: Patient Education method: Explanation Education comprehension: verbalized understanding and needs further education  HOME EXERCISE PROGRAM: Access Code: [email or web address] URL: [email or web address] Date: 05/23/2023 Prepared by: Gustavus Bryant  Exercises - Sidelying Open Book Thoracic Lumbar Rotation and Extension  - 2 x daily - 5 x weekly - 1 sets - 10 reps - Supine Figure 4 Piriformis Stretch  - 2 x daily - 5 x weekly - 1 sets - 2 reps - 30s hold - Static Prone on Elbows  - 2 x daily - 5 x weekly - 1 sets - 1 reps - 2 min hold - Plank on Knees  - 1 x daily - 5 x weekly - 1 sets - 2 reps - 30s hold  ASSESSMENT:  CLINICAL IMPRESSION: First f/u session with less discomfort noted.  Reviewed HEP and stretch, added aerobic work and incorporated core strength.  PA mobs to lumbar spine find an increase in spring/mobility.  (Eval)Patient is a 62 y.o. female who was seen today for physical therapy evaluation and treatment for chronic low back pain. Finding show full LE mobility, no nerve tension signs and strength deficits in trunk and BLEs.  Lumbar spring testing finds limited intersegmental mobility and subsequent loss of trunk ROM.  Patient would benefit from OPPT to address spinal stiffness and trunk/LE weakness.   OBJECTIVE IMPAIRMENTS: Abnormal gait, decreased activity tolerance, decreased balance, decreased knowledge of condition, decreased mobility, difficulty walking, decreased strength, hypomobility, impaired flexibility, improper body mechanics, and pain.   ACTIVITY LIMITATIONS: carrying, lifting, sitting, standing, squatting, and stairs  PERSONAL FACTORS: Age, Fitness, and Time since onset of injury/illness/exacerbation are also affecting patient's functional outcome.   REHAB POTENTIAL: Good  CLINICAL DECISION MAKING: Stable/uncomplicated  EVALUATION COMPLEXITY: Low   GOALS: Goals reviewed with patient? No  SHORT TERM GOALS: Target date: 06/10/2023  Patient to demonstrate independence in HEP  Baseline: [id number] Goal status: INITIAL   LONG TERM GOALS: Target date: 07/01/2023    Decrease pain to 6/10 Baseline: 10/10 Goal status: INITIAL  2.  Increase trunk ROM to 75% Baseline:  AROM eval  Flexion 75%   Extension 50% P!  Right lateral flexion 50%  Left lateral flexion 50%  Right rotation   Left rotation    Goal status: INITIAL  3.  Increase BLE strength to 4+/5 Baseline:  MMT Right eval Left eval  Hip flexion 4 4  Hip extension 4 4  Hip abduction 4 4  Hip adduction    Hip internal rotation    Hip external rotation    Knee flexion 4 4  Knee extension 4 4  Ankle dorsiflexion    Ankle plantarflexion 4 4   Goal status: INITIAL  4.  Obtain FOTO/ODI and determine functional level; 05/24/23 Goal is 10% Baseline: TBD; 05/24/23 13% Goal status: INITIAL    PLAN:  PT FREQUENCY: 1-2x/week  PT DURATION: 6 weeks  PLANNED INTERVENTIONS: Therapeutic exercises, Therapeutic activity, Neuromuscular re-education, Balance training, Gait training, Patient/Family education, Self Care, Joint mobilization, Stair training, Aquatic Therapy, Dry Needling, Electrical stimulation, Spinal mobilization, Cryotherapy, Moist heat, Manual therapy, and Re-evaluation.  PLAN FOR NEXT SESSION: HEP review and update, manual techniques as appropriate, aerobic tasks, ROM and flexibility activities, strengthening and PREs, TPDN, gait and balance training as needed     Hildred Laser, PT 05/24/2023, 5:13 PM

## 2023-05-30 NOTE — Therapy (Deleted)
OUTPATIENT PHYSICAL THERAPY THORACOLUMBAR EVALUATION   Patient Name: [name] MRN: [id number] DOB:October 09, 1961, 62 y.o., female Today's Date: 05/30/2023  END OF SESSION:     Past Medical History:  Diagnosis Date   Arthritis    Bowel obstruction (HCC)    Bronchitis    CHF (congestive heart failure) (HCC)    Chronic systolic dysfunction of left ventricle    Diabetes mellitus without complication (HCC)    on oral meds   GERD (gastroesophageal reflux disease)    Headache(784.0)    migraines, hasn't had one for over a year   History of noncompliance with medical treatment    Hypercholesteremia    Hypertension    LVH (left ventricular hypertrophy)    Nonischemic cardiomyopathy (HCC)    Polysubstance abuse (HCC)    Tobacco abuse disorder    Past Surgical History:  Procedure Laterality Date   ABDOMINAL HYSTERECTOMY     ABDOMINAL SURGERY     left foot surgery     STRABISMUS SURGERY Left 07/31/2013   Procedure: REPAIR STRABISMUS;  Surgeon: Shara Blazing, MD;  Location: [facility] OR;  Service: Ophthalmology;  Laterality: Left;  EYE MUSCLE SURGERY LEFT EYE   UTERINE FIBROID SURGERY     Patient Active Problem List   Diagnosis Date Noted   CKD (chronic kidney disease) 06/28/2018   Chronic systolic CHF (congestive heart failure) (HCC) 06/28/2018   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 06/28/2018   Nephropathy associated with another disease 06/28/2018   Lumbago 06/28/2018   Vitamin D deficiency, unspecified 06/28/2018   Routine general medical examination at a health care facility 06/28/2018   Dry eye syndrome 06/28/2018   Imbalance 10/04/2017   Diabetes (HCC) 08/09/2016   Left acoustic neuroma (HCC) 10/03/2015   Essential hypertension 03/29/2012   Chronic systolic dysfunction of left ventricle 11/24/2011   ACS (acute coronary syndrome) (HCC) 11/23/2011   CHF, acute (HCC) 11/23/2011   Cocaine abuse (HCC) 11/23/2011   Tobacco abuse 11/23/2011    Pneumonitis, aspiration (HCC) 11/23/2011   Cardiomyopathy (HCC) 11/23/2011   HTN (hypertension), malignant 11/23/2011    PCP: Renaye Rakers, MD   REFERRING PROVIDER: Estanislado Spire PA  REFERRING DIAG: L4 radiculopathy  Rationale for Evaluation and Treatment: Rehabilitation  THERAPY DIAG:  No diagnosis found.  ONSET DATE: chronic  SUBJECTIVE:                                                                                                                                                                                           SUBJECTIVE STATEMENT: Feels less pain and discomfort since last session.  Able to mobilize with  less restriction.  PERTINENT HISTORY:  None available  PAIN:  Are you having pain? Yes: NPRS scale: 10/10 Pain location: low back  Pain description: ache Aggravating factors: twisting, sudden movements Relieving factors: position changes  PRECAUTIONS: None  RED FLAGS: None   WEIGHT BEARING RESTRICTIONS: No  FALLS:  Has patient fallen in last 6 months? Yes. Number of falls 6+  OCCUPATION: part time house keeping  PLOF: Independent  PATIENT GOALS: To reduce and manage my back pain  NEXT MD VISIT: as needed  OBJECTIVE:   DIAGNOSTIC FINDINGS:  None recent  PATIENT SURVEYS:  ODI TBD; 05/24/23 13     MUSCLE LENGTH: Hamstrings: Right 90 deg; Left 90 deg PKB unremarkable B  POSTURE: decreased lumbar lordosis  PALPATION: Point tender over B greater trochanters and B gluteus medius  LUMBAR ROM:   AROM eval  Flexion 75%  Extension 50% P!  Right lateral flexion 50%  Left lateral flexion 50%  Right rotation   Left rotation    (Blank rows = not tested)  LOWER EXTREMITY ROM:   WNL B  Active  Right eval Left eval  Hip flexion    Hip extension    Hip abduction    Hip adduction    Hip internal rotation    Hip external rotation    Knee flexion    Knee extension    Ankle dorsiflexion    Ankle plantarflexion    Ankle inversion     Ankle eversion     (Blank rows = not tested)  LOWER EXTREMITY MMT:    MMT Right eval Left eval  Hip flexion 4 4  Hip extension 4 4  Hip abduction 4 4  Hip adduction    Hip internal rotation    Hip external rotation    Knee flexion 4 4  Knee extension 4 4  Ankle dorsiflexion    Ankle plantarflexion 4 4  Ankle inversion    Ankle eversion     (Blank rows = not tested)  LUMBAR SPECIAL TESTS:  Straight leg raise test: Negative, Slump test: Negative, and Stork standing: Negative  FUNCTIONAL TESTS:  5 times sit to stand: 9s arms crossed  GAIT: Distance walked: 4ftx2 Assistive device utilized: None Level of assistance: Complete Independence Comments: minimal trunk rotation  TODAY'S TREATMENT:            [facility] Adult PT Treatment:                                                DATE: 05/24/23 Therapeutic Exercise: Nustep L2 8 min Prone prop 2 min QL stretch 30s x2 Piriformis stretch push 30s x2 Figure 4 bridge 15/15 Plank on knees 30s x2 PA mobs L5-2 Grade II 3x10 per segment  DATE: 05/20/23 Eval    PATIENT EDUCATION:  Education details: Discussed eval findings, rehab rationale and POC and patient is in agreement  Person educated: Patient Education method: Explanation Education comprehension: verbalized understanding and needs further education  HOME EXERCISE PROGRAM: Access Code: [email or web address] URL: [email or web address] Date: 05/23/2023 Prepared by: Gustavus Bryant  Exercises - Sidelying Open Book Thoracic Lumbar Rotation and Extension  - 2 x daily - 5 x weekly - 1 sets - 10 reps - Supine Figure 4 Piriformis Stretch  - 2 x daily - 5 x weekly - 1 sets - 2 reps - 30s hold - Static Prone on Elbows  - 2 x daily - 5 x weekly - 1 sets - 1 reps - 2 min hold - Plank on Knees  - 1 x daily - 5 x weekly - 1 sets - 2 reps - 30s  hold  ASSESSMENT:  CLINICAL IMPRESSION: First f/u session with less discomfort noted.  Reviewed HEP and stretch, added aerobic work and incorporated core strength.  PA mobs to lumbar spine find an increase in spring/mobility.  (Eval)Patient is a 62 y.o. female who was seen today for physical therapy evaluation and treatment for chronic low back pain. Finding show full LE mobility, no nerve tension signs and strength deficits in trunk and BLEs.  Lumbar spring testing finds limited intersegmental mobility and subsequent loss of trunk ROM.  Patient would benefit from OPPT to address spinal stiffness and trunk/LE weakness.   OBJECTIVE IMPAIRMENTS: Abnormal gait, decreased activity tolerance, decreased balance, decreased knowledge of condition, decreased mobility, difficulty walking, decreased strength, hypomobility, impaired flexibility, improper body mechanics, and pain.   ACTIVITY LIMITATIONS: carrying, lifting, sitting, standing, squatting, and stairs  PERSONAL FACTORS: Age, Fitness, and Time since onset of injury/illness/exacerbation are also affecting patient's functional outcome.   REHAB POTENTIAL: Good  CLINICAL DECISION MAKING: Stable/uncomplicated  EVALUATION COMPLEXITY: Low   GOALS: Goals reviewed with patient? No  SHORT TERM GOALS: Target date: 06/10/2023  Patient to demonstrate independence in HEP  Baseline: [id number] Goal status: INITIAL   LONG TERM GOALS: Target date: 07/01/2023    Decrease pain to 6/10 Baseline: 10/10 Goal status: INITIAL  2.  Increase trunk ROM to 75% Baseline:  AROM eval  Flexion 75%  Extension 50% P!  Right lateral flexion 50%  Left lateral flexion 50%  Right rotation   Left rotation    Goal status: INITIAL  3.  Increase BLE strength to 4+/5 Baseline:  MMT Right eval Left eval  Hip flexion 4 4  Hip extension 4 4  Hip abduction 4 4  Hip adduction    Hip internal rotation    Hip external rotation    Knee flexion 4 4  Knee  extension 4 4  Ankle dorsiflexion    Ankle plantarflexion 4 4   Goal status: INITIAL  4.  Obtain FOTO/ODI and determine functional level; 05/24/23 Goal is 10% Baseline: TBD; 05/24/23 13% Goal status: INITIAL    PLAN:  PT FREQUENCY: 1-2x/week  PT DURATION: 6 weeks  PLANNED INTERVENTIONS: Therapeutic exercises, Therapeutic activity, Neuromuscular re-education, Balance training, Gait training, Patient/Family education, Self Care, Joint mobilization, Stair training, Aquatic Therapy, Dry Needling, Electrical stimulation, Spinal mobilization, Cryotherapy, Moist heat, Manual therapy, and Re-evaluation.  PLAN FOR NEXT SESSION: HEP review and update, manual techniques as appropriate, aerobic tasks, ROM and flexibility activities, strengthening and PREs, TPDN, gait and balance training as needed     Hildred Laser, PT 05/30/2023, 9:03 AM

## 2023-05-31 ENCOUNTER — Ambulatory Visit: Payer: 59

## 2023-06-02 DIAGNOSIS — Z0001 Encounter for general adult medical examination with abnormal findings: Secondary | ICD-10-CM | POA: Diagnosis not present

## 2023-06-02 DIAGNOSIS — M5090 Cervical disc disorder, unspecified, unspecified cervical region: Secondary | ICD-10-CM | POA: Diagnosis not present

## 2023-06-02 DIAGNOSIS — R5383 Other fatigue: Secondary | ICD-10-CM | POA: Diagnosis not present

## 2023-06-06 NOTE — Therapy (Unsigned)
OUTPATIENT PHYSICAL THERAPY THORACOLUMBAR EVALUATION   Patient Name: [name] MRN: [id number] DOB:September 26, 1961, 62 y.o., female Today's Date: 06/08/2023  END OF SESSION:  PT End of Session - 06/08/23 1453     Visit Number 3    Number of Visits 6    Date for PT Re-Evaluation 07/21/23    Authorization Type [employer] MCR    PT Start Time 1450    PT Stop Time 1530    PT Time Calculation (min) 40 min    Activity Tolerance Patient tolerated treatment well    Behavior During Therapy Impulsive;WFL for tasks assessed/performed               Past Medical History:  Diagnosis Date   Arthritis    Bowel obstruction (HCC)    Bronchitis    CHF (congestive heart failure) (HCC)    Chronic systolic dysfunction of left ventricle    Diabetes mellitus without complication (HCC)    on oral meds   GERD (gastroesophageal reflux disease)    Headache(784.0)    migraines, hasn't had one for over a year   History of noncompliance with medical treatment    Hypercholesteremia    Hypertension    LVH (left ventricular hypertrophy)    Nonischemic cardiomyopathy (HCC)    Polysubstance abuse (HCC)    Tobacco abuse disorder    Past Surgical History:  Procedure Laterality Date   ABDOMINAL HYSTERECTOMY     ABDOMINAL SURGERY     left foot surgery     STRABISMUS SURGERY Left 07/31/2013   Procedure: REPAIR STRABISMUS;  Surgeon: Shara Blazing, MD;  Location: [facility] OR;  Service: Ophthalmology;  Laterality: Left;  EYE MUSCLE SURGERY LEFT EYE   UTERINE FIBROID SURGERY     Patient Active Problem List   Diagnosis Date Noted   CKD (chronic kidney disease) 06/28/2018   Chronic systolic CHF (congestive heart failure) (HCC) 06/28/2018   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 06/28/2018   Nephropathy associated with another disease 06/28/2018   Lumbago 06/28/2018   Vitamin D deficiency, unspecified 06/28/2018   Routine general medical examination at a health care facility  06/28/2018   Dry eye syndrome 06/28/2018   Imbalance 10/04/2017   Diabetes (HCC) 08/09/2016   Left acoustic neuroma (HCC) 10/03/2015   Essential hypertension 03/29/2012   Chronic systolic dysfunction of left ventricle 11/24/2011   ACS (acute coronary syndrome) (HCC) 11/23/2011   CHF, acute (HCC) 11/23/2011   Cocaine abuse (HCC) 11/23/2011   Tobacco abuse 11/23/2011   Pneumonitis, aspiration (HCC) 11/23/2011   Cardiomyopathy (HCC) 11/23/2011   HTN (hypertension), malignant 11/23/2011    PCP: Renaye Rakers, MD   REFERRING PROVIDER: Estanislado Spire PA  REFERRING DIAG: L4 radiculopathy  Rationale for Evaluation and Treatment: Rehabilitation  THERAPY DIAG:  Other low back pain  Muscle weakness (generalized)  Other abnormalities of gait and mobility  ONSET DATE: chronic  SUBJECTIVE:  SUBJECTIVE STATEMENT: Feels less pain and discomfort since last session.  Able to mobilize with less restriction.  PERTINENT HISTORY:  None available  PAIN:  Are you having pain? Yes: NPRS scale: 10/10 Pain location: low back  Pain description: ache Aggravating factors: twisting, sudden movements Relieving factors: position changes  PRECAUTIONS: None  RED FLAGS: None   WEIGHT BEARING RESTRICTIONS: No  FALLS:  Has patient fallen in last 6 months? Yes. Number of falls 6+  OCCUPATION: part time house keeping  PLOF: Independent  PATIENT GOALS: To reduce and manage my back pain  NEXT MD VISIT: as needed  OBJECTIVE:   DIAGNOSTIC FINDINGS:  None recent  PATIENT SURVEYS:  ODI TBD; 05/24/23 13     MUSCLE LENGTH: Hamstrings: Right 90 deg; Left 90 deg PKB unremarkable B  POSTURE: decreased lumbar lordosis  PALPATION: Point tender over B greater trochanters and B gluteus medius  LUMBAR ROM:    AROM eval  Flexion 75%  Extension 50% P!  Right lateral flexion 50%  Left lateral flexion 50%  Right rotation   Left rotation    (Blank rows = not tested)  LOWER EXTREMITY ROM:   WNL B  Active  Right eval Left eval  Hip flexion    Hip extension    Hip abduction    Hip adduction    Hip internal rotation    Hip external rotation    Knee flexion    Knee extension    Ankle dorsiflexion    Ankle plantarflexion    Ankle inversion    Ankle eversion     (Blank rows = not tested)  LOWER EXTREMITY MMT:    MMT Right eval Left eval  Hip flexion 4 4  Hip extension 4 4  Hip abduction 4 4  Hip adduction    Hip internal rotation    Hip external rotation    Knee flexion 4 4  Knee extension 4 4  Ankle dorsiflexion    Ankle plantarflexion 4 4  Ankle inversion    Ankle eversion     (Blank rows = not tested)  LUMBAR SPECIAL TESTS:  Straight leg raise test: Negative, Slump test: Negative, and Stork standing: Negative  FUNCTIONAL TESTS:  5 times sit to stand: 9s arms crossed  GAIT: Distance walked: 60ftx2 Assistive device utilized: None Level of assistance: Complete Independence Comments: minimal trunk rotation  TODAY'S TREATMENT:           [facility] Adult PT Treatment:                                                DATE: 06/07/23 Therapeutic Exercise: Nustep L4 8 min Prone on elbows 2 min hold Curl up w/p-ball 15x QL stretch 30s x2 Figure 4 bridge 15/15 Dead bug w/p-ball 10/10  Manual Therapy: PA mobs L5-2 Grade II 3x10 per segment  Prone press 10x f/b 10x with PT OP  [facility] Adult PT Treatment:                                                DATE: 05/24/23 Therapeutic Exercise: Nustep L2 8 min Prone prop 2 min QL stretch 30s x2 Piriformis stretch push 30s x2 Figure 4 bridge 15/15 Plank on knees  30s x2 PA mobs L5-2 Grade II 3x10 per segment                                                                                                                   DATE:  05/20/23 Eval    PATIENT EDUCATION:  Education details: Discussed eval findings, rehab rationale and POC and patient is in agreement  Person educated: Patient Education method: Explanation Education comprehension: verbalized understanding and needs further education  HOME EXERCISE PROGRAM: Access Code: [email or web address] URL: [email or web address] Date: 05/23/2023 Prepared by: Gustavus Bryant  Exercises - Sidelying Open Book Thoracic Lumbar Rotation and Extension  - 2 x daily - 5 x weekly - 1 sets - 10 reps - Supine Figure 4 Piriformis Stretch  - 2 x daily - 5 x weekly - 1 sets - 2 reps - 30s hold - Static Prone on Elbows  - 2 x daily - 5 x weekly - 1 sets - 1 reps - 2 min hold - Plank on Knees  - 1 x daily - 5 x weekly - 1 sets - 2 reps - 30s hold  ASSESSMENT:  CLINICAL IMPRESSION: Low back symptoms minimal.  Advanced abdominal and core exercises increasing difficulty in challenge as noted.  Demonstrates good lumbar extension following stretching and manual interventions  (Eval)Patient is a 62 y.o. female who was seen today for physical therapy evaluation and treatment for chronic low back pain. Finding show full LE mobility, no nerve tension signs and strength deficits in trunk and BLEs.  Lumbar spring testing finds limited intersegmental mobility and subsequent loss of trunk ROM.  Patient would benefit from OPPT to address spinal stiffness and trunk/LE weakness.   OBJECTIVE IMPAIRMENTS: Abnormal gait, decreased activity tolerance, decreased balance, decreased knowledge of condition, decreased mobility, difficulty walking, decreased strength, hypomobility, impaired flexibility, improper body mechanics, and pain.   ACTIVITY LIMITATIONS: carrying, lifting, sitting, standing, squatting, and stairs  PERSONAL FACTORS: Age, Fitness, and Time since onset of injury/illness/exacerbation are also affecting patient's functional outcome.   REHAB POTENTIAL: Good  CLINICAL DECISION MAKING:  Stable/uncomplicated  EVALUATION COMPLEXITY: Low   GOALS: Goals reviewed with patient? No  SHORT TERM GOALS: Target date: 06/10/2023  Patient to demonstrate independence in HEP  Baseline: [id number] Goal status: INITIAL   LONG TERM GOALS: Target date: 07/01/2023    Decrease pain to 6/10 Baseline: 10/10 Goal status: INITIAL  2.  Increase trunk ROM to 75% Baseline:  AROM eval  Flexion 75%  Extension 50% P!  Right lateral flexion 50%  Left lateral flexion 50%  Right rotation   Left rotation    Goal status: INITIAL  3.  Increase BLE strength to 4+/5 Baseline:  MMT Right eval Left eval  Hip flexion 4 4  Hip extension 4 4  Hip abduction 4 4  Hip adduction    Hip internal rotation    Hip external rotation    Knee flexion 4 4  Knee extension 4 4  Ankle dorsiflexion    Ankle plantarflexion 4 4  Goal status: INITIAL  4.  Obtain FOTO/ODI and determine functional level; 05/24/23 Goal is 10% Baseline: TBD; 05/24/23 13% Goal status: INITIAL    PLAN:  PT FREQUENCY: 1-2x/week  PT DURATION: 6 weeks  PLANNED INTERVENTIONS: Therapeutic exercises, Therapeutic activity, Neuromuscular re-education, Balance training, Gait training, Patient/Family education, Self Care, Joint mobilization, Stair training, Aquatic Therapy, Dry Needling, Electrical stimulation, Spinal mobilization, Cryotherapy, Moist heat, Manual therapy, and Re-evaluation.  PLAN FOR NEXT SESSION: HEP review and update, manual techniques as appropriate, aerobic tasks, ROM and flexibility activities, strengthening and PREs, TPDN, gait and balance training as needed     Hildred Laser, PT 06/08/2023, 3:42 PM

## 2023-06-08 ENCOUNTER — Ambulatory Visit: Payer: 59 | Attending: Family Medicine

## 2023-06-08 DIAGNOSIS — M6281 Muscle weakness (generalized): Secondary | ICD-10-CM | POA: Diagnosis not present

## 2023-06-08 DIAGNOSIS — R2689 Other abnormalities of gait and mobility: Secondary | ICD-10-CM | POA: Diagnosis not present

## 2023-06-08 DIAGNOSIS — M5459 Other low back pain: Secondary | ICD-10-CM | POA: Diagnosis not present

## 2023-06-13 NOTE — Therapy (Unsigned)
OUTPATIENT PHYSICAL THERAPY THORACOLUMBAR EVALUATION   Patient Name: [name] MRN: [id number] DOB:1961-04-26, 62 y.o., female Today's Date: 06/15/2023  END OF SESSION:  PT End of Session - 06/15/23 1610     Visit Number 4    Number of Visits 6    Date for PT Re-Evaluation 07/21/23    Authorization Type U[employer] MCR    PT Start Time 1615    PT Stop Time 1655    PT Time Calculation (min) 40 min    Activity Tolerance Patient tolerated treatment well    Behavior During Therapy Impulsive;WFL for tasks assessed/performed                Past Medical History:  Diagnosis Date   Arthritis    Bowel obstruction (HCC)    Bronchitis    CHF (congestive heart failure) (HCC)    Chronic systolic dysfunction of left ventricle    Diabetes mellitus without complication (HCC)    on oral meds   GERD (gastroesophageal reflux disease)    Headache(784.0)    migraines, hasn't had one for over a year   History of noncompliance with medical treatment    Hypercholesteremia    Hypertension    LVH (left ventricular hypertrophy)    Nonischemic cardiomyopathy (HCC)    Polysubstance abuse (HCC)    Tobacco abuse disorder    Past Surgical History:  Procedure Laterality Date   ABDOMINAL HYSTERECTOMY     ABDOMINAL SURGERY     left foot surgery     STRABISMUS SURGERY Left 07/31/2013   Procedure: REPAIR STRABISMUS;  Surgeon: Shara Blazing, MD;  Location: [facility] OR;  Service: Ophthalmology;  Laterality: Left;  EYE MUSCLE SURGERY LEFT EYE   UTERINE FIBROID SURGERY     Patient Active Problem List   Diagnosis Date Noted   CKD (chronic kidney disease) 06/28/2018   Chronic systolic CHF (congestive heart failure) (HCC) 06/28/2018   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 06/28/2018   Nephropathy associated with another disease 06/28/2018   Lumbago 06/28/2018   Vitamin D deficiency, unspecified 06/28/2018   Routine general medical examination at a health care facility  06/28/2018   Dry eye syndrome 06/28/2018   Imbalance 10/04/2017   Diabetes (HCC) 08/09/2016   Left acoustic neuroma (HCC) 10/03/2015   Essential hypertension 03/29/2012   Chronic systolic dysfunction of left ventricle 11/24/2011   ACS (acute coronary syndrome) (HCC) 11/23/2011   CHF, acute (HCC) 11/23/2011   Cocaine abuse (HCC) 11/23/2011   Tobacco abuse 11/23/2011   Pneumonitis, aspiration (HCC) 11/23/2011   Cardiomyopathy (HCC) 11/23/2011   HTN (hypertension), malignant 11/23/2011    PCP: Renaye Rakers, MD   REFERRING PROVIDER: Estanislado Spire PA  REFERRING DIAG: L4 radiculopathy  Rationale for Evaluation and Treatment: Rehabilitation  THERAPY DIAG:  Other low back pain  Muscle weakness (generalized)  Other abnormalities of gait and mobility  ONSET DATE: chronic  SUBJECTIVE:  SUBJECTIVE STATEMENT: Feels less pain and discomfort since last session.  Able to mobilize with less restriction.  PERTINENT HISTORY:  None available  PAIN:  Are you having pain? Yes: NPRS scale: 10/10 Pain location: low back  Pain description: ache Aggravating factors: twisting, sudden movements Relieving factors: position changes  PRECAUTIONS: None  RED FLAGS: None   WEIGHT BEARING RESTRICTIONS: No  FALLS:  Has patient fallen in last 6 months? Yes. Number of falls 6+  OCCUPATION: part time house keeping  PLOF: Independent  PATIENT GOALS: To reduce and manage my back pain  NEXT MD VISIT: as needed  OBJECTIVE:   DIAGNOSTIC FINDINGS:  None recent  PATIENT SURVEYS:  ODI TBD; 05/24/23 13     MUSCLE LENGTH: Hamstrings: Right 90 deg; Left 90 deg PKB unremarkable B  POSTURE: decreased lumbar lordosis  PALPATION: Point tender over B greater trochanters and B gluteus medius  LUMBAR ROM:    AROM eval  Flexion 75%  Extension 50% P!  Right lateral flexion 50%  Left lateral flexion 50%  Right rotation   Left rotation    (Blank rows = not tested)  LOWER EXTREMITY ROM:   WNL B  Active  Right eval Left eval  Hip flexion    Hip extension    Hip abduction    Hip adduction    Hip internal rotation    Hip external rotation    Knee flexion    Knee extension    Ankle dorsiflexion    Ankle plantarflexion    Ankle inversion    Ankle eversion     (Blank rows = not tested)  LOWER EXTREMITY MMT:    MMT Right eval Left eval  Hip flexion 4 4  Hip extension 4 4  Hip abduction 4 4  Hip adduction    Hip internal rotation    Hip external rotation    Knee flexion 4 4  Knee extension 4 4  Ankle dorsiflexion    Ankle plantarflexion 4 4  Ankle inversion    Ankle eversion     (Blank rows = not tested)  LUMBAR SPECIAL TESTS:  Straight leg raise test: Negative, Slump test: Negative, and Stork standing: Negative  FUNCTIONAL TESTS:  5 times sit to stand: 9s arms crossed  GAIT: Distance walked: 46ftx2 Assistive device utilized: None Level of assistance: Complete Independence Comments: minimal trunk rotation  TODAY'S TREATMENT:        [facility] Adult PT Treatment:                                                DATE: 06/15/23 Therapeutic Exercise: Nustep L2 8 min QL stertch 30s x2 B SKTC 30s x2 B P-ball curl ups 15x Manual Therapy: Skilled palpation to identify taught and irritable bands in L ilicostalis Trigger Point Dry Needling Treatment: Pre-treatment instruction: Patient instructed on dry needling rationale, procedures, and possible side effects including pain during treatment (achy,cramping feeling), bruising, drop of blood, lightheadedness, nausea, sweating. Patient Consent Given: Yes Education handout provided: Yes Muscles treated: L iliocostalis  Needle size and number: .25x33mm x 1 Electrical stimulation performed: No Parameters: N/A Treatment  response/outcome: Palpable decrease in muscle tension as well as mild paresthesias reported in region Post-treatment instructions: Patient instructed to expect possible mild to moderate muscle soreness later today and/or tomorrow. Patient instructed in methods to reduce muscle soreness and to  continue prescribed HEP. If patient was dry needled over the lung field, patient was instructed on signs and symptoms of pneumothorax and, however unlikely, to see immediate medical attention should they occur. Patient was also educated on signs and symptoms of infection and to seek medical attention should they occur. Patient verbalized understanding of these instructions and education.    [facility] Adult PT Treatment:                                                DATE: 06/07/23 Therapeutic Exercise: Nustep L4 8 min Prone on elbows 2 min hold Curl up w/p-ball 15x QL stretch 30s x2 Figure 4 bridge 15/15 Dead bug w/p-ball 10/10  Manual Therapy: PA mobs L5-2 Grade II 3x10 per segment  Prone press 10x f/b 10x with PT OP  [facility] Adult PT Treatment:                                                DATE: 05/24/23 Therapeutic Exercise: Nustep L2 8 min Prone prop 2 min QL stretch 30s x2 Piriformis stretch push 30s x2 Figure 4 bridge 15/15 Plank on knees 30s x2 PA mobs L5-2 Grade II 3x10 per segment                                                                                                                   DATE: 05/20/23 Eval    PATIENT EDUCATION:  Education details: Discussed eval findings, rehab rationale and POC and patient is in agreement  Person educated: Patient Education method: Explanation Education comprehension: verbalized understanding and needs further education  HOME EXERCISE PROGRAM: Access Code: [email or web address] URL: [email or web address] Date: 05/23/2023 Prepared by: Gustavus Bryant  Exercises - Sidelying Open Book Thoracic Lumbar Rotation and Extension  - 2 x daily - 5 x weekly -  1 sets - 10 reps - Supine Figure 4 Piriformis Stretch  - 2 x daily - 5 x weekly - 1 sets - 2 reps - 30s hold - Static Prone on Elbows  - 2 x daily - 5 x weekly - 1 sets - 1 reps - 2 min hold - Plank on Knees  - 1 x daily - 5 x weekly - 1 sets - 2 reps - 30s hold  ASSESSMENT:  CLINICAL IMPRESSION: Trial of TPDN today to L iliocostalis f/b gentle aerobic work and stertching.  No initial discomfort reported feeling "looser" after session.  Discussed potential side effect and aftereffects of procedure.  (Eval)Patient is a 62 y.o. female who was seen today for physical therapy evaluation and treatment for chronic low back pain. Finding show full LE mobility, no nerve tension signs and strength deficits in trunk and BLEs.  Lumbar spring testing finds limited intersegmental mobility and subsequent  loss of trunk ROM.  Patient would benefit from OPPT to address spinal stiffness and trunk/LE weakness.   OBJECTIVE IMPAIRMENTS: Abnormal gait, decreased activity tolerance, decreased balance, decreased knowledge of condition, decreased mobility, difficulty walking, decreased strength, hypomobility, impaired flexibility, improper body mechanics, and pain.   ACTIVITY LIMITATIONS: carrying, lifting, sitting, standing, squatting, and stairs  PERSONAL FACTORS: Age, Fitness, and Time since onset of injury/illness/exacerbation are also affecting patient's functional outcome.   REHAB POTENTIAL: Good  CLINICAL DECISION MAKING: Stable/uncomplicated  EVALUATION COMPLEXITY: Low   GOALS: Goals reviewed with patient? No  SHORT TERM GOALS: Target date: 06/10/2023  Patient to demonstrate independence in HEP  Baseline: [id number] Goal status: INITIAL   LONG TERM GOALS: Target date: 07/01/2023    Decrease pain to 6/10 Baseline: 10/10 Goal status: INITIAL  2.  Increase trunk ROM to 75% Baseline:  AROM eval  Flexion 75%  Extension 50% P!  Right lateral flexion 50%  Left lateral flexion 50%  Right rotation    Left rotation    Goal status: INITIAL  3.  Increase BLE strength to 4+/5 Baseline:  MMT Right eval Left eval  Hip flexion 4 4  Hip extension 4 4  Hip abduction 4 4  Hip adduction    Hip internal rotation    Hip external rotation    Knee flexion 4 4  Knee extension 4 4  Ankle dorsiflexion    Ankle plantarflexion 4 4   Goal status: INITIAL  4.  Obtain FOTO/ODI and determine functional level; 05/24/23 Goal is 10% Baseline: TBD; 05/24/23 13% Goal status: INITIAL    PLAN:  PT FREQUENCY: 1-2x/week  PT DURATION: 6 weeks  PLANNED INTERVENTIONS: Therapeutic exercises, Therapeutic activity, Neuromuscular re-education, Balance training, Gait training, Patient/Family education, Self Care, Joint mobilization, Stair training, Aquatic Therapy, Dry Needling, Electrical stimulation, Spinal mobilization, Cryotherapy, Moist heat, Manual therapy, and Re-evaluation.  PLAN FOR NEXT SESSION: HEP review and update, manual techniques as appropriate, aerobic tasks, ROM and flexibility activities, strengthening and PREs, TPDN, gait and balance training as needed     Hildred Laser, PT 06/15/2023, 5:11 PM

## 2023-06-15 ENCOUNTER — Ambulatory Visit: Payer: 59

## 2023-06-15 DIAGNOSIS — R2689 Other abnormalities of gait and mobility: Secondary | ICD-10-CM

## 2023-06-15 DIAGNOSIS — M6281 Muscle weakness (generalized): Secondary | ICD-10-CM

## 2023-06-15 DIAGNOSIS — M5459 Other low back pain: Secondary | ICD-10-CM | POA: Diagnosis not present

## 2023-06-15 DIAGNOSIS — M545 Low back pain, unspecified: Secondary | ICD-10-CM | POA: Diagnosis not present

## 2023-06-20 NOTE — Therapy (Unsigned)
OUTPATIENT PHYSICAL THERAPY THORACOLUMBAR EVALUATION   Patient Name: [name] MRN: [id number] DOB:Jul 14, 1961, 62 y.o., female Today's Date: 06/22/2023  END OF SESSION:  PT End of Session - 06/22/23 1218     Visit Number 5    Number of Visits 6    Date for PT Re-Evaluation 07/21/23    Authorization Type UHC MCR    PT Start Time 1215    PT Stop Time 1300    PT Time Calculation (min) 45 min    Activity Tolerance Patient tolerated treatment well    Behavior During Therapy Impulsive;WFL for tasks assessed/performed                 Past Medical History:  Diagnosis Date   Arthritis    Bowel obstruction (HCC)    Bronchitis    CHF (congestive heart failure) (HCC)    Chronic systolic dysfunction of left ventricle    Diabetes mellitus without complication (HCC)    on oral meds   GERD (gastroesophageal reflux disease)    Headache(784.0)    migraines, hasn't had one for over a year   History of noncompliance with medical treatment    Hypercholesteremia    Hypertension    LVH (left ventricular hypertrophy)    Nonischemic cardiomyopathy (HCC)    Polysubstance abuse (HCC)    Tobacco abuse disorder    Past Surgical History:  Procedure Laterality Date   ABDOMINAL HYSTERECTOMY     ABDOMINAL SURGERY     left foot surgery     STRABISMUS SURGERY Left 07/31/2013   Procedure: REPAIR STRABISMUS;  Surgeon: Shara Blazing, MD;  Location: [facility] OR;  Service: Ophthalmology;  Laterality: Left;  EYE MUSCLE SURGERY LEFT EYE   UTERINE FIBROID SURGERY     Patient Active Problem List   Diagnosis Date Noted   CKD (chronic kidney disease) 06/28/2018   Chronic systolic CHF (congestive heart failure) (HCC) 06/28/2018   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 06/28/2018   Nephropathy associated with another disease 06/28/2018   Lumbago 06/28/2018   Vitamin D deficiency, unspecified 06/28/2018   Routine general medical examination at a health care facility  06/28/2018   Dry eye syndrome 06/28/2018   Imbalance 10/04/2017   Diabetes (HCC) 08/09/2016   Left acoustic neuroma (HCC) 10/03/2015   Essential hypertension 03/29/2012   Chronic systolic dysfunction of left ventricle 11/24/2011   ACS (acute coronary syndrome) (HCC) 11/23/2011   CHF, acute (HCC) 11/23/2011   Cocaine abuse (HCC) 11/23/2011   Tobacco abuse 11/23/2011   Pneumonitis, aspiration (HCC) 11/23/2011   Cardiomyopathy (HCC) 11/23/2011   HTN (hypertension), malignant 11/23/2011    PCP: Renaye Rakers, MD   REFERRING PROVIDER: Estanislado Spire PA  REFERRING DIAG: L4 radiculopathy  Rationale for Evaluation and Treatment: Rehabilitation  THERAPY DIAG:  Other low back pain  Other abnormalities of gait and mobility  Muscle weakness (generalized)  ONSET DATE: chronic  SUBJECTIVE:  SUBJECTIVE STATEMENT: Feels TPDN beneficial, pain levels much improved.  PERTINENT HISTORY:  None available  PAIN:  Are you having pain? Yes: NPRS scale: 10/10 Pain location: low back  Pain description: ache Aggravating factors: twisting, sudden movements Relieving factors: position changes  PRECAUTIONS: None  RED FLAGS: None   WEIGHT BEARING RESTRICTIONS: No  FALLS:  Has patient fallen in last 6 months? Yes. Number of falls 6+  OCCUPATION: part time house keeping  PLOF: Independent  PATIENT GOALS: To reduce and manage my back pain  NEXT MD VISIT: as needed  OBJECTIVE:   DIAGNOSTIC FINDINGS:  None recent  PATIENT SURVEYS:  ODI TBD; 05/24/23 13     MUSCLE LENGTH: Hamstrings: Right 90 deg; Left 90 deg PKB unremarkable B  POSTURE: decreased lumbar lordosis  PALPATION: Point tender over B greater trochanters and B gluteus medius  LUMBAR ROM:   AROM eval  Flexion 75%  Extension 50%  P!  Right lateral flexion 50%  Left lateral flexion 50%  Right rotation   Left rotation    (Blank rows = not tested)  LOWER EXTREMITY ROM:   WNL B  Active  Right eval Left eval  Hip flexion    Hip extension    Hip abduction    Hip adduction    Hip internal rotation    Hip external rotation    Knee flexion    Knee extension    Ankle dorsiflexion    Ankle plantarflexion    Ankle inversion    Ankle eversion     (Blank rows = not tested)  LOWER EXTREMITY MMT:    MMT Right eval Left eval  Hip flexion 4 4  Hip extension 4 4  Hip abduction 4 4  Hip adduction    Hip internal rotation    Hip external rotation    Knee flexion 4 4  Knee extension 4 4  Ankle dorsiflexion    Ankle plantarflexion 4 4  Ankle inversion    Ankle eversion     (Blank rows = not tested)  LUMBAR SPECIAL TESTS:  Straight leg raise test: Negative, Slump test: Negative, and Stork standing: Negative  FUNCTIONAL TESTS:  5 times sit to stand: 9s arms crossed  GAIT: Distance walked: 59ftx2 Assistive device utilized: None Level of assistance: Complete Independence Comments: minimal trunk rotation  TODAY'S TREATMENT:        [facility] Adult PT Treatment:                                                DATE: 06/22/23 Therapeutic Exercise: Nustep L4 8 min Plank on knees 30s x2 P-ball dead bug 15/15 P-ball curl ups 15x Praying mantis 15x Manual Therapy: Skilled palpation to identify taught and irritable bands in R piriformis and B lumbar multifidi Trigger Point Dry Needling Treatment: Pre-treatment instruction: Patient instructed on dry needling rationale, procedures, and possible side effects including pain during treatment (achy,cramping feeling), bruising, drop of blood, lightheadedness, nausea, sweating. Patient Consent Given: Yes Education handout provided: Yes Muscles treated: R piriformis and B lumbar multifidi Needle size and number: .30x85mm x 3 Electrical stimulation performed:  No Parameters: N/A Treatment response/outcome: Palpable decrease in muscle tension as well as mild paresthesias reported in region Post-treatment instructions: Patient instructed to expect possible mild to moderate muscle soreness later today and/or tomorrow. Patient instructed in methods to reduce muscle soreness  and to continue prescribed HEP. If patient was dry needled over the lung field, patient was instructed on signs and symptoms of pneumothorax and, however unlikely, to see immediate medical attention should they occur. Patient was also educated on signs and symptoms of infection and to seek medical attention should they occur. Patient verbalized understanding of these instructions and education.   [facility] Adult PT Treatment:                                                DATE: 06/15/23 Therapeutic Exercise: Nustep L2 8 min QL stertch 30s x2 B SKTC 30s x2 B P-ball curl ups 15x Manual Therapy: Skilled palpation to identify taught and irritable bands in L ilicostalis Trigger Point Dry Needling Treatment: Pre-treatment instruction: Patient instructed on dry needling rationale, procedures, and possible side effects including pain during treatment (achy,cramping feeling), bruising, drop of blood, lightheadedness, nausea, sweating. Patient Consent Given: Yes Education handout provided: Yes Muscles treated: L iliocostalis  Needle size and number: .25x37mm x 1 Electrical stimulation performed: No Parameters: N/A Treatment response/outcome: Palpable decrease in muscle tension as well as mild paresthesias reported in region Post-treatment instructions: Patient instructed to expect possible mild to moderate muscle soreness later today and/or tomorrow. Patient instructed in methods to reduce muscle soreness and to continue prescribed HEP. If patient was dry needled over the lung field, patient was instructed on signs and symptoms of pneumothorax and, however unlikely, to see immediate medical attention  should they occur. Patient was also educated on signs and symptoms of infection and to seek medical attention should they occur. Patient verbalized understanding of these instructions and education.    [facility] Adult PT Treatment:                                                DATE: 06/07/23 Therapeutic Exercise: Nustep L4 8 min Prone on elbows 2 min hold Curl up w/p-ball 15x QL stretch 30s x2 Figure 4 bridge 15/15 Dead bug w/p-ball 10/10  Manual Therapy: PA mobs L5-2 Grade II 3x10 per segment  Prone press 10x f/b 10x with PT OP  [facility] Adult PT Treatment:                                                DATE: 05/24/23 Therapeutic Exercise: Nustep L2 8 min Prone prop 2 min QL stretch 30s x2 Piriformis stretch push 30s x2 Figure 4 bridge 15/15 Plank on knees 30s x2 PA mobs L5-2 Grade II 3x10 per segment  DATE: 05/20/23 Eval    PATIENT EDUCATION:  Education details: Discussed eval findings, rehab rationale and POC and patient is in agreement  Person educated: Patient Education method: Explanation Education comprehension: verbalized understanding and needs further education  HOME EXERCISE PROGRAM: Access Code: [email or web address] URL: [email or web address] Date: 05/23/2023 Prepared by: Gustavus Bryant  Exercises - Sidelying Open Book Thoracic Lumbar Rotation and Extension  - 2 x daily - 5 x weekly - 1 sets - 10 reps - Supine Figure 4 Piriformis Stretch  - 2 x daily - 5 x weekly - 1 sets - 2 reps - 30s hold - Static Prone on Elbows  - 2 x daily - 5 x weekly - 1 sets - 1 reps - 2 min hold - Plank on Knees  - 1 x daily - 5 x weekly - 1 sets - 2 reps - 30s hold  ASSESSMENT:  CLINICAL IMPRESSION: Continued with TPDN which has proved helpful.  Advanced resistance on aerobic work and added more challenging core tasks.  Difficulty maintaining 30s hold on planks and unable to  maintain plank on toes.  (Eval)Patient is a 62 y.o. female who was seen today for physical therapy evaluation and treatment for chronic low back pain. Finding show full LE mobility, no nerve tension signs and strength deficits in trunk and BLEs.  Lumbar spring testing finds limited intersegmental mobility and subsequent loss of trunk ROM.  Patient would benefit from OPPT to address spinal stiffness and trunk/LE weakness.   OBJECTIVE IMPAIRMENTS: Abnormal gait, decreased activity tolerance, decreased balance, decreased knowledge of condition, decreased mobility, difficulty walking, decreased strength, hypomobility, impaired flexibility, improper body mechanics, and pain.   ACTIVITY LIMITATIONS: carrying, lifting, sitting, standing, squatting, and stairs  PERSONAL FACTORS: Age, Fitness, and Time since onset of injury/illness/exacerbation are also affecting patient's functional outcome.   REHAB POTENTIAL: Good  CLINICAL DECISION MAKING: Stable/uncomplicated  EVALUATION COMPLEXITY: Low   GOALS: Goals reviewed with patient? No  SHORT TERM GOALS: Target date: 06/10/2023  Patient to demonstrate independence in HEP  Baseline: [id number] Goal status: INITIAL   LONG TERM GOALS: Target date: 07/01/2023    Decrease pain to 6/10 Baseline: 10/10 Goal status: INITIAL  2.  Increase trunk ROM to 75% Baseline:  AROM eval  Flexion 75%  Extension 50% P!  Right lateral flexion 50%  Left lateral flexion 50%  Right rotation   Left rotation    Goal status: INITIAL  3.  Increase BLE strength to 4+/5 Baseline:  MMT Right eval Left eval  Hip flexion 4 4  Hip extension 4 4  Hip abduction 4 4  Hip adduction    Hip internal rotation    Hip external rotation    Knee flexion 4 4  Knee extension 4 4  Ankle dorsiflexion    Ankle plantarflexion 4 4   Goal status: INITIAL  4.  Obtain FOTO/ODI and determine functional level; 05/24/23 Goal is 10% Baseline: TBD; 05/24/23 13% Goal status:  INITIAL    PLAN:  PT FREQUENCY: 1-2x/week  PT DURATION: 6 weeks  PLANNED INTERVENTIONS: Therapeutic exercises, Therapeutic activity, Neuromuscular re-education, Balance training, Gait training, Patient/Family education, Self Care, Joint mobilization, Stair training, Aquatic Therapy, Dry Needling, Electrical stimulation, Spinal mobilization, Cryotherapy, Moist heat, Manual therapy, and Re-evaluation.  PLAN FOR NEXT SESSION: HEP review and update, manual techniques as appropriate, aerobic tasks, ROM and flexibility activities, strengthening and PREs, TPDN, gait and balance training as needed     Hildred Laser, PT 06/22/2023, 1:13 PM

## 2023-06-22 ENCOUNTER — Ambulatory Visit: Payer: 59

## 2023-06-22 DIAGNOSIS — R2689 Other abnormalities of gait and mobility: Secondary | ICD-10-CM | POA: Diagnosis not present

## 2023-06-22 DIAGNOSIS — M6281 Muscle weakness (generalized): Secondary | ICD-10-CM | POA: Diagnosis not present

## 2023-06-22 DIAGNOSIS — M5459 Other low back pain: Secondary | ICD-10-CM | POA: Diagnosis not present

## 2023-06-24 ENCOUNTER — Other Ambulatory Visit: Payer: Self-pay | Admitting: Family Medicine

## 2023-06-24 DIAGNOSIS — N644 Mastodynia: Secondary | ICD-10-CM

## 2023-06-27 ENCOUNTER — Ambulatory Visit (INDEPENDENT_AMBULATORY_CARE_PROVIDER_SITE_OTHER): Payer: 59 | Admitting: Podiatry

## 2023-06-27 ENCOUNTER — Ambulatory Visit (INDEPENDENT_AMBULATORY_CARE_PROVIDER_SITE_OTHER): Payer: 59

## 2023-06-27 ENCOUNTER — Encounter: Payer: Self-pay | Admitting: Podiatry

## 2023-06-27 DIAGNOSIS — M2041 Other hammer toe(s) (acquired), right foot: Secondary | ICD-10-CM | POA: Diagnosis not present

## 2023-06-27 DIAGNOSIS — Q72899 Other reduction defects of unspecified lower limb: Secondary | ICD-10-CM

## 2023-06-27 DIAGNOSIS — M21612 Bunion of left foot: Secondary | ICD-10-CM

## 2023-06-27 DIAGNOSIS — M79672 Pain in left foot: Secondary | ICD-10-CM

## 2023-06-27 DIAGNOSIS — L84 Corns and callosities: Secondary | ICD-10-CM

## 2023-06-27 NOTE — Progress Notes (Signed)
Subjective:  Patient ID: [name], female    DOB: Jan 02, 1961,   MRN: [id number]  Chief Complaint  Patient presents with   Foot Pain    62 y.o. female presents for concern of second toe pain on the right. And deformity of her left foot. Relates the right foot has improved from previous. Today more concern for left foot pain. Relates all of her toes are tingling also relates painful callus and issues with her fourth toe hitting things or her shoes and getting irritated. History of brachymetatarsia. Relates history of reconstructive surgery on her right foot with Dr. Victorino Dike.  She does have  a history of right fourth toe amputation and brachymetatarsia as well.  States most of the pain is tingling in nature. Refuses gabapentin. Takes meloxicam which helps. She does relates she is getting closer to wanting surgery on the left foot.  . Denies any other pedal complaints. Denies n/v/f/c.   Past Medical History:  Diagnosis Date   Arthritis    Bowel obstruction (HCC)    Bronchitis    CHF (congestive heart failure) (HCC)    Chronic systolic dysfunction of left ventricle    Diabetes mellitus without complication (HCC)    on oral meds   GERD (gastroesophageal reflux disease)    Headache(784.0)    migraines, hasn't had one for over a year   History of noncompliance with medical treatment    Hypercholesteremia    Hypertension    LVH (left ventricular hypertrophy)    Nonischemic cardiomyopathy (HCC)    Polysubstance abuse (HCC)    Tobacco abuse disorder     Objective:  Physical Exam: Vascular: DP/PT pulses 2/4 bilateral. CFT <3 seconds. Normal hair growth on digits. No edema.  Skin. No lacerations or abrasions bilateral feet.  Musculoskeletal: MMT 5/5 bilateral lower extremities in DF, PF, Inversion and Eversion. Deceased ROM in DF of ankle joint. Right foot with surgical scars first second and third digits and amputation of fourth digit. Fifth digit hammered with hyperkeratosis.  Second  digit valgus deformity. No pain to palpation and some edema noted to second digit. Left foot HAV deformity with hammered digits 2-5. And brachymetatarsia of the fourth metatarsal with prominent dorsal fourth digit. Hyperkeratotic tissu enoted plantar third metatarsal head on the left. No pain to palpation around the HAV deformity.  Relates pain but no pain to palpation.  Neurological: Sensation intact to light touch.   Assessment:   1. Hammertoe of right foot   2. Acquired hammertoe of right foot   3. Left foot pain   4. Brachymetatarsia of fourth metatarsal bone   5. Callus of foot   6. Bunion of left foot       Plan:  Patient was evaluated and treated and all questions answered. -Xrays reviewed Right foot with retained hardware from first MPJ fusion as well as second and third digit hammertoe repairs. Fourth digit amptuation.Brachymetatrsia of of fourth metatarsal.  Left foot with severe HAV deformity and degenerative changes noted to the first MPJ. Noted valgus of the second and third digits as well and hammering. Dislocation of the third and near dislocation of the second MPJs. Brachymetatarsia noted of the fourth metatarsal.  -Discussed HAV, hammertoes, and foot deformity following surgery and treatment options;conservative and surgical management; risks, benefits, alternatives discussed. All patient's questions answered. -Discussed padding and wide shoe gear.   -Recommend continue with good supportive shoes and inserts.  -Discussed surgical options. Discussed surgical procedures needed to help with left foot pain  including first MPJ fusion, second and third metatarsal head resections and hammertoe repairs, and fourth digit amputation.   -Patient to return to office  as needed to discuss future surgery vs other options.     Louann Sjogren, DPM

## 2023-06-28 NOTE — Therapy (Deleted)
OUTPATIENT PHYSICAL THERAPY THORACOLUMBAR EVALUATION   Patient Name: [name] MRN: [id number] DOB:03-16-1961, 62 y.o., female Today's Date: 06/28/2023  END OF SESSION:        Past Medical History:  Diagnosis Date   Arthritis    Bowel obstruction (HCC)    Bronchitis    CHF (congestive heart failure) (HCC)    Chronic systolic dysfunction of left ventricle    Diabetes mellitus without complication (HCC)    on oral meds   GERD (gastroesophageal reflux disease)    Headache(784.0)    migraines, hasn't had one for over a year   History of noncompliance with medical treatment    Hypercholesteremia    Hypertension    LVH (left ventricular hypertrophy)    Nonischemic cardiomyopathy (HCC)    Polysubstance abuse (HCC)    Tobacco abuse disorder    Past Surgical History:  Procedure Laterality Date   ABDOMINAL HYSTERECTOMY     ABDOMINAL SURGERY     left foot surgery     STRABISMUS SURGERY Left 07/31/2013   Procedure: REPAIR STRABISMUS;  Surgeon: Shara Blazing, MD;  Location: [facility] OR;  Service: Ophthalmology;  Laterality: Left;  EYE MUSCLE SURGERY LEFT EYE   UTERINE FIBROID SURGERY     Patient Active Problem List   Diagnosis Date Noted   CKD (chronic kidney disease) 06/28/2018   Chronic systolic CHF (congestive heart failure) (HCC) 06/28/2018   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 06/28/2018   Nephropathy associated with another disease 06/28/2018   Lumbago 06/28/2018   Vitamin D deficiency, unspecified 06/28/2018   Routine general medical examination at a health care facility 06/28/2018   Dry eye syndrome 06/28/2018   Imbalance 10/04/2017   Diabetes (HCC) 08/09/2016   Left acoustic neuroma (HCC) 10/03/2015   Essential hypertension 03/29/2012   Chronic systolic dysfunction of left ventricle 11/24/2011   ACS (acute coronary syndrome) (HCC) 11/23/2011   CHF, acute (HCC) 11/23/2011   Cocaine abuse (HCC) 11/23/2011   Tobacco abuse  11/23/2011   Pneumonitis, aspiration (HCC) 11/23/2011   Cardiomyopathy (HCC) 11/23/2011   HTN (hypertension), malignant 11/23/2011    PCP: Renaye Rakers, MD   REFERRING PROVIDER: Estanislado Spire PA  REFERRING DIAG: L4 radiculopathy  Rationale for Evaluation and Treatment: Rehabilitation  THERAPY DIAG:  No diagnosis found.  ONSET DATE: chronic  SUBJECTIVE:                                                                                                                                                                                           SUBJECTIVE STATEMENT: Feels TPDN beneficial, pain levels much improved.  PERTINENT HISTORY:  None available  PAIN:  Are you having pain? Yes: NPRS scale: 10/10 Pain location: low back  Pain description: ache Aggravating factors: twisting, sudden movements Relieving factors: position changes  PRECAUTIONS: None  RED FLAGS: None   WEIGHT BEARING RESTRICTIONS: No  FALLS:  Has patient fallen in last 6 months? Yes. Number of falls 6+  OCCUPATION: part time house keeping  PLOF: Independent  PATIENT GOALS: To reduce and manage my back pain  NEXT MD VISIT: as needed  OBJECTIVE:   DIAGNOSTIC FINDINGS:  None recent  PATIENT SURVEYS:  ODI TBD; 05/24/23 13     MUSCLE LENGTH: Hamstrings: Right 90 deg; Left 90 deg PKB unremarkable B  POSTURE: decreased lumbar lordosis  PALPATION: Point tender over B greater trochanters and B gluteus medius  LUMBAR ROM:   AROM eval  Flexion 75%  Extension 50% P!  Right lateral flexion 50%  Left lateral flexion 50%  Right rotation   Left rotation    (Blank rows = not tested)  LOWER EXTREMITY ROM:   WNL B  Active  Right eval Left eval  Hip flexion    Hip extension    Hip abduction    Hip adduction    Hip internal rotation    Hip external rotation    Knee flexion    Knee extension    Ankle dorsiflexion    Ankle plantarflexion    Ankle inversion    Ankle eversion     (Blank  rows = not tested)  LOWER EXTREMITY MMT:    MMT Right eval Left eval  Hip flexion 4 4  Hip extension 4 4  Hip abduction 4 4  Hip adduction    Hip internal rotation    Hip external rotation    Knee flexion 4 4  Knee extension 4 4  Ankle dorsiflexion    Ankle plantarflexion 4 4  Ankle inversion    Ankle eversion     (Blank rows = not tested)  LUMBAR SPECIAL TESTS:  Straight leg raise test: Negative, Slump test: Negative, and Stork standing: Negative  FUNCTIONAL TESTS:  5 times sit to stand: 9s arms crossed  GAIT: Distance walked: 37ftx2 Assistive device utilized: None Level of assistance: Complete Independence Comments: minimal trunk rotation  TODAY'S TREATMENT:        [facility] Adult PT Treatment:                                                DATE: 06/22/23 Therapeutic Exercise: Nustep L4 8 min Plank on knees 30s x2 P-ball dead bug 15/15 P-ball curl ups 15x Praying mantis 15x Manual Therapy: Skilled palpation to identify taught and irritable bands in R piriformis and B lumbar multifidi Trigger Point Dry Needling Treatment: Pre-treatment instruction: Patient instructed on dry needling rationale, procedures, and possible side effects including pain during treatment (achy,cramping feeling), bruising, drop of blood, lightheadedness, nausea, sweating. Patient Consent Given: Yes Education handout provided: Yes Muscles treated: R piriformis and B lumbar multifidi Needle size and number: .30x63mm x 3 Electrical stimulation performed: No Parameters: N/A Treatment response/outcome: Palpable decrease in muscle tension as well as mild paresthesias reported in region Post-treatment instructions: Patient instructed to expect possible mild to moderate muscle soreness later today and/or tomorrow. Patient instructed in methods to reduce muscle soreness and to continue prescribed HEP. If patient was dry needled over the lung field,  patient was instructed on signs and symptoms of  pneumothorax and, however unlikely, to see immediate medical attention should they occur. Patient was also educated on signs and symptoms of infection and to seek medical attention should they occur. Patient verbalized understanding of these instructions and education.   [facility] Adult PT Treatment:                                                DATE: 06/15/23 Therapeutic Exercise: Nustep L2 8 min QL stertch 30s x2 B SKTC 30s x2 B P-ball curl ups 15x Manual Therapy: Skilled palpation to identify taught and irritable bands in L ilicostalis Trigger Point Dry Needling Treatment: Pre-treatment instruction: Patient instructed on dry needling rationale, procedures, and possible side effects including pain during treatment (achy,cramping feeling), bruising, drop of blood, lightheadedness, nausea, sweating. Patient Consent Given: Yes Education handout provided: Yes Muscles treated: L iliocostalis  Needle size and number: .25x68mm x 1 Electrical stimulation performed: No Parameters: N/A Treatment response/outcome: Palpable decrease in muscle tension as well as mild paresthesias reported in region Post-treatment instructions: Patient instructed to expect possible mild to moderate muscle soreness later today and/or tomorrow. Patient instructed in methods to reduce muscle soreness and to continue prescribed HEP. If patient was dry needled over the lung field, patient was instructed on signs and symptoms of pneumothorax and, however unlikely, to see immediate medical attention should they occur. Patient was also educated on signs and symptoms of infection and to seek medical attention should they occur. Patient verbalized understanding of these instructions and education.    [facility] Adult PT Treatment:                                                DATE: 06/07/23 Therapeutic Exercise: Nustep L4 8 min Prone on elbows 2 min hold Curl up w/p-ball 15x QL stretch 30s x2 Figure 4 bridge 15/15 Dead bug w/p-ball  10/10  Manual Therapy: PA mobs L5-2 Grade II 3x10 per segment  Prone press 10x f/b 10x with PT OP  [facility] Adult PT Treatment:                                                DATE: 05/24/23 Therapeutic Exercise: Nustep L2 8 min Prone prop 2 min QL stretch 30s x2 Piriformis stretch push 30s x2 Figure 4 bridge 15/15 Plank on knees 30s x2 PA mobs L5-2 Grade II 3x10 per segment                                                                                                                   DATE:  05/20/23 Eval    PATIENT EDUCATION:  Education details: Discussed eval findings, rehab rationale and POC and patient is in agreement  Person educated: Patient Education method: Explanation Education comprehension: verbalized understanding and needs further education  HOME EXERCISE PROGRAM: Access Code: [email or web address] URL: [email or web address] Date: 05/23/2023 Prepared by: Gustavus Bryant  Exercises - Sidelying Open Book Thoracic Lumbar Rotation and Extension  - 2 x daily - 5 x weekly - 1 sets - 10 reps - Supine Figure 4 Piriformis Stretch  - 2 x daily - 5 x weekly - 1 sets - 2 reps - 30s hold - Static Prone on Elbows  - 2 x daily - 5 x weekly - 1 sets - 1 reps - 2 min hold - Plank on Knees  - 1 x daily - 5 x weekly - 1 sets - 2 reps - 30s hold  ASSESSMENT:  CLINICAL IMPRESSION: Continued with TPDN which has proved helpful.  Advanced resistance on aerobic work and added more challenging core tasks.  Difficulty maintaining 30s hold on planks and unable to maintain plank on toes.  (Eval)Patient is a 62 y.o. female who was seen today for physical therapy evaluation and treatment for chronic low back pain. Finding show full LE mobility, no nerve tension signs and strength deficits in trunk and BLEs.  Lumbar spring testing finds limited intersegmental mobility and subsequent loss of trunk ROM.  Patient would benefit from OPPT to address spinal stiffness and trunk/LE weakness.   OBJECTIVE  IMPAIRMENTS: Abnormal gait, decreased activity tolerance, decreased balance, decreased knowledge of condition, decreased mobility, difficulty walking, decreased strength, hypomobility, impaired flexibility, improper body mechanics, and pain.   ACTIVITY LIMITATIONS: carrying, lifting, sitting, standing, squatting, and stairs  PERSONAL FACTORS: Age, Fitness, and Time since onset of injury/illness/exacerbation are also affecting patient's functional outcome.   REHAB POTENTIAL: Good  CLINICAL DECISION MAKING: Stable/uncomplicated  EVALUATION COMPLEXITY: Low   GOALS: Goals reviewed with patient? No  SHORT TERM GOALS: Target date: 06/10/2023  Patient to demonstrate independence in HEP  Baseline: [id number] Goal status: INITIAL   LONG TERM GOALS: Target date: 07/01/2023    Decrease pain to 6/10 Baseline: 10/10 Goal status: INITIAL  2.  Increase trunk ROM to 75% Baseline:  AROM eval  Flexion 75%  Extension 50% P!  Right lateral flexion 50%  Left lateral flexion 50%  Right rotation   Left rotation    Goal status: INITIAL  3.  Increase BLE strength to 4+/5 Baseline:  MMT Right eval Left eval  Hip flexion 4 4  Hip extension 4 4  Hip abduction 4 4  Hip adduction    Hip internal rotation    Hip external rotation    Knee flexion 4 4  Knee extension 4 4  Ankle dorsiflexion    Ankle plantarflexion 4 4   Goal status: INITIAL  4.  Obtain FOTO/ODI and determine functional level; 05/24/23 Goal is 10% Baseline: TBD; 05/24/23 13% Goal status: INITIAL    PLAN:  PT FREQUENCY: 1-2x/week  PT DURATION: 6 weeks  PLANNED INTERVENTIONS: Therapeutic exercises, Therapeutic activity, Neuromuscular re-education, Balance training, Gait training, Patient/Family education, Self Care, Joint mobilization, Stair training, Aquatic Therapy, Dry Needling, Electrical stimulation, Spinal mobilization, Cryotherapy, Moist heat, Manual therapy, and Re-evaluation.  PLAN FOR NEXT SESSION: HEP review  and update, manual techniques as appropriate, aerobic tasks, ROM and flexibility activities, strengthening and PREs, TPDN, gait and balance training as needed     Hildred Laser, PT 06/28/2023, 9:55 AM

## 2023-06-29 ENCOUNTER — Ambulatory Visit: Payer: 59

## 2023-06-29 ENCOUNTER — Telehealth: Payer: Self-pay

## 2023-06-29 NOTE — Telephone Encounter (Signed)
TC due to missed visit.  Spoke directly to patient who related she had forgotten appointment.  Reminded of next appointment date and time.

## 2023-07-08 DIAGNOSIS — M542 Cervicalgia: Secondary | ICD-10-CM | POA: Diagnosis not present

## 2023-07-08 DIAGNOSIS — M545 Low back pain, unspecified: Secondary | ICD-10-CM | POA: Diagnosis not present

## 2023-07-08 DIAGNOSIS — M546 Pain in thoracic spine: Secondary | ICD-10-CM | POA: Diagnosis not present

## 2023-07-11 NOTE — Therapy (Unsigned)
OUTPATIENT PHYSICAL THERAPY TREATMENT   Patient Name: [name] MRN: [id number] DOB:07-09-1961, 62 y.o., female Today's Date: 07/13/2023  END OF SESSION:  PT End of Session - 07/13/23 1613     Visit Number 6    Number of Visits 7    Date for PT Re-Evaluation 07/21/23    Authorization Type [employer] MCR    PT Start Time 1615    PT Stop Time 1655    PT Time Calculation (min) 40 min    Activity Tolerance Patient tolerated treatment well    Behavior During Therapy Impulsive;WFL for tasks assessed/performed                  Past Medical History:  Diagnosis Date   Arthritis    Bowel obstruction (HCC)    Bronchitis    CHF (congestive heart failure) (HCC)    Chronic systolic dysfunction of left ventricle    Diabetes mellitus without complication (HCC)    on oral meds   GERD (gastroesophageal reflux disease)    Headache(784.0)    migraines, hasn't had one for over a year   History of noncompliance with medical treatment    Hypercholesteremia    Hypertension    LVH (left ventricular hypertrophy)    Nonischemic cardiomyopathy (HCC)    Polysubstance abuse (HCC)    Tobacco abuse disorder    Past Surgical History:  Procedure Laterality Date   ABDOMINAL HYSTERECTOMY     ABDOMINAL SURGERY     left foot surgery     STRABISMUS SURGERY Left 07/31/2013   Procedure: REPAIR STRABISMUS;  Surgeon: Shara Blazing, MD;  Location: [facility] OR;  Service: Ophthalmology;  Laterality: Left;  EYE MUSCLE SURGERY LEFT EYE   UTERINE FIBROID SURGERY     Patient Active Problem List   Diagnosis Date Noted   CKD (chronic kidney disease) 06/28/2018   Chronic systolic CHF (congestive heart failure) (HCC) 06/28/2018   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 06/28/2018   Nephropathy associated with another disease 06/28/2018   Lumbago 06/28/2018   Vitamin D deficiency, unspecified 06/28/2018   Routine general medical examination at a health care facility 06/28/2018    Dry eye syndrome 06/28/2018   Imbalance 10/04/2017   Diabetes (HCC) 08/09/2016   Left acoustic neuroma (HCC) 10/03/2015   Essential hypertension 03/29/2012   Chronic systolic dysfunction of left ventricle 11/24/2011   ACS (acute coronary syndrome) (HCC) 11/23/2011   CHF, acute (HCC) 11/23/2011   Cocaine abuse (HCC) 11/23/2011   Tobacco abuse 11/23/2011   Pneumonitis, aspiration (HCC) 11/23/2011   Cardiomyopathy (HCC) 11/23/2011   HTN (hypertension), malignant 11/23/2011    PCP: Renaye Rakers, MD   REFERRING PROVIDER: Estanislado Spire PA  REFERRING DIAG: L4 radiculopathy  Rationale for Evaluation and Treatment: Rehabilitation  THERAPY DIAG:  Other low back pain  Other abnormalities of gait and mobility  Muscle weakness (generalized)  ONSET DATE: chronic  SUBJECTIVE:  SUBJECTIVE STATEMENT: Larey Seat 3 weeks ago and had an exacerbation of symptoms.  Denies radicular symptoms.  Saw ortho and was offered a tramadol injection(?) which helped but did not resolve symptoms   PERTINENT HISTORY:  None available  PAIN:  Are you having pain? Yes: NPRS scale: 10/10 Pain location: low back  Pain description: ache Aggravating factors: twisting, sudden movements Relieving factors: position changes  PRECAUTIONS: None  RED FLAGS: None   WEIGHT BEARING RESTRICTIONS: No  FALLS:  Has patient fallen in last 6 months? Yes. Number of falls 6+  OCCUPATION: part time house keeping  PLOF: Independent  PATIENT GOALS: To reduce and manage my back pain  NEXT MD VISIT: as needed  OBJECTIVE:   DIAGNOSTIC FINDINGS:  None recent  PATIENT SURVEYS:  ODI TBD; 05/24/23 13     MUSCLE LENGTH: Hamstrings: Right 90 deg; Left 90 deg PKB unremarkable B  POSTURE: decreased lumbar lordosis  PALPATION: Point  tender over B greater trochanters and B gluteus medius  LUMBAR ROM:   AROM eval 07/13/23  Flexion 75% 75%  Extension 50% P! 50%  Right lateral flexion 50% 50%  Left lateral flexion 50% 50%  Right rotation  75%  Left rotation  75%   (Blank rows = not tested)  LOWER EXTREMITY ROM:   WNL B  Active  Right eval Left eval  Hip flexion    Hip extension    Hip abduction    Hip adduction    Hip internal rotation    Hip external rotation    Knee flexion    Knee extension    Ankle dorsiflexion    Ankle plantarflexion    Ankle inversion    Ankle eversion     (Blank rows = not tested)  LOWER EXTREMITY MMT:    MMT Right eval Left eval  Hip flexion 4 4  Hip extension 4 4  Hip abduction 4 4  Hip adduction    Hip internal rotation    Hip external rotation    Knee flexion 4 4  Knee extension 4 4  Ankle dorsiflexion    Ankle plantarflexion 4 4  Ankle inversion    Ankle eversion     (Blank rows = not tested)  LUMBAR SPECIAL TESTS:  Straight leg raise test: Negative, Slump test: Negative, and Stork standing: Negative 07/13/23 Slump negative B  FUNCTIONAL TESTS:  5 times sit to stand: 9s arms crossed  GAIT: Distance walked: 72ftx2 Assistive device utilized: None Level of assistance: Complete Independence Comments: minimal trunk rotation  TODAY'S TREATMENT:        [facility] Adult PT Treatment:                                                DATE: 07/13/23 Re-assssment Manual Therapy: Skilled palpation to identify taught and irritable bands in R piriformis and B lumbar multifidi Trigger Point Dry Needling Treatment: Pre-treatment instruction: Patient instructed on dry needling rationale, procedures, and possible side effects including pain during treatment (achy,cramping feeling), bruising, drop of blood, lightheadedness, nausea, sweating. Patient Consent Given: Yes Education handout provided: Yes Muscles treated:  B lumbar multifidi Needle size and number: .30x60mm x  2 Electrical stimulation performed: No Parameters: N/A Treatment response/outcome: Palpable decrease in muscle tension as well as mild paresthesias reported in region Post-treatment instructions: Patient instructed to expect possible mild to moderate muscle soreness  later today and/or tomorrow. Patient instructed in methods to reduce muscle soreness and to continue prescribed HEP. If patient was dry needled over the lung field, patient was instructed on signs and symptoms of pneumothorax and, however unlikely, to see immediate medical attention should they occur. Patient was also educated on signs and symptoms of infection and to seek medical attention should they occur. Patient verbalized understanding of these instructions and education.  Therapeutic Exercise:  QL stretch 30s B  R piriformis stretch 30s x2  R piriformis release 5 min    [facility] Adult PT Treatment:                                                DATE: 06/22/23 Therapeutic Exercise: Nustep L4 8 min Plank on knees 30s x2 P-ball dead bug 15/15 P-ball curl ups 15x Praying mantis 15x Manual Therapy: Skilled palpation to identify taught and irritable bands in R piriformis and B lumbar multifidi Trigger Point Dry Needling Treatment: Pre-treatment instruction: Patient instructed on dry needling rationale, procedures, and possible side effects including pain during treatment (achy,cramping feeling), bruising, drop of blood, lightheadedness, nausea, sweating. Patient Consent Given: Yes Education handout provided: Yes Muscles treated: R piriformis and B lumbar multifidi Needle size and number: .30x5mm x 3 Electrical stimulation performed: No Parameters: N/A Treatment response/outcome: Palpable decrease in muscle tension as well as mild paresthesias reported in region Post-treatment instructions: Patient instructed to expect possible mild to moderate muscle soreness later today and/or tomorrow. Patient instructed in methods to reduce  muscle soreness and to continue prescribed HEP. If patient was dry needled over the lung field, patient was instructed on signs and symptoms of pneumothorax and, however unlikely, to see immediate medical attention should they occur. Patient was also educated on signs and symptoms of infection and to seek medical attention should they occur. Patient verbalized understanding of these instructions and education.   [facility] Adult PT Treatment:                                                DATE: 06/15/23 Therapeutic Exercise: Nustep L2 8 min QL stertch 30s x2 B SKTC 30s x2 B P-ball curl ups 15x Manual Therapy: Skilled palpation to identify taught and irritable bands in L ilicostalis Trigger Point Dry Needling Treatment: Pre-treatment instruction: Patient instructed on dry needling rationale, procedures, and possible side effects including pain during treatment (achy,cramping feeling), bruising, drop of blood, lightheadedness, nausea, sweating. Patient Consent Given: Yes Education handout provided: Yes Muscles treated: L iliocostalis  Needle size and number: .25x36mm x 1 Electrical stimulation performed: No Parameters: N/A Treatment response/outcome: Palpable decrease in muscle tension as well as mild paresthesias reported in region Post-treatment instructions: Patient instructed to expect possible mild to moderate muscle soreness later today and/or tomorrow. Patient instructed in methods to reduce muscle soreness and to continue prescribed HEP. If patient was dry needled over the lung field, patient was instructed on signs and symptoms of pneumothorax and, however unlikely, to see immediate medical attention should they occur. Patient was also educated on signs and symptoms of infection and to seek medical attention should they occur. Patient verbalized understanding of these instructions and education.    [facility] Adult PT Treatment:  DATE: 06/07/23 Therapeutic  Exercise: Nustep L4 8 min Prone on elbows 2 min hold Curl up w/p-ball 15x QL stretch 30s x2 Figure 4 bridge 15/15 Dead bug w/p-ball 10/10  Manual Therapy: PA mobs L5-2 Grade II 3x10 per segment  Prone press 10x f/b 10x with PT OP  [facility] Adult PT Treatment:                                                DATE: 05/24/23 Therapeutic Exercise: Nustep L2 8 min Prone prop 2 min QL stretch 30s x2 Piriformis stretch push 30s x2 Figure 4 bridge 15/15 Plank on knees 30s x2 PA mobs L5-2 Grade II 3x10 per segment                                                                                                                   DATE: 05/20/23 Eval    PATIENT EDUCATION:  Education details: Discussed eval findings, rehab rationale and POC and patient is in agreement  Person educated: Patient Education method: Explanation Education comprehension: verbalized understanding and needs further education  HOME EXERCISE PROGRAM: Access Code: [email or web address] URL: [email or web address] Date: 05/23/2023 Prepared by: Gustavus Bryant  Exercises - Sidelying Open Book Thoracic Lumbar Rotation and Extension  - 2 x daily - 5 x weekly - 1 sets - 10 reps - Supine Figure 4 Piriformis Stretch  - 2 x daily - 5 x weekly - 1 sets - 2 reps - 30s hold - Static Prone on Elbows  - 2 x daily - 5 x weekly - 1 sets - 1 reps - 2 min hold - Plank on Knees  - 1 x daily - 5 x weekly - 1 sets - 2 reps - 30s hold  ASSESSMENT:  CLINICAL IMPRESSION: Re-assessed symptoms following recent fall /trauma.  Finding sshow exacerbation of muscle spasm and soft tissue guarding throughout lumbosacral region, no radicular symptoms.  (Eval)Patient is a 62 y.o. female who was seen today for physical therapy evaluation and treatment for chronic low back pain. Finding show full LE mobility, no nerve tension signs and strength deficits in trunk and BLEs.  Lumbar spring testing finds limited intersegmental mobility and subsequent loss of  trunk ROM.  Patient would benefit from OPPT to address spinal stiffness and trunk/LE weakness.   OBJECTIVE IMPAIRMENTS: Abnormal gait, decreased activity tolerance, decreased balance, decreased knowledge of condition, decreased mobility, difficulty walking, decreased strength, hypomobility, impaired flexibility, improper body mechanics, and pain.   ACTIVITY LIMITATIONS: carrying, lifting, sitting, standing, squatting, and stairs  PERSONAL FACTORS: Age, Fitness, and Time since onset of injury/illness/exacerbation are also affecting patient's functional outcome.   REHAB POTENTIAL: Good  CLINICAL DECISION MAKING: Stable/uncomplicated  EVALUATION COMPLEXITY: Low   GOALS: Goals reviewed with patient? No  SHORT TERM GOALS: Target date: 06/10/2023  Patient to demonstrate independence in HEP  Baseline: [id number] Goal status: Met   LONG  TERM GOALS: Target date: 07/01/2023    Decrease pain to 6/10 Baseline: 10/10 Goal status: INITIAL  2.  Increase trunk ROM to 75% Baseline:  AROM eval  Flexion 75%  Extension 50% P!  Right lateral flexion 50%  Left lateral flexion 50%  Right rotation   Left rotation    Goal status: INITIAL  3.  Increase BLE strength to 4+/5 Baseline:  MMT Right eval Left eval  Hip flexion 4 4  Hip extension 4 4  Hip abduction 4 4  Hip adduction    Hip internal rotation    Hip external rotation    Knee flexion 4 4  Knee extension 4 4  Ankle dorsiflexion    Ankle plantarflexion 4 4   Goal status: INITIAL  4.  Obtain FOTO/ODI and determine functional level; 05/24/23 Goal is 10% Baseline: TBD; 05/24/23 13% Goal status: INITIAL    PLAN:  PT FREQUENCY: 1-2x/week  PT DURATION: 6 weeks  PLANNED INTERVENTIONS: Therapeutic exercises, Therapeutic activity, Neuromuscular re-education, Balance training, Gait training, Patient/Family education, Self Care, Joint mobilization, Stair training, Aquatic Therapy, Dry Needling, Electrical stimulation, Spinal  mobilization, Cryotherapy, Moist heat, Manual therapy, and Re-evaluation.  PLAN FOR NEXT SESSION: HEP review and update, manual techniques as appropriate, aerobic tasks, ROM and flexibility activities, strengthening and PREs, TPDN, gait and balance training as needed     Hildred Laser, PT 07/13/2023, 5:01 PM

## 2023-07-13 ENCOUNTER — Ambulatory Visit: Payer: 59 | Attending: Family Medicine

## 2023-07-13 DIAGNOSIS — M5459 Other low back pain: Secondary | ICD-10-CM | POA: Insufficient documentation

## 2023-07-13 DIAGNOSIS — M6281 Muscle weakness (generalized): Secondary | ICD-10-CM | POA: Diagnosis not present

## 2023-07-13 DIAGNOSIS — R2689 Other abnormalities of gait and mobility: Secondary | ICD-10-CM | POA: Diagnosis not present

## 2023-07-19 NOTE — Therapy (Unsigned)
OUTPATIENT PHYSICAL THERAPY TREATMENT   Patient Name: [name] MRN: [id number] DOB:[birth year], 62 y.o., female Today's Date: 07/20/2023  END OF SESSION:  PT End of Session - 07/20/23 1005     Visit Number 7    Number of Visits 13    Date for PT Re-Evaluation 07/21/23    Authorization Type [employer] MCR    PT Start Time 1000    PT Stop Time 1040    PT Time Calculation (min) 40 min    Activity Tolerance Patient tolerated treatment well    Behavior During Therapy Impulsive;WFL for tasks assessed/performed                   Past Medical History:  Diagnosis Date   Arthritis    Bowel obstruction (HCC)    Bronchitis    CHF (congestive heart failure) (HCC)    Chronic systolic dysfunction of left ventricle    Diabetes mellitus without complication (HCC)    on oral meds   GERD (gastroesophageal reflux disease)    Headache(784.0)    migraines, hasn't had one for over a year   History of noncompliance with medical treatment    Hypercholesteremia    Hypertension    LVH (left ventricular hypertrophy)    Nonischemic cardiomyopathy (HCC)    Polysubstance abuse (HCC)    Tobacco abuse disorder    Past Surgical History:  Procedure Laterality Date   ABDOMINAL HYSTERECTOMY     ABDOMINAL SURGERY     left foot surgery     STRABISMUS SURGERY Left 07/31/2013   Procedure: REPAIR STRABISMUS;  Surgeon: Shara Blazing, MD;  Location: [facility] OR;  Service: Ophthalmology;  Laterality: Left;  EYE MUSCLE SURGERY LEFT EYE   UTERINE FIBROID SURGERY     Patient Active Problem List   Diagnosis Date Noted   CKD (chronic kidney disease) 06/28/2018   Chronic systolic CHF (congestive heart failure) (HCC) 06/28/2018   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 06/28/2018   Nephropathy associated with another disease 06/28/2018   Lumbago 06/28/2018   Vitamin D deficiency, unspecified 06/28/2018   Routine general medical examination at a health care facility 06/28/2018    Dry eye syndrome 06/28/2018   Imbalance 10/04/2017   Diabetes (HCC) 08/09/2016   Left acoustic neuroma (HCC) 10/03/2015   Essential hypertension 03/29/2012   Chronic systolic dysfunction of left ventricle 11/24/2011   ACS (acute coronary syndrome) (HCC) 11/23/2011   CHF, acute (HCC) 11/23/2011   Cocaine abuse (HCC) 11/23/2011   Tobacco abuse 11/23/2011   Pneumonitis, aspiration (HCC) 11/23/2011   Cardiomyopathy (HCC) 11/23/2011   HTN (hypertension), malignant 11/23/2011    PCP: Renaye Rakers, MD   REFERRING PROVIDER: Estanislado Spire PA  REFERRING DIAG: L4 radiculopathy  Rationale for Evaluation and Treatment: Rehabilitation  THERAPY DIAG:  Other low back pain  Other abnormalities of gait and mobility  Muscle weakness (generalized)  ONSET DATE: chronic  SUBJECTIVE:  SUBJECTIVE STATEMENT: Sore following last session but symptoms began to ease off after 48 hrs.     PERTINENT HISTORY:  None available  PAIN:  Are you having pain? Yes: NPRS scale: 10/10 Pain location: low back  Pain description: ache Aggravating factors: twisting, sudden movements Relieving factors: position changes  PRECAUTIONS: None  RED FLAGS: None   WEIGHT BEARING RESTRICTIONS: No  FALLS:  Has patient fallen in last 6 months? Yes. Number of falls 6+  OCCUPATION: part time house keeping  PLOF: Independent  PATIENT GOALS: To reduce and manage my back pain  NEXT MD VISIT: as needed  OBJECTIVE:   DIAGNOSTIC FINDINGS:  None recent  PATIENT SURVEYS:  ODI TBD; 05/24/23 13     MUSCLE LENGTH: Hamstrings: Right 90 deg; Left 90 deg PKB unremarkable B  POSTURE: decreased lumbar lordosis  PALPATION: Point tender over B greater trochanters and B gluteus medius  LUMBAR ROM:   AROM eval 07/13/23  Flexion  75% 75%  Extension 50% P! 50%  Right lateral flexion 50% 50%  Left lateral flexion 50% 50%  Right rotation  75%  Left rotation  75%   (Blank rows = not tested)  LOWER EXTREMITY ROM:   WNL B  Active  Right eval Left eval  Hip flexion    Hip extension    Hip abduction    Hip adduction    Hip internal rotation    Hip external rotation    Knee flexion    Knee extension    Ankle dorsiflexion    Ankle plantarflexion    Ankle inversion    Ankle eversion     (Blank rows = not tested)  LOWER EXTREMITY MMT:    MMT Right eval Left eval  Hip flexion 4 4  Hip extension 4 4  Hip abduction 4 4  Hip adduction    Hip internal rotation    Hip external rotation    Knee flexion 4 4  Knee extension 4 4  Ankle dorsiflexion    Ankle plantarflexion 4 4  Ankle inversion    Ankle eversion     (Blank rows = not tested)  LUMBAR SPECIAL TESTS:  Straight leg raise test: Negative, Slump test: Negative, and Stork standing: Negative 07/13/23 Slump negative B  FUNCTIONAL TESTS:  5 times sit to stand: 9s arms crossed  GAIT: Distance walked: 14ftx2 Assistive device utilized: None Level of assistance: Complete Independence Comments: minimal trunk rotation  TODAY'S TREATMENT:     [facility] Adult PT Treatment:                                                DATE: 07/20/23 Therapeutic Exercise: Nustep L4  QL stertch 30s x2 B Curl ups with p-ball 15x B, 15/15 unilaterally POE 2 min Manual Therapy: Skilled palpation to identify taught and irritable bands in B lumbar multifidi  Trigger Point Dry Needling Treatment: Pre-treatment instruction: Patient instructed on dry needling rationale, procedures, and possible side effects including pain during treatment (achy,cramping feeling), bruising, drop of blood, lightheadedness, nausea, sweating. Patient Consent Given: Yes Education handout provided: Previously provided Muscles treated: B lumbar multifidi   Needle size and number: .30x58mm x  2 Electrical stimulation performed: No Parameters: N/A Treatment response/outcome: Twitch response elicited, Palpable decrease in muscle tension, and less discomfort Post-treatment instructions: Patient instructed to expect possible mild to moderate muscle soreness  later today and/or tomorrow. Patient instructed in methods to reduce muscle soreness and to continue prescribed HEP. If patient was dry needled over the lung field, patient was instructed on signs and symptoms of pneumothorax and, however unlikely, to see immediate medical attention should they occur. Patient was also educated on signs and symptoms of infection and to seek medical attention should they occur. Patient verbalized understanding of these instructions and education.     [facility] Adult PT Treatment:                                                DATE: 07/13/23 Re-assssment Manual Therapy: Skilled palpation to identify taught and irritable bands in R piriformis and B lumbar multifidi Trigger Point Dry Needling Treatment: Pre-treatment instruction: Patient instructed on dry needling rationale, procedures, and possible side effects including pain during treatment (achy,cramping feeling), bruising, drop of blood, lightheadedness, nausea, sweating. Patient Consent Given: Yes Education handout provided: Yes Muscles treated:  B lumbar multifidi Needle size and number: .30x107mm x 2 Electrical stimulation performed: No Parameters: N/A Treatment response/outcome: Palpable decrease in muscle tension as well as mild paresthesias reported in region Post-treatment instructions: Patient instructed to expect possible mild to moderate muscle soreness later today and/or tomorrow. Patient instructed in methods to reduce muscle soreness and to continue prescribed HEP. If patient was dry needled over the lung field, patient was instructed on signs and symptoms of pneumothorax and, however unlikely, to see immediate medical attention should they  occur. Patient was also educated on signs and symptoms of infection and to seek medical attention should they occur. Patient verbalized understanding of these instructions and education.  Therapeutic Exercise:  QL stretch 30s B  R piriformis stretch 30s x2  R piriformis release 5 min    [facility] Adult PT Treatment:                                                DATE: 06/22/23 Therapeutic Exercise: Nustep L4 8 min Plank on knees 30s x2 P-ball dead bug 15/15 P-ball curl ups 15x Praying mantis 15x Manual Therapy: Skilled palpation to identify taught and irritable bands in R piriformis and B lumbar multifidi Trigger Point Dry Needling Treatment: Pre-treatment instruction: Patient instructed on dry needling rationale, procedures, and possible side effects including pain during treatment (achy,cramping feeling), bruising, drop of blood, lightheadedness, nausea, sweating. Patient Consent Given: Yes Education handout provided: Yes Muscles treated: R piriformis and B lumbar multifidi Needle size and number: .30x4mm x 3 Electrical stimulation performed: No Parameters: N/A Treatment response/outcome: Palpable decrease in muscle tension as well as mild paresthesias reported in region Post-treatment instructions: Patient instructed to expect possible mild to moderate muscle soreness later today and/or tomorrow. Patient instructed in methods to reduce muscle soreness and to continue prescribed HEP. If patient was dry needled over the lung field, patient was instructed on signs and symptoms of pneumothorax and, however unlikely, to see immediate medical attention should they occur. Patient was also educated on signs and symptoms of infection and to seek medical attention should they occur. Patient verbalized understanding of these instructions and education.   [facility] Adult PT Treatment:  DATE: 06/15/23 Therapeutic Exercise: Nustep L2 8 min QL stertch 30s x2  B SKTC 30s x2 B P-ball curl ups 15x Manual Therapy: Skilled palpation to identify taught and irritable bands in L ilicostalis Trigger Point Dry Needling Treatment: Pre-treatment instruction: Patient instructed on dry needling rationale, procedures, and possible side effects including pain during treatment (achy,cramping feeling), bruising, drop of blood, lightheadedness, nausea, sweating. Patient Consent Given: Yes Education handout provided: Yes Muscles treated: L iliocostalis  Needle size and number: .25x48mm x 1 Electrical stimulation performed: No Parameters: N/A Treatment response/outcome: Palpable decrease in muscle tension as well as mild paresthesias reported in region Post-treatment instructions: Patient instructed to expect possible mild to moderate muscle soreness later today and/or tomorrow. Patient instructed in methods to reduce muscle soreness and to continue prescribed HEP. If patient was dry needled over the lung field, patient was instructed on signs and symptoms of pneumothorax and, however unlikely, to see immediate medical attention should they occur. Patient was also educated on signs and symptoms of infection and to seek medical attention should they occur. Patient verbalized understanding of these instructions and education.    [facility] Adult PT Treatment:                                                DATE: 06/07/23 Therapeutic Exercise: Nustep L4 8 min Prone on elbows 2 min hold Curl up w/p-ball 15x QL stretch 30s x2 Figure 4 bridge 15/15 Dead bug w/p-ball 10/10  Manual Therapy: PA mobs L5-2 Grade II 3x10 per segment  Prone press 10x f/b 10x with PT OP  [facility] Adult PT Treatment:                                                DATE: 05/24/23 Therapeutic Exercise: Nustep L2 8 min Prone prop 2 min QL stretch 30s x2 Piriformis stretch push 30s x2 Figure 4 bridge 15/15 Plank on knees 30s x2 PA mobs L5-2 Grade II 3x10 per segment                                                                                                                    DATE: 05/20/23 Eval    PATIENT EDUCATION:  Education details: Discussed eval findings, rehab rationale and POC and patient is in agreement  Person educated: Patient Education method: Explanation Education comprehension: verbalized understanding and needs further education  HOME EXERCISE PROGRAM: Access Code: [email or web address] URL: [email or web address] Date: 05/23/2023 Prepared by: Gustavus Bryant  Exercises - Sidelying Open Book Thoracic Lumbar Rotation and Extension  - 2 x daily - 5 x weekly - 1 sets - 10 reps - Supine Figure 4 Piriformis Stretch  - 2 x daily - 5 x weekly - 1 sets - 2 reps -  30s hold - Static Prone on Elbows  - 2 x daily - 5 x weekly - 1 sets - 1 reps - 2 min hold - Plank on Knees  - 1 x daily - 5 x weekly - 1 sets - 2 reps - 30s hold  ASSESSMENT:  CLINICAL IMPRESSION: Focus of today was continued TPDN to lumbosacral region as patient showing a good response.  Followed up with stretching tasks and abdominal work to promote spinal decompression.  (Eval)Patient is a 62 y.o. female who was seen today for physical therapy evaluation and treatment for chronic low back pain. Finding show full LE mobility, no nerve tension signs and strength deficits in trunk and BLEs.  Lumbar spring testing finds limited intersegmental mobility and subsequent loss of trunk ROM.  Patient would benefit from OPPT to address spinal stiffness and trunk/LE weakness.   OBJECTIVE IMPAIRMENTS: Abnormal gait, decreased activity tolerance, decreased balance, decreased knowledge of condition, decreased mobility, difficulty walking, decreased strength, hypomobility, impaired flexibility, improper body mechanics, and pain.   ACTIVITY LIMITATIONS: carrying, lifting, sitting, standing, squatting, and stairs  PERSONAL FACTORS: Age, Fitness, and Time since onset of injury/illness/exacerbation are also affecting patient's  functional outcome.   REHAB POTENTIAL: Good  CLINICAL DECISION MAKING: Stable/uncomplicated  EVALUATION COMPLEXITY: Low   GOALS: Goals reviewed with patient? No  SHORT TERM GOALS: Target date: 06/10/2023  Patient to demonstrate independence in HEP  Baseline: [id number] Goal status: Met   LONG TERM GOALS: Target date: 07/01/2023    Decrease pain to 6/10 Baseline: 10/10 Goal status: INITIAL  2.  Increase trunk ROM to 75% Baseline:  AROM eval  Flexion 75%  Extension 50% P!  Right lateral flexion 50%  Left lateral flexion 50%  Right rotation   Left rotation    Goal status: INITIAL  3.  Increase BLE strength to 4+/5 Baseline:  MMT Right eval Left eval  Hip flexion 4 4  Hip extension 4 4  Hip abduction 4 4  Hip adduction    Hip internal rotation    Hip external rotation    Knee flexion 4 4  Knee extension 4 4  Ankle dorsiflexion    Ankle plantarflexion 4 4   Goal status: INITIAL  4.  Obtain FOTO/ODI and determine functional level; 05/24/23 Goal is 10% Baseline: TBD; 05/24/23 13% Goal status: INITIAL    PLAN:  PT FREQUENCY: 1-2x/week  PT DURATION: 6 weeks  PLANNED INTERVENTIONS: Therapeutic exercises, Therapeutic activity, Neuromuscular re-education, Balance training, Gait training, Patient/Family education, Self Care, Joint mobilization, Stair training, Aquatic Therapy, Dry Needling, Electrical stimulation, Spinal mobilization, Cryotherapy, Moist heat, Manual therapy, and Re-evaluation.  PLAN FOR NEXT SESSION: HEP review and update, manual techniques as appropriate, aerobic tasks, ROM and flexibility activities, strengthening and PREs, TPDN, gait and balance training as needed     Hildred Laser, PT 07/20/2023, 10:47 AM

## 2023-07-20 ENCOUNTER — Ambulatory Visit: Payer: 59

## 2023-07-20 ENCOUNTER — Ambulatory Visit
Admission: RE | Admit: 2023-07-20 | Discharge: 2023-07-20 | Disposition: A | Discharge status: Home / Self Care | Payer: 59 | Source: Ambulatory Visit

## 2023-07-20 ENCOUNTER — Ambulatory Visit
Admission: RE | Admit: 2023-07-20 | Discharge: 2023-07-20 | Disposition: A | Discharge status: Home / Self Care | Payer: 59 | Source: Ambulatory Visit | Attending: Family Medicine | Admitting: Family Medicine

## 2023-07-20 DIAGNOSIS — N644 Mastodynia: Secondary | ICD-10-CM

## 2023-07-20 DIAGNOSIS — M6281 Muscle weakness (generalized): Secondary | ICD-10-CM

## 2023-07-20 DIAGNOSIS — M5459 Other low back pain: Secondary | ICD-10-CM

## 2023-07-20 DIAGNOSIS — R2689 Other abnormalities of gait and mobility: Secondary | ICD-10-CM

## 2023-07-22 ENCOUNTER — Telehealth: Payer: Self-pay

## 2023-07-22 NOTE — Telephone Encounter (Signed)
TC to reschedule 10/3 visit time, VM left.

## 2023-07-28 DIAGNOSIS — J399 Disease of upper respiratory tract, unspecified: Secondary | ICD-10-CM | POA: Diagnosis not present

## 2023-07-28 DIAGNOSIS — R296 Repeated falls: Secondary | ICD-10-CM | POA: Diagnosis not present

## 2023-07-28 DIAGNOSIS — I10 Essential (primary) hypertension: Secondary | ICD-10-CM | POA: Diagnosis not present

## 2023-08-03 ENCOUNTER — Ambulatory Visit: Payer: 59

## 2023-08-03 ENCOUNTER — Other Ambulatory Visit: Payer: Self-pay

## 2023-08-03 ENCOUNTER — Ambulatory Visit: Payer: 59 | Attending: Family Medicine

## 2023-08-03 DIAGNOSIS — R2689 Other abnormalities of gait and mobility: Secondary | ICD-10-CM | POA: Diagnosis not present

## 2023-08-03 DIAGNOSIS — M6281 Muscle weakness (generalized): Secondary | ICD-10-CM

## 2023-08-03 DIAGNOSIS — M542 Cervicalgia: Secondary | ICD-10-CM | POA: Insufficient documentation

## 2023-08-03 DIAGNOSIS — M5459 Other low back pain: Secondary | ICD-10-CM

## 2023-08-03 NOTE — Therapy (Signed)
OUTPATIENT PHYSICAL THERAPY CERVICAL EVALUATION   Patient Name: [name] MRN: [id number] DOB:03/26/1961, 62 y.o., female Today's Date: 08/03/2023  END OF SESSION:  PT End of Session - 08/03/23 1455     Visit Number 8    Number of Visits 13    Date for PT Re-Evaluation 10/03/23    Authorization Type [employer] MCR    PT Start Time 1450    PT Stop Time 1530    PT Time Calculation (min) 40 min    Activity Tolerance Patient tolerated treatment well    Behavior During Therapy Impulsive;WFL for tasks assessed/performed            Past Medical History:  Diagnosis Date   Arthritis    Bowel obstruction (HCC)    Bronchitis    CHF (congestive heart failure) (HCC)    Chronic systolic dysfunction of left ventricle    Diabetes mellitus without complication (HCC)    on oral meds   GERD (gastroesophageal reflux disease)    Headache(784.0)    migraines, hasn't had one for over a year   History of noncompliance with medical treatment    Hypercholesteremia    Hypertension    LVH (left ventricular hypertrophy)    Nonischemic cardiomyopathy (HCC)    Polysubstance abuse (HCC)    Tobacco abuse disorder    Past Surgical History:  Procedure Laterality Date   ABDOMINAL HYSTERECTOMY     ABDOMINAL SURGERY     left foot surgery     STRABISMUS SURGERY Left 07/31/2013   Procedure: REPAIR STRABISMUS;  Surgeon: Shara Blazing, MD;  Location: [facility] OR;  Service: Ophthalmology;  Laterality: Left;  EYE MUSCLE SURGERY LEFT EYE   UTERINE FIBROID SURGERY     Patient Active Problem List   Diagnosis Date Noted   CKD (chronic kidney disease) 06/28/2018   Chronic systolic CHF (congestive heart failure) (HCC) 06/28/2018   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 06/28/2018   Nephropathy associated with another disease 06/28/2018   Lumbago 06/28/2018   Vitamin D deficiency, unspecified 06/28/2018   Routine general medical examination at a health care facility 06/28/2018   Dry  eye syndrome 06/28/2018   Imbalance 10/04/2017   Diabetes (HCC) 08/09/2016   Left acoustic neuroma (HCC) 10/03/2015   Essential hypertension 03/29/2012   Chronic systolic dysfunction of left ventricle 11/24/2011   ACS (acute coronary syndrome) (HCC) 11/23/2011   CHF, acute (HCC) 11/23/2011   Cocaine abuse (HCC) 11/23/2011   Tobacco abuse 11/23/2011   Pneumonitis, aspiration (HCC) 11/23/2011   Cardiomyopathy (HCC) 11/23/2011   HTN (hypertension), malignant 11/23/2011    PCP: Renaye Rakers, MD   REFERRING PROVIDER: Estanislado Spire PA  REFERRING DIAG: L4 radiculopathy, cervicalgia  Rationale for Evaluation and Treatment: Rehabilitation  THERAPY DIAG:  Other low back pain  Other abnormalities of gait and mobility  Muscle weakness (generalized)  Cervicalgia  ONSET DATE: chronic  SUBJECTIVE:  SUBJECTIVE STATEMENT: Describes a history of chronic neck pain exacerbated by a recent fall 6-8 weeks ago.  Denies radicular symptoms and has concurrent B shoulder pathologies which she has been offered surgery for but has declined to pursue.  PERTINENT HISTORY:  None available for cervical region  PAIN:  Are you having pain? Yes: NPRS scale: 10/10 Pain location: low back  Pain description: ache Aggravating factors: twisting, sudden movements Relieving factors: position changes  Are you having pain? Yes: NPRS scale: 10/10 Pain location: neck and upper back Pain description: ache Aggravating factors: activity and prolonged positioning Relieving factors: position changes and rest  PRECAUTIONS: None  RED FLAGS: None   WEIGHT BEARING RESTRICTIONS: No  FALLS:  Has patient fallen in last 6 months? Yes. Number of falls 6+  OCCUPATION: part time house keeping  PLOF: Independent  PATIENT GOALS: To  reduce and manage my back pain  NEXT MD VISIT: as needed  OBJECTIVE:   DIAGNOSTIC FINDINGS:  None recent  PATIENT SURVEYS:  ODI TBD; 05/24/23 13  NDI 08/03/23 23/50 46% perceived disability    MUSCLE LENGTH: Hamstrings: Right 90 deg; Left 90 deg PKB unremarkable B  POSTURE: decreased lumbar lordosis Rounded and elevated shoulders  PALPATION: Point tender over B greater trochanters and B gluteus medius 08/03/23 TTP of B UT, levators and scalenes  LUMBAR ROM:   AROM eval 07/13/23  Flexion 75% 75%  Extension 50% P! 50%  Right lateral flexion 50% 50%  Left lateral flexion 50% 50%  Right rotation  75%  Left rotation  75%   (Blank rows = not tested)  LOWER EXTREMITY ROM:   WNL B  Active  Right eval Left eval  Hip flexion    Hip extension    Hip abduction    Hip adduction    Hip internal rotation    Hip external rotation    Knee flexion    Knee extension    Ankle dorsiflexion    Ankle plantarflexion    Ankle inversion    Ankle eversion     (Blank rows = not tested)  LOWER EXTREMITY MMT:    MMT Right eval Left eval  Hip flexion 4 4  Hip extension 4 4  Hip abduction 4 4  Hip adduction    Hip internal rotation    Hip external rotation    Knee flexion 4 4  Knee extension 4 4  Ankle dorsiflexion    Ankle plantarflexion 4 4  Ankle inversion    Ankle eversion     (Blank rows = not tested) CERVICAL ROM:   Active ROM A/PROM (deg) eval  Flexion 90%  Extension 90%  Right lateral flexion 75%  Left lateral flexion 50%  Right rotation 75%  Left rotation 50%   UPPER EXTREMITY MMT:  MMT Right eval Left eval  Shoulder flexion 3+ 4  Shoulder extension 4 4  Shoulder abduction 3+ 4  Shoulder adduction    Shoulder extension    Shoulder internal rotation 4 4  Shoulder external rotation 3+ 4  Middle trapezius    Lower trapezius    Elbow flexion    Elbow extension    Wrist flexion    Wrist extension    Wrist ulnar deviation    Wrist radial  deviation    Wrist pronation    Wrist supination    Grip strength     UPPER EXTREMITY ROM: WFL B   Active ROM Right eval Left eval  Shoulder flexion    Shoulder  extension    Shoulder abduction    Shoulder adduction    Shoulder extension    Shoulder internal rotation    Shoulder external rotation    Elbow flexion    Elbow extension    Wrist flexion    Wrist extension    Wrist ulnar deviation    Wrist radial deviation    Wrist pronation    Wrist supination     (Blank rows = not tested)  LUMBAR SPECIAL TESTS:  Straight leg raise test: Negative, Slump test: Negative, and Stork standing: Negative 07/13/23 Slump negative B  FUNCTIONAL TESTS:  5 times sit to stand: 9s arms crossed  GAIT: Distance walked: 18ftx2 Assistive device utilized: None Level of assistance: Complete Independence Comments: minimal trunk rotation  TODAY'S TREATMENT:     08/03/23: Eval cervical spine  [facility] Adult PT Treatment:                                                DATE: 07/20/23 Therapeutic Exercise: Nustep L4  QL stertch 30s x2 B Curl ups with p-ball 15x B, 15/15 unilaterally POE 2 min Manual Therapy: Skilled palpation to identify taught and irritable bands in B lumbar multifidi  Trigger Point Dry Needling Treatment: Pre-treatment instruction: Patient instructed on dry needling rationale, procedures, and possible side effects including pain during treatment (achy,cramping feeling), bruising, drop of blood, lightheadedness, nausea, sweating. Patient Consent Given: Yes Education handout provided: Previously provided Muscles treated: B lumbar multifidi   Needle size and number: .30x78mm x 2 Electrical stimulation performed: No Parameters: N/A Treatment response/outcome: Twitch response elicited, Palpable decrease in muscle tension, and less discomfort Post-treatment instructions: Patient instructed to expect possible mild to moderate muscle soreness later today and/or tomorrow. Patient  instructed in methods to reduce muscle soreness and to continue prescribed HEP. If patient was dry needled over the lung field, patient was instructed on signs and symptoms of pneumothorax and, however unlikely, to see immediate medical attention should they occur. Patient was also educated on signs and symptoms of infection and to seek medical attention should they occur. Patient verbalized understanding of these instructions and education.     [facility] Adult PT Treatment:                                                DATE: 07/13/23 Re-assssment Manual Therapy: Skilled palpation to identify taught and irritable bands in R piriformis and B lumbar multifidi Trigger Point Dry Needling Treatment: Pre-treatment instruction: Patient instructed on dry needling rationale, procedures, and possible side effects including pain during treatment (achy,cramping feeling), bruising, drop of blood, lightheadedness, nausea, sweating. Patient Consent Given: Yes Education handout provided: Yes Muscles treated:  B lumbar multifidi Needle size and number: .30x26mm x 2 Electrical stimulation performed: No Parameters: N/A Treatment response/outcome: Palpable decrease in muscle tension as well as mild paresthesias reported in region Post-treatment instructions: Patient instructed to expect possible mild to moderate muscle soreness later today and/or tomorrow. Patient instructed in methods to reduce muscle soreness and to continue prescribed HEP. If patient was dry needled over the lung field, patient was instructed on signs and symptoms of pneumothorax and, however unlikely, to see immediate medical attention should they occur. Patient was also educated on  signs and symptoms of infection and to seek medical attention should they occur. Patient verbalized understanding of these instructions and education.  Therapeutic Exercise:  QL stretch 30s B  R piriformis stretch 30s x2  R piriformis release 5 min    [facility] Adult PT  Treatment:                                                DATE: 06/22/23 Therapeutic Exercise: Nustep L4 8 min Plank on knees 30s x2 P-ball dead bug 15/15 P-ball curl ups 15x Praying mantis 15x Manual Therapy: Skilled palpation to identify taught and irritable bands in R piriformis and B lumbar multifidi Trigger Point Dry Needling Treatment: Pre-treatment instruction: Patient instructed on dry needling rationale, procedures, and possible side effects including pain during treatment (achy,cramping feeling), bruising, drop of blood, lightheadedness, nausea, sweating. Patient Consent Given: Yes Education handout provided: Yes Muscles treated: R piriformis and B lumbar multifidi Needle size and number: .30x77mm x 3 Electrical stimulation performed: No Parameters: N/A Treatment response/outcome: Palpable decrease in muscle tension as well as mild paresthesias reported in region Post-treatment instructions: Patient instructed to expect possible mild to moderate muscle soreness later today and/or tomorrow. Patient instructed in methods to reduce muscle soreness and to continue prescribed HEP. If patient was dry needled over the lung field, patient was instructed on signs and symptoms of pneumothorax and, however unlikely, to see immediate medical attention should they occur. Patient was also educated on signs and symptoms of infection and to seek medical attention should they occur. Patient verbalized understanding of these instructions and education.   [facility] Adult PT Treatment:                                                DATE: 06/15/23 Therapeutic Exercise: Nustep L2 8 min QL stertch 30s x2 B SKTC 30s x2 B P-ball curl ups 15x Manual Therapy: Skilled palpation to identify taught and irritable bands in L ilicostalis Trigger Point Dry Needling Treatment: Pre-treatment instruction: Patient instructed on dry needling rationale, procedures, and possible side effects including pain during treatment  (achy,cramping feeling), bruising, drop of blood, lightheadedness, nausea, sweating. Patient Consent Given: Yes Education handout provided: Yes Muscles treated: L iliocostalis  Needle size and number: .25x106mm x 1 Electrical stimulation performed: No Parameters: N/A Treatment response/outcome: Palpable decrease in muscle tension as well as mild paresthesias reported in region Post-treatment instructions: Patient instructed to expect possible mild to moderate muscle soreness later today and/or tomorrow. Patient instructed in methods to reduce muscle soreness and to continue prescribed HEP. If patient was dry needled over the lung field, patient was instructed on signs and symptoms of pneumothorax and, however unlikely, to see immediate medical attention should they occur. Patient was also educated on signs and symptoms of infection and to seek medical attention should they occur. Patient verbalized understanding of these instructions and education.    [facility] Adult PT Treatment:                                                DATE: 06/07/23 Therapeutic Exercise: Nustep L4  8 min Prone on elbows 2 min hold Curl up w/p-ball 15x QL stretch 30s x2 Figure 4 bridge 15/15 Dead bug w/p-ball 10/10  Manual Therapy: PA mobs L5-2 Grade II 3x10 per segment  Prone press 10x f/b 10x with PT OP  [facility] Adult PT Treatment:                                                DATE: 05/24/23 Therapeutic Exercise: Nustep L2 8 min Prone prop 2 min QL stretch 30s x2 Piriformis stretch push 30s x2 Figure 4 bridge 15/15 Plank on knees 30s x2 PA mobs L5-2 Grade II 3x10 per segment                                                                                                                   DATE: 05/20/23 Eval    PATIENT EDUCATION:  Education details: Discussed eval findings, rehab rationale and POC and patient is in agreement  Person educated: Patient Education method: Explanation Education comprehension: verbalized  understanding and needs further education  HOME EXERCISE PROGRAM: Access Code: [email or web address] URL: [email or web address] Date: 05/23/2023 Prepared by: Gustavus Bryant  Exercises - Sidelying Open Book Thoracic Lumbar Rotation and Extension  - 2 x daily - 5 x weekly - 1 sets - 10 reps - Supine Figure 4 Piriformis Stretch  - 2 x daily - 5 x weekly - 1 sets - 2 reps - 30s hold - Static Prone on Elbows  - 2 x daily - 5 x weekly - 1 sets - 1 reps - 2 min hold - Plank on Knees  - 1 x daily - 5 x weekly - 1 sets - 2 reps - 30s hold  ASSESSMENT:  CLINICAL IMPRESSION: Patient seen for cervical evaluation following traumatic fall 6-8 weeks ago resulting in acute on chronic upper back and cervical discomfort and loss of mobility.  AROM of cervical spine demonstrates a muscular and soft tissue component vs a vertebrogenic component.  Patient demonstrates full AROM of BUEs however strength deficits identified in R RC due to history of undocumented pathology which she has been offered surgical correction but has declined.  Palpation finds TTP and taught irritable bands in B UT, levator and scalene groups.  Patient is a good candidate for OPPT to improve mobility and resolve soft tissue restriction and irritability.  (Eval)Patient is a 62 y.o. female who was seen today for physical therapy evaluation and treatment for chronic low back pain. Finding show full LE mobility, no nerve tension signs and strength deficits in trunk and BLEs.  Lumbar spring testing finds limited intersegmental mobility and subsequent loss of trunk ROM.  Patient would benefit from OPPT to address spinal stiffness and trunk/LE weakness.   OBJECTIVE IMPAIRMENTS: Abnormal gait, decreased activity tolerance, decreased balance, decreased knowledge of condition, decreased mobility, difficulty walking, decreased strength, hypomobility, impaired flexibility, improper body mechanics, and  pain.   ACTIVITY LIMITATIONS: carrying, lifting,  sitting, standing, squatting, and stairs  PERSONAL FACTORS: Age, Fitness, and Time since onset of injury/illness/exacerbation are also affecting patient's functional outcome.   REHAB POTENTIAL: Good  CLINICAL DECISION MAKING: Stable/uncomplicated  EVALUATION COMPLEXITY: Low   GOALS: Goals reviewed with patient? No  SHORT TERM GOALS: Target date: 06/10/2023  Patient to demonstrate independence in HEP  Baseline: [id number] Goal status: Met   LONG TERM GOALS: Target date: 10/03/2023    Decrease pain to 6/10 Baseline: 10/10 Goal status: INITIAL  2.  Increase trunk ROM to 75% Baseline:  AROM eval  Flexion 75%  Extension 50% P!  Right lateral flexion 50%  Left lateral flexion 50%  Right rotation   Left rotation    Goal status: INITIAL  3.  Increase BLE strength to 4+/5 Baseline:  MMT Right eval Left eval  Hip flexion 4 4  Hip extension 4 4  Hip abduction 4 4  Hip adduction    Hip internal rotation    Hip external rotation    Knee flexion 4 4  Knee extension 4 4  Ankle dorsiflexion    Ankle plantarflexion 4 4   Goal status: INITIAL  4.  Obtain FOTO/ODI and determine functional level; 05/24/23 Goal is 10% Baseline: TBD; 05/24/23 13% Goal status: INITIAL  5.  Restore 75% cervical mobility in all deficit regions Baseline:  Active ROM A/PROM (deg) eval  Flexion 90%  Extension 90%  Right lateral flexion 75%  Left lateral flexion 50%  Right rotation 75%  Left rotation 50%   Goal status: INITIAL  6.   Decrease ODI score to 40% disability Baseline: 46% Goal status: INITIAL   PLAN:  PT FREQUENCY: 2x/week  PT DURATION: 6 weeks  PLANNED INTERVENTIONS: Therapeutic exercises, Therapeutic activity, Neuromuscular re-education, Balance training, Gait training, Patient/Family education, Self Care, Joint mobilization, Stair training, Aquatic Therapy, Dry Needling, Electrical stimulation, Spinal mobilization, Cryotherapy, Moist heat, Manual therapy, and  Re-evaluation.  PLAN FOR NEXT SESSION: HEP review and update, manual techniques as appropriate, aerobic tasks, ROM and flexibility activities, strengthening and PREs, TPDN, gait and balance training as needed     Hildred Laser, PT 08/03/2023, 3:58 PM

## 2023-08-03 NOTE — Therapy (Deleted)
OUTPATIENT PHYSICAL THERAPY CERVICAL EVALUATION   Patient Name: [name] MRN: [id number] DOB:12/23/1960, 62 y.o., female Today's Date: 08/03/2023  END OF SESSION:   Past Medical History:  Diagnosis Date   Arthritis    Bowel obstruction (HCC)    Bronchitis    CHF (congestive heart failure) (HCC)    Chronic systolic dysfunction of left ventricle    Diabetes mellitus without complication (HCC)    on oral meds   GERD (gastroesophageal reflux disease)    Headache(784.0)    migraines, hasn't had one for over a year   History of noncompliance with medical treatment    Hypercholesteremia    Hypertension    LVH (left ventricular hypertrophy)    Nonischemic cardiomyopathy (HCC)    Polysubstance abuse (HCC)    Tobacco abuse disorder    Past Surgical History:  Procedure Laterality Date   ABDOMINAL HYSTERECTOMY     ABDOMINAL SURGERY     left foot surgery     STRABISMUS SURGERY Left 07/31/2013   Procedure: REPAIR STRABISMUS;  Surgeon: Shara Blazing, MD;  Location: [facility] OR;  Service: Ophthalmology;  Laterality: Left;  EYE MUSCLE SURGERY LEFT EYE   UTERINE FIBROID SURGERY     Patient Active Problem List   Diagnosis Date Noted   CKD (chronic kidney disease) 06/28/2018   Chronic systolic CHF (congestive heart failure) (HCC) 06/28/2018   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 06/28/2018   Nephropathy associated with another disease 06/28/2018   Lumbago 06/28/2018   Vitamin D deficiency, unspecified 06/28/2018   Routine general medical examination at a health care facility 06/28/2018   Dry eye syndrome 06/28/2018   Imbalance 10/04/2017   Diabetes (HCC) 08/09/2016   Left acoustic neuroma (HCC) 10/03/2015   Essential hypertension 03/29/2012   Chronic systolic dysfunction of left ventricle 11/24/2011   ACS (acute coronary syndrome) (HCC) 11/23/2011   CHF, acute (HCC) 11/23/2011   Cocaine abuse (HCC) 11/23/2011   Tobacco abuse 11/23/2011    Pneumonitis, aspiration (HCC) 11/23/2011   Cardiomyopathy (HCC) 11/23/2011   HTN (hypertension), malignant 11/23/2011    PCP: Renaye Rakers, MD   REFERRING PROVIDER: Margart Sickles, PA-C  REFERRING DIAG: M54.2 (ICD-10-CM) - Neck pain  THERAPY DIAG:  No diagnosis found.  Rationale for Evaluation and Treatment: Rehabilitation  ONSET DATE: ***  SUBJECTIVE:  SUBJECTIVE STATEMENT: *** Hand dominance: {MISC; OT HAND DOMINANCE:[id number]}  PERTINENT HISTORY:  ***  PAIN:  Are you having pain? {OPRCPAIN:27236}  PRECAUTIONS: None  RED FLAGS: None     WEIGHT BEARING RESTRICTIONS: No  FALLS:  Has patient fallen in last 6 months? Yes. Number of falls 6+  L  OCCUPATION: House keeping  PLOF: Independent  PATIENT GOALS: To manage my neck pain  NEXT MD VISIT: ***  OBJECTIVE:  Note: Objective measures were completed at Evaluation unless otherwise noted.  DIAGNOSTIC FINDINGS:  None available  PATIENT SURVEYS:  NDI ***  POSTURE: {posture:25561}  PALPATION: ***   CERVICAL ROM:   {AROM/PROM:27142} ROM A/PROM (deg) eval  Flexion   Extension   Right lateral flexion   Left lateral flexion   Right rotation   Left rotation    (Blank rows = not tested)  UPPER EXTREMITY ROM:  {AROM/PROM:27142} ROM Right eval Left eval  Shoulder flexion    Shoulder extension    Shoulder abduction    Shoulder adduction    Shoulder extension    Shoulder internal rotation    Shoulder external rotation    Elbow flexion    Elbow extension    Wrist flexion    Wrist extension    Wrist ulnar deviation    Wrist radial deviation    Wrist pronation    Wrist supination     (Blank rows = not tested)  UPPER EXTREMITY MMT:  MMT Right eval Left eval  Shoulder flexion    Shoulder  extension    Shoulder abduction    Shoulder adduction    Shoulder extension    Shoulder internal rotation    Shoulder external rotation    Middle trapezius    Lower trapezius    Elbow flexion    Elbow extension    Wrist flexion    Wrist extension    Wrist ulnar deviation    Wrist radial deviation    Wrist pronation    Wrist supination    Grip strength     (Blank rows = not tested)  CERVICAL SPECIAL TESTS:  {Cervical special tests:25246}  FUNCTIONAL TESTS:  {Functional tests:24029}  TODAY'S TREATMENT:                                                                                                                              DATE: ***   PATIENT EDUCATION:  Education details: *** Person educated: {Person educated:25204} Education method: {Education Method:25205} Education comprehension: {Education Comprehension:25206}  HOME EXERCISE PROGRAM: ***  ASSESSMENT:  CLINICAL IMPRESSION: Patient is a *** y.o. *** who was seen today for physical therapy evaluation and treatment for ***.   OBJECTIVE IMPAIRMENTS: {opptimpairments:25111}.   ACTIVITY LIMITATIONS: {activitylimitations:27494}  PARTICIPATION LIMITATIONS: {participationrestrictions:25113}  PERSONAL FACTORS: {Personal factors:25162} are also affecting patient's functional outcome.   REHAB POTENTIAL: {rehabpotential:25112}  CLINICAL DECISION MAKING: {clinical decision making:25114}  EVALUATION COMPLEXITY: {Evaluation complexity:25115}   GOALS: Goals reviewed with patient? {yes/no:20286}  SHORT  TERM GOALS: Target date: ***  *** Baseline:  Goal status: INITIAL  2.  *** Baseline:  Goal status: INITIAL  3.  *** Baseline:  Goal status: INITIAL  4.  *** Baseline:  Goal status: INITIAL  5.  *** Baseline:  Goal status: INITIAL  6.  *** Baseline:  Goal status: INITIAL  LONG TERM GOALS: Target date: ***  *** Baseline:  Goal status: INITIAL  2.  *** Baseline:  Goal status: INITIAL  3.   *** Baseline:  Goal status: INITIAL  4.  *** Baseline:  Goal status: INITIAL  5.  *** Baseline:  Goal status: INITIAL  6.  *** Baseline:  Goal status: INITIAL   PLAN:  PT FREQUENCY: {rehab frequency:25116}  PT DURATION: {rehab duration:25117}  PLANNED INTERVENTIONS: {rehab planned interventions:25118::"Therapeutic exercises","Therapeutic activity","Neuromuscular re-education","Balance training","Gait training","Patient/Family education","Self Care","Joint mobilization"}  PLAN FOR NEXT SESSION: ***   Hildred Laser, PT 08/03/2023, 2:44 PM

## 2023-08-04 ENCOUNTER — Ambulatory Visit: Payer: 59

## 2023-08-08 NOTE — Therapy (Unsigned)
OUTPATIENT PHYSICAL THERAPY TREATMENT NOTE   Patient Name: [name] MRN: [id number] DOB:29-May-1961, 62 y.o., female Today's Date: 08/09/2023  END OF SESSION:  PT End of Session - 08/09/23 1620     Visit Number 9    Number of Visits 13    Date for PT Re-Evaluation 10/03/23    Authorization Type [employer] MCR    PT Start Time 1620    PT Stop Time 1700    PT Time Calculation (min) 40 min    Activity Tolerance Patient tolerated treatment well    Behavior During Therapy Impulsive;WFL for tasks assessed/performed             Past Medical History:  Diagnosis Date   Arthritis    Bowel obstruction (HCC)    Bronchitis    CHF (congestive heart failure) (HCC)    Chronic systolic dysfunction of left ventricle    Diabetes mellitus without complication (HCC)    on oral meds   GERD (gastroesophageal reflux disease)    Headache(784.0)    migraines, hasn't had one for over a year   History of noncompliance with medical treatment    Hypercholesteremia    Hypertension    LVH (left ventricular hypertrophy)    Nonischemic cardiomyopathy (HCC)    Polysubstance abuse (HCC)    Tobacco abuse disorder    Past Surgical History:  Procedure Laterality Date   ABDOMINAL HYSTERECTOMY     ABDOMINAL SURGERY     left foot surgery     STRABISMUS SURGERY Left 07/31/2013   Procedure: REPAIR STRABISMUS;  Surgeon: Shara Blazing, MD;  Location: [facility] OR;  Service: Ophthalmology;  Laterality: Left;  EYE MUSCLE SURGERY LEFT EYE   UTERINE FIBROID SURGERY     Patient Active Problem List   Diagnosis Date Noted   CKD (chronic kidney disease) 06/28/2018   Chronic systolic CHF (congestive heart failure) (HCC) 06/28/2018   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 06/28/2018   Nephropathy associated with another disease 06/28/2018   Lumbago 06/28/2018   Vitamin D deficiency, unspecified 06/28/2018   Routine general medical examination at a health care facility 06/28/2018   Dry  eye syndrome 06/28/2018   Imbalance 10/04/2017   Diabetes (HCC) 08/09/2016   Left acoustic neuroma (HCC) 10/03/2015   Essential hypertension 03/29/2012   Chronic systolic dysfunction of left ventricle 11/24/2011   ACS (acute coronary syndrome) (HCC) 11/23/2011   CHF, acute (HCC) 11/23/2011   Cocaine abuse (HCC) 11/23/2011   Tobacco abuse 11/23/2011   Pneumonitis, aspiration (HCC) 11/23/2011   Cardiomyopathy (HCC) 11/23/2011   HTN (hypertension), malignant 11/23/2011    PCP: Renaye Rakers, MD   REFERRING PROVIDER: Estanislado Spire PA  REFERRING DIAG: L4 radiculopathy, cervicalgia  Rationale for Evaluation and Treatment: Rehabilitation  THERAPY DIAG:  Other abnormalities of gait and mobility  Muscle weakness (generalized)  Cervicalgia  Other low back pain  ONSET DATE: chronic  SUBJECTIVE:  SUBJECTIVE STATEMENT: Arrives with c/o low back, cervical and shoulder pain.  Elects to focus on cervical region and opts for TPDN to B UT  PERTINENT HISTORY:  None available for cervical region  PAIN:  Are you having pain? Yes: NPRS scale: 10/10 Pain location: low back  Pain description: ache Aggravating factors: twisting, sudden movements Relieving factors: position changes  Are you having pain? Yes: NPRS scale: 10/10 Pain location: neck and upper back Pain description: ache Aggravating factors: activity and prolonged positioning Relieving factors: position changes and rest  PRECAUTIONS: None  RED FLAGS: None   WEIGHT BEARING RESTRICTIONS: No  FALLS:  Has patient fallen in last 6 months? Yes. Number of falls 6+  OCCUPATION: part time house keeping  PLOF: Independent  PATIENT GOALS: To reduce and manage my back pain  NEXT MD VISIT: as needed  OBJECTIVE:   DIAGNOSTIC FINDINGS:   None recent  PATIENT SURVEYS:  ODI TBD; 05/24/23 13  NDI 08/03/23 23/50 46% perceived disability    MUSCLE LENGTH: Hamstrings: Right 90 deg; Left 90 deg PKB unremarkable B  POSTURE: decreased lumbar lordosis Rounded and elevated shoulders  PALPATION: Point tender over B greater trochanters and B gluteus medius 08/03/23 TTP of B UT, levators and scalenes  LUMBAR ROM:   AROM eval 07/13/23  Flexion 75% 75%  Extension 50% P! 50%  Right lateral flexion 50% 50%  Left lateral flexion 50% 50%  Right rotation  75%  Left rotation  75%   (Blank rows = not tested)  LOWER EXTREMITY ROM:   WNL B  Active  Right eval Left eval  Hip flexion    Hip extension    Hip abduction    Hip adduction    Hip internal rotation    Hip external rotation    Knee flexion    Knee extension    Ankle dorsiflexion    Ankle plantarflexion    Ankle inversion    Ankle eversion     (Blank rows = not tested)  LOWER EXTREMITY MMT:    MMT Right eval Left eval  Hip flexion 4 4  Hip extension 4 4  Hip abduction 4 4  Hip adduction    Hip internal rotation    Hip external rotation    Knee flexion 4 4  Knee extension 4 4  Ankle dorsiflexion    Ankle plantarflexion 4 4  Ankle inversion    Ankle eversion     (Blank rows = not tested) CERVICAL ROM:   Active ROM A/PROM (deg) eval  Flexion 90%  Extension 90%  Right lateral flexion 75%  Left lateral flexion 50%  Right rotation 75%  Left rotation 50%   UPPER EXTREMITY MMT:  MMT Right eval Left eval  Shoulder flexion 3+ 4  Shoulder extension 4 4  Shoulder abduction 3+ 4  Shoulder adduction    Shoulder extension    Shoulder internal rotation 4 4  Shoulder external rotation 3+ 4  Middle trapezius    Lower trapezius    Elbow flexion    Elbow extension    Wrist flexion    Wrist extension    Wrist ulnar deviation    Wrist radial deviation    Wrist pronation    Wrist supination    Grip strength     UPPER EXTREMITY ROM: WFL  B   Active ROM Right eval Left eval  Shoulder flexion    Shoulder extension    Shoulder abduction    Shoulder adduction  Shoulder extension    Shoulder internal rotation    Shoulder external rotation    Elbow flexion    Elbow extension    Wrist flexion    Wrist extension    Wrist ulnar deviation    Wrist radial deviation    Wrist pronation    Wrist supination     (Blank rows = not tested)  LUMBAR SPECIAL TESTS:  Straight leg raise test: Negative, Slump test: Negative, and Stork standing: Negative 07/13/23 Slump negative B  FUNCTIONAL TESTS:  5 times sit to stand: 9s arms crossed  GAIT: Distance walked: 49ftx2 Assistive device utilized: None Level of assistance: Complete Independence Comments: minimal trunk rotation  TODAY'S TREATMENT:     [facility] Adult PT Treatment:                                                DATE: 08/09/23 Therapeutic Exercise: Nustep L2 6 min QL stertch 30s x2 B Curl ups with p-ball 15x B, 15/15 unilaterally B UT stretch 30s x2 Manual Therapy: Skilled palpation to identify taught and irritable bands in B UT Trigger Point Dry Needling Treatment: Pre-treatment instruction: Patient instructed on dry needling rationale, procedures, and possible side effects including pain during treatment (achy,cramping feeling), bruising, drop of blood, lightheadedness, nausea, sweating. Patient Consent Given: Yes Education handout provided: No Muscles treated: B UT  Needle size and number: .25x27mm x 2 Electrical stimulation performed: No Parameters: N/A Treatment response/outcome: Twitch response elicited and Palpable decrease in muscle tension Post-treatment instructions: Patient instructed to expect possible mild to moderate muscle soreness later today and/or tomorrow. Patient instructed in methods to reduce muscle soreness and to continue prescribed HEP. If patient was dry needled over the lung field, patient was instructed on signs and symptoms of  pneumothorax and, however unlikely, to see immediate medical attention should they occur. Patient was also educated on signs and symptoms of infection and to seek medical attention should they occur. Patient verbalized understanding of these instructions and education.    [facility] Adult PT Treatment:                                                DATE: 08/03/23 08/03/23: Eval cervical spine  [facility] Adult PT Treatment:                                                DATE: 07/20/23 Therapeutic Exercise: Nustep L4  QL stertch 30s x2 B Curl ups with p-ball 15x B, 15/15 unilaterally POE 2 min Manual Therapy: Skilled palpation to identify taught and irritable bands in B lumbar multifidi  Trigger Point Dry Needling Treatment: Pre-treatment instruction: Patient instructed on dry needling rationale, procedures, and possible side effects including pain during treatment (achy,cramping feeling), bruising, drop of blood, lightheadedness, nausea, sweating. Patient Consent Given: Yes Education handout provided: Previously provided Muscles treated: B lumbar multifidi   Needle size and number: .30x42mm x 2 Electrical stimulation performed: No Parameters: N/A Treatment response/outcome: Twitch response elicited, Palpable decrease in muscle tension, and less discomfort Post-treatment instructions: Patient instructed to expect possible mild to moderate muscle  soreness later today and/or tomorrow. Patient instructed in methods to reduce muscle soreness and to continue prescribed HEP. If patient was dry needled over the lung field, patient was instructed on signs and symptoms of pneumothorax and, however unlikely, to see immediate medical attention should they occur. Patient was also educated on signs and symptoms of infection and to seek medical attention should they occur. Patient verbalized understanding of these instructions and education.     [facility] Adult PT Treatment:                                                 DATE: 07/13/23 Re-assssment Manual Therapy: Skilled palpation to identify taught and irritable bands in R piriformis and B lumbar multifidi Trigger Point Dry Needling Treatment: Pre-treatment instruction: Patient instructed on dry needling rationale, procedures, and possible side effects including pain during treatment (achy,cramping feeling), bruising, drop of blood, lightheadedness, nausea, sweating. Patient Consent Given: Yes Education handout provided: Yes Muscles treated:  B lumbar multifidi Needle size and number: .30x57mm x 2 Electrical stimulation performed: No Parameters: N/A Treatment response/outcome: Palpable decrease in muscle tension as well as mild paresthesias reported in region Post-treatment instructions: Patient instructed to expect possible mild to moderate muscle soreness later today and/or tomorrow. Patient instructed in methods to reduce muscle soreness and to continue prescribed HEP. If patient was dry needled over the lung field, patient was instructed on signs and symptoms of pneumothorax and, however unlikely, to see immediate medical attention should they occur. Patient was also educated on signs and symptoms of infection and to seek medical attention should they occur. Patient verbalized understanding of these instructions and education.  Therapeutic Exercise:  QL stretch 30s B  R piriformis stretch 30s x2  R piriformis release 5 min    [facility] Adult PT Treatment:                                                DATE: 06/22/23 Therapeutic Exercise: Nustep L4 8 min Plank on knees 30s x2 P-ball dead bug 15/15 P-ball curl ups 15x Praying mantis 15x Manual Therapy: Skilled palpation to identify taught and irritable bands in R piriformis and B lumbar multifidi Trigger Point Dry Needling Treatment: Pre-treatment instruction: Patient instructed on dry needling rationale, procedures, and possible side effects including pain during treatment (achy,cramping feeling),  bruising, drop of blood, lightheadedness, nausea, sweating. Patient Consent Given: Yes Education handout provided: Yes Muscles treated: R piriformis and B lumbar multifidi Needle size and number: .30x34mm x 3 Electrical stimulation performed: No Parameters: N/A Treatment response/outcome: Palpable decrease in muscle tension as well as mild paresthesias reported in region Post-treatment instructions: Patient instructed to expect possible mild to moderate muscle soreness later today and/or tomorrow. Patient instructed in methods to reduce muscle soreness and to continue prescribed HEP. If patient was dry needled over the lung field, patient was instructed on signs and symptoms of pneumothorax and, however unlikely, to see immediate medical attention should they occur. Patient was also educated on signs and symptoms of infection and to seek medical attention should they occur. Patient verbalized understanding of these instructions and education.   [facility] Adult PT Treatment:  DATE: 06/15/23 Therapeutic Exercise: Nustep L2 8 min QL stertch 30s x2 B SKTC 30s x2 B P-ball curl ups 15x Manual Therapy: Skilled palpation to identify taught and irritable bands in L ilicostalis Trigger Point Dry Needling Treatment: Pre-treatment instruction: Patient instructed on dry needling rationale, procedures, and possible side effects including pain during treatment (achy,cramping feeling), bruising, drop of blood, lightheadedness, nausea, sweating. Patient Consent Given: Yes Education handout provided: Yes Muscles treated: L iliocostalis  Needle size and number: .25x28mm x 1 Electrical stimulation performed: No Parameters: N/A Treatment response/outcome: Palpable decrease in muscle tension as well as mild paresthesias reported in region Post-treatment instructions: Patient instructed to expect possible mild to moderate muscle soreness later today and/or tomorrow.  Patient instructed in methods to reduce muscle soreness and to continue prescribed HEP. If patient was dry needled over the lung field, patient was instructed on signs and symptoms of pneumothorax and, however unlikely, to see immediate medical attention should they occur. Patient was also educated on signs and symptoms of infection and to seek medical attention should they occur. Patient verbalized understanding of these instructions and education.    [facility] Adult PT Treatment:                                                DATE: 06/07/23 Therapeutic Exercise: Nustep L4 8 min Prone on elbows 2 min hold Curl up w/p-ball 15x QL stretch 30s x2 Figure 4 bridge 15/15 Dead bug w/p-ball 10/10  Manual Therapy: PA mobs L5-2 Grade II 3x10 per segment  Prone press 10x f/b 10x with PT OP  [facility] Adult PT Treatment:                                                DATE: 05/24/23 Therapeutic Exercise: Nustep L2 8 min Prone prop 2 min QL stretch 30s x2 Piriformis stretch push 30s x2 Figure 4 bridge 15/15 Plank on knees 30s x2 PA mobs L5-2 Grade II 3x10 per segment                                                                                                                   DATE: 05/20/23 Eval    PATIENT EDUCATION:  Education details: Discussed eval findings, rehab rationale and POC and patient is in agreement  Person educated: Patient Education method: Explanation Education comprehension: verbalized understanding and needs further education  HOME EXERCISE PROGRAM: Access Code: [email or web address] URL: [email or web address] Date: 05/23/2023 Prepared by: Gustavus Bryant  Exercises - Sidelying Open Book Thoracic Lumbar Rotation and Extension  - 2 x daily - 5 x weekly - 1 sets - 10 reps - Supine Figure 4 Piriformis Stretch  - 2 x daily - 5 x weekly - 1 sets - 2 reps -  30s hold - Static Prone on Elbows  - 2 x daily - 5 x weekly - 1 sets - 1 reps - 2 min hold - Plank on Knees  - 1 x daily - 5 x  weekly - 1 sets - 2 reps - 30s hold  ASSESSMENT:  CLINICAL IMPRESSION: Todays session addressed cervical issues, primarily, through TPDN and stretching.  Continued core strength and lumbar flexibility tasks as well.  Able to complete all exercises w/o exacerbation of symptoms and noted relief at end of session.  Symptom flare-up most likely due to death of family member   (Eval)Patient seen for cervical evaluation following traumatic fall 6-8 weeks ago resulting in acute on chronic upper back and cervical discomfort and loss of mobility.  AROM of cervical spine demonstrates a muscular and soft tissue component vs a vertebrogenic component.  Patient demonstrates full AROM of BUEs however strength deficits identified in R RC due to history of undocumented pathology which she has been offered surgical correction but has declined.  Palpation finds TTP and taught irritable bands in B UT, levator and scalene groups.  Patient is a good candidate for OPPT to improve mobility and resolve soft tissue restriction and irritability.  (Eval)Patient is a 62 y.o. female who was seen today for physical therapy evaluation and treatment for chronic low back pain. Finding show full LE mobility, no nerve tension signs and strength deficits in trunk and BLEs.  Lumbar spring testing finds limited intersegmental mobility and subsequent loss of trunk ROM.  Patient would benefit from OPPT to address spinal stiffness and trunk/LE weakness.   OBJECTIVE IMPAIRMENTS: Abnormal gait, decreased activity tolerance, decreased balance, decreased knowledge of condition, decreased mobility, difficulty walking, decreased strength, hypomobility, impaired flexibility, improper body mechanics, and pain.   ACTIVITY LIMITATIONS: carrying, lifting, sitting, standing, squatting, and stairs  PERSONAL FACTORS: Age, Fitness, and Time since onset of injury/illness/exacerbation are also affecting patient's functional outcome.   REHAB POTENTIAL:  Good  CLINICAL DECISION MAKING: Stable/uncomplicated  EVALUATION COMPLEXITY: Low   GOALS: Goals reviewed with patient? No  SHORT TERM GOALS: Target date: 06/10/2023  Patient to demonstrate independence in HEP  Baseline: [id number] Goal status: Met   LONG TERM GOALS: Target date: 10/03/2023    Decrease pain to 6/10 Baseline: 10/10 Goal status: INITIAL  2.  Increase trunk ROM to 75% Baseline:  AROM eval  Flexion 75%  Extension 50% P!  Right lateral flexion 50%  Left lateral flexion 50%  Right rotation   Left rotation    Goal status: INITIAL  3.  Increase BLE strength to 4+/5 Baseline:  MMT Right eval Left eval  Hip flexion 4 4  Hip extension 4 4  Hip abduction 4 4  Hip adduction    Hip internal rotation    Hip external rotation    Knee flexion 4 4  Knee extension 4 4  Ankle dorsiflexion    Ankle plantarflexion 4 4   Goal status: INITIAL  4.  Obtain FOTO/ODI and determine functional level; 05/24/23 Goal is 10% Baseline: TBD; 05/24/23 13% Goal status: INITIAL  5.  Restore 75% cervical mobility in all deficit regions Baseline:  Active ROM A/PROM (deg) eval  Flexion 90%  Extension 90%  Right lateral flexion 75%  Left lateral flexion 50%  Right rotation 75%  Left rotation 50%   Goal status: INITIAL  6.   Decrease ODI score to 40% disability Baseline: 46% Goal status: INITIAL   PLAN:  PT FREQUENCY: 2x/week  PT DURATION: 6  weeks  PLANNED INTERVENTIONS: Therapeutic exercises, Therapeutic activity, Neuromuscular re-education, Balance training, Gait training, Patient/Family education, Self Care, Joint mobilization, Stair training, Aquatic Therapy, Dry Needling, Electrical stimulation, Spinal mobilization, Cryotherapy, Moist heat, Manual therapy, and Re-evaluation.  PLAN FOR NEXT SESSION: HEP review and update, manual techniques as appropriate, aerobic tasks, ROM and flexibility activities, strengthening and PREs, TPDN, gait and balance training as  needed     Hildred Laser, PT 08/09/2023, 5:21 PM

## 2023-08-09 ENCOUNTER — Ambulatory Visit: Payer: 59

## 2023-08-09 DIAGNOSIS — M6281 Muscle weakness (generalized): Secondary | ICD-10-CM

## 2023-08-09 DIAGNOSIS — R2689 Other abnormalities of gait and mobility: Secondary | ICD-10-CM

## 2023-08-09 DIAGNOSIS — M5459 Other low back pain: Secondary | ICD-10-CM

## 2023-08-09 DIAGNOSIS — M542 Cervicalgia: Secondary | ICD-10-CM

## 2023-08-11 ENCOUNTER — Ambulatory Visit: Payer: 59

## 2023-08-11 DIAGNOSIS — M5459 Other low back pain: Secondary | ICD-10-CM

## 2023-08-11 DIAGNOSIS — R2689 Other abnormalities of gait and mobility: Secondary | ICD-10-CM | POA: Diagnosis not present

## 2023-08-11 DIAGNOSIS — M542 Cervicalgia: Secondary | ICD-10-CM | POA: Diagnosis not present

## 2023-08-11 DIAGNOSIS — M6281 Muscle weakness (generalized): Secondary | ICD-10-CM

## 2023-08-11 NOTE — Therapy (Signed)
OUTPATIENT PHYSICAL THERAPY TREATMENT NOTE   Patient Name: [name] MRN: [id number] DOB:Jul 09, 1961, 62 y.o., female Today's Date: 08/11/2023  END OF SESSION:  PT End of Session - 08/11/23 1323     Visit Number 10    Number of Visits 13    Date for PT Re-Evaluation 10/03/23    Authorization Type [employer] MCR    PT Start Time 1320    PT Stop Time 1400    PT Time Calculation (min) 40 min    Activity Tolerance Patient tolerated treatment well    Behavior During Therapy Impulsive;WFL for tasks assessed/performed             Past Medical History:  Diagnosis Date   Arthritis    Bowel obstruction (HCC)    Bronchitis    CHF (congestive heart failure) (HCC)    Chronic systolic dysfunction of left ventricle    Diabetes mellitus without complication (HCC)    on oral meds   GERD (gastroesophageal reflux disease)    Headache(784.0)    migraines, hasn't had one for over a year   History of noncompliance with medical treatment    Hypercholesteremia    Hypertension    LVH (left ventricular hypertrophy)    Nonischemic cardiomyopathy (HCC)    Polysubstance abuse (HCC)    Tobacco abuse disorder    Past Surgical History:  Procedure Laterality Date   ABDOMINAL HYSTERECTOMY     ABDOMINAL SURGERY     left foot surgery     STRABISMUS SURGERY Left 07/31/2013   Procedure: REPAIR STRABISMUS;  Surgeon: Shara Blazing, MD;  Location: [facility] OR;  Service: Ophthalmology;  Laterality: Left;  EYE MUSCLE SURGERY LEFT EYE   UTERINE FIBROID SURGERY     Patient Active Problem List   Diagnosis Date Noted   CKD (chronic kidney disease) 06/28/2018   Chronic systolic CHF (congestive heart failure) (HCC) 06/28/2018   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 06/28/2018   Nephropathy associated with another disease 06/28/2018   Lumbago 06/28/2018   Vitamin D deficiency, unspecified 06/28/2018   Routine general medical examination at a health care facility 06/28/2018   Dry  eye syndrome 06/28/2018   Imbalance 10/04/2017   Diabetes (HCC) 08/09/2016   Left acoustic neuroma (HCC) 10/03/2015   Essential hypertension 03/29/2012   Chronic systolic dysfunction of left ventricle 11/24/2011   ACS (acute coronary syndrome) (HCC) 11/23/2011   CHF, acute (HCC) 11/23/2011   Cocaine abuse (HCC) 11/23/2011   Tobacco abuse 11/23/2011   Pneumonitis, aspiration (HCC) 11/23/2011   Cardiomyopathy (HCC) 11/23/2011   HTN (hypertension), malignant 11/23/2011    PCP: Renaye Rakers, MD   REFERRING PROVIDER: Estanislado Spire PA  REFERRING DIAG: L4 radiculopathy, cervicalgia  Rationale for Evaluation and Treatment: Rehabilitation  THERAPY DIAG:  Other abnormalities of gait and mobility  Cervicalgia  Muscle weakness (generalized)  Other low back pain  ONSET DATE: chronic  SUBJECTIVE:  SUBJECTIVE STATEMENT: 5/10 discomfort levels in B UT region.  Elects to TPDN B UT region again.  Symptoms aggravated by stress.  PERTINENT HISTORY:  None available for cervical region  PAIN:  Are you having pain? Yes: NPRS scale: 10/10 Pain location: low back  Pain description: ache Aggravating factors: twisting, sudden movements Relieving factors: position changes  Are you having pain? Yes: NPRS scale: 10/10 Pain location: neck and upper back Pain description: ache Aggravating factors: activity and prolonged positioning Relieving factors: position changes and rest  PRECAUTIONS: None  RED FLAGS: None   WEIGHT BEARING RESTRICTIONS: No  FALLS:  Has patient fallen in last 6 months? Yes. Number of falls 6+  OCCUPATION: part time house keeping  PLOF: Independent  PATIENT GOALS: To reduce and manage my back pain  NEXT MD VISIT: as needed  OBJECTIVE:   DIAGNOSTIC FINDINGS:  None  recent  PATIENT SURVEYS:  ODI TBD; 05/24/23 13  NDI 08/03/23 23/50 46% perceived disability    MUSCLE LENGTH: Hamstrings: Right 90 deg; Left 90 deg PKB unremarkable B  POSTURE: decreased lumbar lordosis Rounded and elevated shoulders  PALPATION: Point tender over B greater trochanters and B gluteus medius 08/03/23 TTP of B UT, levators and scalenes  LUMBAR ROM:   AROM eval 07/13/23  Flexion 75% 75%  Extension 50% P! 50%  Right lateral flexion 50% 50%  Left lateral flexion 50% 50%  Right rotation  75%  Left rotation  75%   (Blank rows = not tested)  LOWER EXTREMITY ROM:   WNL B  Active  Right eval Left eval  Hip flexion    Hip extension    Hip abduction    Hip adduction    Hip internal rotation    Hip external rotation    Knee flexion    Knee extension    Ankle dorsiflexion    Ankle plantarflexion    Ankle inversion    Ankle eversion     (Blank rows = not tested)  LOWER EXTREMITY MMT:    MMT Right eval Left eval  Hip flexion 4 4  Hip extension 4 4  Hip abduction 4 4  Hip adduction    Hip internal rotation    Hip external rotation    Knee flexion 4 4  Knee extension 4 4  Ankle dorsiflexion    Ankle plantarflexion 4 4  Ankle inversion    Ankle eversion     (Blank rows = not tested) CERVICAL ROM:   Active ROM A/PROM (deg) eval  Flexion 90%  Extension 90%  Right lateral flexion 75%  Left lateral flexion 50%  Right rotation 75%  Left rotation 50%   UPPER EXTREMITY MMT:  MMT Right eval Left eval  Shoulder flexion 3+ 4  Shoulder extension 4 4  Shoulder abduction 3+ 4  Shoulder adduction    Shoulder extension    Shoulder internal rotation 4 4  Shoulder external rotation 3+ 4  Middle trapezius    Lower trapezius    Elbow flexion    Elbow extension    Wrist flexion    Wrist extension    Wrist ulnar deviation    Wrist radial deviation    Wrist pronation    Wrist supination    Grip strength     UPPER EXTREMITY ROM: WFL B   Active  ROM Right eval Left eval  Shoulder flexion    Shoulder extension    Shoulder abduction    Shoulder adduction    Shoulder extension  Shoulder internal rotation    Shoulder external rotation    Elbow flexion    Elbow extension    Wrist flexion    Wrist extension    Wrist ulnar deviation    Wrist radial deviation    Wrist pronation    Wrist supination     (Blank rows = not tested)  LUMBAR SPECIAL TESTS:  Straight leg raise test: Negative, Slump test: Negative, and Stork standing: Negative 07/13/23 Slump negative B  FUNCTIONAL TESTS:  5 times sit to stand: 9s arms crossed  GAIT: Distance walked: 44ftx2 Assistive device utilized: None Level of assistance: Complete Independence Comments: minimal trunk rotation  TODAY'S TREATMENT:     [facility] Adult PT Treatment:                                                DATE: 08/11/23 Therapeutic Exercise: ER YTB 15x Supine Hor abd YTB 15x Supine chin tuck over towel roll 2s hold 10x2 B UT stretch 30s x2 Supine alternating shoulder flexion 1# 15/15 Chin tuck over towel 10x2 Manual Therapy: Skilled palpation to identify taught and irritable bands in B UT as well as paracervicals Trigger Point Dry Needling Treatment: Pre-treatment instruction: Patient instructed on dry needling rationale, procedures, and possible side effects including pain during treatment (achy,cramping feeling), bruising, drop of blood, lightheadedness, nausea, sweating. Patient Consent Given: Yes Education handout provided: No Muscles treated: B UT  Needle size and number: .25x91mm x 2 Electrical stimulation performed: No Parameters: N/A Treatment response/outcome: Twitch response elicited and Palpable decrease in muscle tension Post-treatment instructions: Patient instructed to expect possible mild to moderate muscle soreness later today and/or tomorrow. Patient instructed in methods to reduce muscle soreness and to continue prescribed HEP. If patient was dry  needled over the lung field, patient was instructed on signs and symptoms of pneumothorax and, however unlikely, to see immediate medical attention should they occur. Patient was also educated on signs and symptoms of infection and to seek medical attention should they occur. Patient verbalized understanding of these instructions and education.   [facility] Adult PT Treatment:                                                DATE: 08/09/23 Therapeutic Exercise: Nustep L2 6 min QL stertch 30s x2 B Curl ups with p-ball 15x B, 15/15 unilaterally B UT stretch 30s x2 Manual Therapy: Skilled palpation to identify taught and irritable bands in B UT Trigger Point Dry Needling Treatment: Pre-treatment instruction: Patient instructed on dry needling rationale, procedures, and possible side effects including pain during treatment (achy,cramping feeling), bruising, drop of blood, lightheadedness, nausea, sweating. Patient Consent Given: Yes Education handout provided: No Muscles treated: B UT  Needle size and number: .25x3mm x 2 Electrical stimulation performed: No Parameters: N/A Treatment response/outcome: Twitch response elicited and Palpable decrease in muscle tension Post-treatment instructions: Patient instructed to expect possible mild to moderate muscle soreness later today and/or tomorrow. Patient instructed in methods to reduce muscle soreness and to continue prescribed HEP. If patient was dry needled over the lung field, patient was instructed on signs and symptoms of pneumothorax and, however unlikely, to see immediate medical attention should they occur. Patient was also educated on signs  and symptoms of infection and to seek medical attention should they occur. Patient verbalized understanding of these instructions and education.    [facility] Adult PT Treatment:                                                DATE: 08/03/23 08/03/23: Eval cervical spine  [facility] Adult PT Treatment:                                                 DATE: 07/20/23 Therapeutic Exercise: Nustep L4  QL stertch 30s x2 B Curl ups with p-ball 15x B, 15/15 unilaterally POE 2 min Manual Therapy: Skilled palpation to identify taught and irritable bands in B lumbar multifidi  Trigger Point Dry Needling Treatment: Pre-treatment instruction: Patient instructed on dry needling rationale, procedures, and possible side effects including pain during treatment (achy,cramping feeling), bruising, drop of blood, lightheadedness, nausea, sweating. Patient Consent Given: Yes Education handout provided: Previously provided Muscles treated: B lumbar multifidi   Needle size and number: .30x109mm x 2 Electrical stimulation performed: No Parameters: N/A Treatment response/outcome: Twitch response elicited, Palpable decrease in muscle tension, and less discomfort Post-treatment instructions: Patient instructed to expect possible mild to moderate muscle soreness later today and/or tomorrow. Patient instructed in methods to reduce muscle soreness and to continue prescribed HEP. If patient was dry needled over the lung field, patient was instructed on signs and symptoms of pneumothorax and, however unlikely, to see immediate medical attention should they occur. Patient was also educated on signs and symptoms of infection and to seek medical attention should they occur. Patient verbalized understanding of these instructions and education.     [facility] Adult PT Treatment:                                                DATE: 07/13/23 Re-assssment Manual Therapy: Skilled palpation to identify taught and irritable bands in R piriformis and B lumbar multifidi Trigger Point Dry Needling Treatment: Pre-treatment instruction: Patient instructed on dry needling rationale, procedures, and possible side effects including pain during treatment (achy,cramping feeling), bruising, drop of blood, lightheadedness, nausea, sweating. Patient Consent Given: Yes Education  handout provided: Yes Muscles treated:  B lumbar multifidi Needle size and number: .30x8mm x 2 Electrical stimulation performed: No Parameters: N/A Treatment response/outcome: Palpable decrease in muscle tension as well as mild paresthesias reported in region Post-treatment instructions: Patient instructed to expect possible mild to moderate muscle soreness later today and/or tomorrow. Patient instructed in methods to reduce muscle soreness and to continue prescribed HEP. If patient was dry needled over the lung field, patient was instructed on signs and symptoms of pneumothorax and, however unlikely, to see immediate medical attention should they occur. Patient was also educated on signs and symptoms of infection and to seek medical attention should they occur. Patient verbalized understanding of these instructions and education.  Therapeutic Exercise:  QL stretch 30s B  R piriformis stretch 30s x2  R piriformis release 5 min    [facility] Adult PT Treatment:  DATE: 06/22/23 Therapeutic Exercise: Nustep L4 8 min Plank on knees 30s x2 P-ball dead bug 15/15 P-ball curl ups 15x Praying mantis 15x Manual Therapy: Skilled palpation to identify taught and irritable bands in R piriformis and B lumbar multifidi Trigger Point Dry Needling Treatment: Pre-treatment instruction: Patient instructed on dry needling rationale, procedures, and possible side effects including pain during treatment (achy,cramping feeling), bruising, drop of blood, lightheadedness, nausea, sweating. Patient Consent Given: Yes Education handout provided: Yes Muscles treated: R piriformis and B lumbar multifidi Needle size and number: .30x14mm x 3 Electrical stimulation performed: No Parameters: N/A Treatment response/outcome: Palpable decrease in muscle tension as well as mild paresthesias reported in region Post-treatment instructions: Patient instructed to expect possible mild to  moderate muscle soreness later today and/or tomorrow. Patient instructed in methods to reduce muscle soreness and to continue prescribed HEP. If patient was dry needled over the lung field, patient was instructed on signs and symptoms of pneumothorax and, however unlikely, to see immediate medical attention should they occur. Patient was also educated on signs and symptoms of infection and to seek medical attention should they occur. Patient verbalized understanding of these instructions and education.   [facility] Adult PT Treatment:                                                DATE: 06/15/23 Therapeutic Exercise: Nustep L2 8 min QL stertch 30s x2 B SKTC 30s x2 B P-ball curl ups 15x Manual Therapy: Skilled palpation to identify taught and irritable bands in L ilicostalis Trigger Point Dry Needling Treatment: Pre-treatment instruction: Patient instructed on dry needling rationale, procedures, and possible side effects including pain during treatment (achy,cramping feeling), bruising, drop of blood, lightheadedness, nausea, sweating. Patient Consent Given: Yes Education handout provided: Yes Muscles treated: L iliocostalis  Needle size and number: .25x69mm x 1 Electrical stimulation performed: No Parameters: N/A Treatment response/outcome: Palpable decrease in muscle tension as well as mild paresthesias reported in region Post-treatment instructions: Patient instructed to expect possible mild to moderate muscle soreness later today and/or tomorrow. Patient instructed in methods to reduce muscle soreness and to continue prescribed HEP. If patient was dry needled over the lung field, patient was instructed on signs and symptoms of pneumothorax and, however unlikely, to see immediate medical attention should they occur. Patient was also educated on signs and symptoms of infection and to seek medical attention should they occur. Patient verbalized understanding of these instructions and education.    [facility]  Adult PT Treatment:                                                DATE: 06/07/23 Therapeutic Exercise: Nustep L4 8 min Prone on elbows 2 min hold Curl up w/p-ball 15x QL stretch 30s x2 Figure 4 bridge 15/15 Dead bug w/p-ball 10/10  Manual Therapy: PA mobs L5-2 Grade II 3x10 per segment  Prone press 10x f/b 10x with PT OP  [facility] Adult PT Treatment:  DATE: 05/24/23 Therapeutic Exercise: Nustep L2 8 min Prone prop 2 min QL stretch 30s x2 Piriformis stretch push 30s x2 Figure 4 bridge 15/15 Plank on knees 30s x2 PA mobs L5-2 Grade II 3x10 per segment                                                                                                                   DATE: 05/20/23 Eval    PATIENT EDUCATION:  Education details: Discussed eval findings, rehab rationale and POC and patient is in agreement  Person educated: Patient Education method: Explanation Education comprehension: verbalized understanding and needs further education  HOME EXERCISE PROGRAM: Access Code: [email or web address] URL: [email or web address] Date: 05/23/2023 Prepared by: Gustavus Bryant  Exercises - Sidelying Open Book Thoracic Lumbar Rotation and Extension  - 2 x daily - 5 x weekly - 1 sets - 10 reps - Supine Figure 4 Piriformis Stretch  - 2 x daily - 5 x weekly - 1 sets - 2 reps - 30s hold - Static Prone on Elbows  - 2 x daily - 5 x weekly - 1 sets - 1 reps - 2 min hold - Plank on Knees  - 1 x daily - 5 x weekly - 1 sets - 2 reps - 30s hold  ASSESSMENT:  CLINICAL IMPRESSION: Todays session addressed cervical discomfort and muscular tension in B UT regions.  Continued TPDN technique followed by aerobic work, postural retraining, lower cervical and upper thoracic mobilization tasks.  (Eval)Patient seen for cervical evaluation following traumatic fall 6-8 weeks ago resulting in acute on chronic upper back and cervical discomfort and loss of mobility.  AROM of  cervical spine demonstrates a muscular and soft tissue component vs a vertebrogenic component.  Patient demonstrates full AROM of BUEs however strength deficits identified in R RC due to history of undocumented pathology which she has been offered surgical correction but has declined.  Palpation finds TTP and taught irritable bands in B UT, levator and scalene groups.  Patient is a good candidate for OPPT to improve mobility and resolve soft tissue restriction and irritability.  (Eval)Patient is a 62 y.o. female who was seen today for physical therapy evaluation and treatment for chronic low back pain. Finding show full LE mobility, no nerve tension signs and strength deficits in trunk and BLEs.  Lumbar spring testing finds limited intersegmental mobility and subsequent loss of trunk ROM.  Patient would benefit from OPPT to address spinal stiffness and trunk/LE weakness.   OBJECTIVE IMPAIRMENTS: Abnormal gait, decreased activity tolerance, decreased balance, decreased knowledge of condition, decreased mobility, difficulty walking, decreased strength, hypomobility, impaired flexibility, improper body mechanics, and pain.   ACTIVITY LIMITATIONS: carrying, lifting, sitting, standing, squatting, and stairs  PERSONAL FACTORS: Age, Fitness, and Time since onset of injury/illness/exacerbation are also affecting patient's functional outcome.   REHAB POTENTIAL: Good  CLINICAL DECISION MAKING: Stable/uncomplicated  EVALUATION COMPLEXITY: Low   GOALS: Goals reviewed with patient? No  SHORT TERM GOALS: Target date: 06/10/2023  Patient to demonstrate independence in HEP  Baseline: [id number] Goal status: Met   LONG TERM GOALS: Target date: 10/03/2023    Decrease pain to 6/10 Baseline: 10/10 Goal status: INITIAL  2.  Increase trunk ROM to 75% Baseline:  AROM eval  Flexion 75%  Extension 50% P!  Right lateral flexion 50%  Left lateral flexion 50%  Right rotation   Left rotation    Goal  status: INITIAL  3.  Increase BLE strength to 4+/5 Baseline:  MMT Right eval Left eval  Hip flexion 4 4  Hip extension 4 4  Hip abduction 4 4  Hip adduction    Hip internal rotation    Hip external rotation    Knee flexion 4 4  Knee extension 4 4  Ankle dorsiflexion    Ankle plantarflexion 4 4   Goal status: INITIAL  4.  Obtain FOTO/ODI and determine functional level; 05/24/23 Goal is 10% Baseline: TBD; 05/24/23 13% Goal status: INITIAL  5.  Restore 75% cervical mobility in all deficit regions Baseline:  Active ROM A/PROM (deg) eval  Flexion 90%  Extension 90%  Right lateral flexion 75%  Left lateral flexion 50%  Right rotation 75%  Left rotation 50%   Goal status: INITIAL  6.   Decrease ODI score to 40% disability Baseline: 46% Goal status: INITIAL   PLAN:  PT FREQUENCY: 2x/week  PT DURATION: 6 weeks  PLANNED INTERVENTIONS: Therapeutic exercises, Therapeutic activity, Neuromuscular re-education, Balance training, Gait training, Patient/Family education, Self Care, Joint mobilization, Stair training, Aquatic Therapy, Dry Needling, Electrical stimulation, Spinal mobilization, Cryotherapy, Moist heat, Manual therapy, and Re-evaluation.  PLAN FOR NEXT SESSION: HEP review and update, manual techniques as appropriate, aerobic tasks, ROM and flexibility activities, strengthening and PREs, TPDN, gait and balance training as needed     Hildred Laser, PT 08/11/2023, 3:51 PM

## 2023-08-14 NOTE — Therapy (Unsigned)
OUTPATIENT PHYSICAL THERAPY TREATMENT NOTE   Patient Name: [name] MRN: [id number] DOB:1961-01-12, 62 y.o., female Today's Date: 08/15/2023  END OF SESSION:  PT End of Session - 08/15/23 1129     Visit Number 11    Number of Visits 13    Date for PT Re-Evaluation 10/03/23    Authorization Type [employer] MCR    PT Start Time 1130    PT Stop Time 1210    PT Time Calculation (min) 40 min    Activity Tolerance Patient tolerated treatment well    Behavior During Therapy Impulsive;WFL for tasks assessed/performed              Past Medical History:  Diagnosis Date   Arthritis    Bowel obstruction (HCC)    Bronchitis    CHF (congestive heart failure) (HCC)    Chronic systolic dysfunction of left ventricle    Diabetes mellitus without complication (HCC)    on oral meds   GERD (gastroesophageal reflux disease)    Headache(784.0)    migraines, hasn't had one for over a year   History of noncompliance with medical treatment    Hypercholesteremia    Hypertension    LVH (left ventricular hypertrophy)    Nonischemic cardiomyopathy (HCC)    Polysubstance abuse (HCC)    Tobacco abuse disorder    Past Surgical History:  Procedure Laterality Date   ABDOMINAL HYSTERECTOMY     ABDOMINAL SURGERY     left foot surgery     STRABISMUS SURGERY Left 07/31/2013   Procedure: REPAIR STRABISMUS;  Surgeon: Shara Blazing, MD;  Location: [facility] OR;  Service: Ophthalmology;  Laterality: Left;  EYE MUSCLE SURGERY LEFT EYE   UTERINE FIBROID SURGERY     Patient Active Problem List   Diagnosis Date Noted   CKD (chronic kidney disease) 06/28/2018   Chronic systolic CHF (congestive heart failure) (HCC) 06/28/2018   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 06/28/2018   Nephropathy associated with another disease 06/28/2018   Lumbago 06/28/2018   Vitamin D deficiency, unspecified 06/28/2018   Routine general medical examination at a health care facility 06/28/2018    Dry eye syndrome 06/28/2018   Imbalance 10/04/2017   Diabetes (HCC) 08/09/2016   Left acoustic neuroma (HCC) 10/03/2015   Essential hypertension 03/29/2012   Chronic systolic dysfunction of left ventricle 11/24/2011   ACS (acute coronary syndrome) (HCC) 11/23/2011   CHF, acute (HCC) 11/23/2011   Cocaine abuse (HCC) 11/23/2011   Tobacco abuse 11/23/2011   Pneumonitis, aspiration (HCC) 11/23/2011   Cardiomyopathy (HCC) 11/23/2011   HTN (hypertension), malignant 11/23/2011    PCP: Renaye Rakers, MD   REFERRING PROVIDER: Estanislado Spire PA  REFERRING DIAG: L4 radiculopathy, cervicalgia  Rationale for Evaluation and Treatment: Rehabilitation  THERAPY DIAG:  Other abnormalities of gait and mobility  Cervicalgia  Muscle weakness (generalized)  Other low back pain  ONSET DATE: chronic  SUBJECTIVE:  SUBJECTIVE STATEMENT: Main concern is R sided low back pain today.  Does not have f/u with referring MD  PERTINENT HISTORY:  None available for cervical region  PAIN:  Are you having pain? Yes: NPRS scale: 10/10 Pain location: low back  Pain description: ache Aggravating factors: twisting, sudden movements Relieving factors: position changes  Are you having pain? Yes: NPRS scale: 10/10 Pain location: neck and upper back Pain description: ache Aggravating factors: activity and prolonged positioning Relieving factors: position changes and rest  PRECAUTIONS: None  RED FLAGS: None   WEIGHT BEARING RESTRICTIONS: No  FALLS:  Has patient fallen in last 6 months? Yes. Number of falls 6+  OCCUPATION: part time house keeping  PLOF: Independent  PATIENT GOALS: To reduce and manage my back pain  NEXT MD VISIT: as needed  OBJECTIVE:   DIAGNOSTIC FINDINGS:  None recent  PATIENT SURVEYS:   ODI TBD; 05/24/23 13  NDI 08/03/23 23/50 46% perceived disability    MUSCLE LENGTH: Hamstrings: Right 90 deg; Left 90 deg PKB unremarkable B  POSTURE: decreased lumbar lordosis Rounded and elevated shoulders  PALPATION: Point tender over B greater trochanters and B gluteus medius 08/03/23 TTP of B UT, levators and scalenes  LUMBAR ROM:   AROM eval 07/13/23  Flexion 75% 75%  Extension 50% P! 50%  Right lateral flexion 50% 50%  Left lateral flexion 50% 50%  Right rotation  75%  Left rotation  75%   (Blank rows = not tested)  LOWER EXTREMITY ROM:   WNL B  Active  Right eval Left eval  Hip flexion    Hip extension    Hip abduction    Hip adduction    Hip internal rotation    Hip external rotation    Knee flexion    Knee extension    Ankle dorsiflexion    Ankle plantarflexion    Ankle inversion    Ankle eversion     (Blank rows = not tested)  LOWER EXTREMITY MMT:    MMT Right eval Left eval  Hip flexion 4 4  Hip extension 4 4  Hip abduction 4 4  Hip adduction    Hip internal rotation    Hip external rotation    Knee flexion 4 4  Knee extension 4 4  Ankle dorsiflexion    Ankle plantarflexion 4 4  Ankle inversion    Ankle eversion     (Blank rows = not tested) CERVICAL ROM:   Active ROM A/PROM (deg) eval  Flexion 90%  Extension 90%  Right lateral flexion 75%  Left lateral flexion 50%  Right rotation 75%  Left rotation 50%   UPPER EXTREMITY MMT:  MMT Right eval Left eval  Shoulder flexion 3+ 4  Shoulder extension 4 4  Shoulder abduction 3+ 4  Shoulder adduction    Shoulder extension    Shoulder internal rotation 4 4  Shoulder external rotation 3+ 4  Middle trapezius    Lower trapezius    Elbow flexion    Elbow extension    Wrist flexion    Wrist extension    Wrist ulnar deviation    Wrist radial deviation    Wrist pronation    Wrist supination    Grip strength     UPPER EXTREMITY ROM: WFL B   Active ROM Right eval Left eval   Shoulder flexion    Shoulder extension    Shoulder abduction    Shoulder adduction    Shoulder extension    Shoulder  internal rotation    Shoulder external rotation    Elbow flexion    Elbow extension    Wrist flexion    Wrist extension    Wrist ulnar deviation    Wrist radial deviation    Wrist pronation    Wrist supination     (Blank rows = not tested)  LUMBAR SPECIAL TESTS:  Straight leg raise test: Negative, Slump test: Negative, and Stork standing: Negative 07/13/23 Slump negative B  FUNCTIONAL TESTS:  5 times sit to stand: 9s arms crossed  GAIT: Distance walked: 46ftx2 Assistive device utilized: None Level of assistance: Complete Independence Comments: minimal trunk rotation  TODAY'S TREATMENT:     [facility] Adult PT Treatment:                                                DATE: 08/15/23 Therapeutic Exercise: Nustep L5 8 min Gait training focused on reciprocal arm swing and upper/lower trunk dissociation to promote trunk rotation Prone on elbows 2 min Prone press up 10x f/b 10x with PT OP QL stretch 30s B Open book 10/10 Manual Therapy: Skilled palpation to identify taught and irritable bands in R Iliocostalis Trigger Point Dry Needling Treatment: Pre-treatment instruction: Patient instructed on dry needling rationale, procedures, and possible side effects including pain during treatment (achy,cramping feeling), bruising, drop of blood, lightheadedness, nausea, sweating. Patient Consent Given: Yes Education handout provided: No Muscles treated: R Iliocostalis  Needle size and number: .30x45mm x 1 Electrical stimulation performed: No Parameters: N/A Treatment response/outcome: Twitch response elicited and Palpable decrease in muscle tension Post-treatment instructions: Patient instructed to expect possible mild to moderate muscle soreness later today and/or tomorrow. Patient instructed in methods to reduce muscle soreness and to continue prescribed HEP. If patient  was dry needled over the lung field, patient was instructed on signs and symptoms of pneumothorax and, however unlikely, to see immediate medical attention should they occur. Patient was also educated on signs and symptoms of infection and to seek medical attention should they occur. Patient verbalized understanding of these instructions and education.   [facility] Adult PT Treatment:                                                DATE: 08/11/23 Therapeutic Exercise: ER YTB 15x Supine Hor abd YTB 15x Supine chin tuck over towel roll 2s hold 10x2 B UT stretch 30s x2 Supine alternating shoulder flexion 1# 15/15 Chin tuck over towel 10x2 Manual Therapy: Skilled palpation to identify taught and irritable bands in B UT as well as paracervicals Trigger Point Dry Needling Treatment: Pre-treatment instruction: Patient instructed on dry needling rationale, procedures, and possible side effects including pain during treatment (achy,cramping feeling), bruising, drop of blood, lightheadedness, nausea, sweating. Patient Consent Given: Yes Education handout provided: No Muscles treated: B UT  Needle size and number: .25x11mm x 2 Electrical stimulation performed: No Parameters: N/A Treatment response/outcome: Twitch response elicited and Palpable decrease in muscle tension Post-treatment instructions: Patient instructed to expect possible mild to moderate muscle soreness later today and/or tomorrow. Patient instructed in methods to reduce muscle soreness and to continue prescribed HEP. If patient was dry needled over the lung field, patient was instructed on signs and symptoms of pneumothorax  and, however unlikely, to see immediate medical attention should they occur. Patient was also educated on signs and symptoms of infection and to seek medical attention should they occur. Patient verbalized understanding of these instructions and education.   [facility] Adult PT Treatment:                                                 DATE: 08/09/23 Therapeutic Exercise: Nustep L2 6 min QL stertch 30s x2 B Curl ups with p-ball 15x B, 15/15 unilaterally B UT stretch 30s x2 Manual Therapy: Skilled palpation to identify taught and irritable bands in B UT Trigger Point Dry Needling Treatment: Pre-treatment instruction: Patient instructed on dry needling rationale, procedures, and possible side effects including pain during treatment (achy,cramping feeling), bruising, drop of blood, lightheadedness, nausea, sweating. Patient Consent Given: Yes Education handout provided: No Muscles treated: B UT  Needle size and number: .25x76mm x 2 Electrical stimulation performed: No Parameters: N/A Treatment response/outcome: Twitch response elicited and Palpable decrease in muscle tension Post-treatment instructions: Patient instructed to expect possible mild to moderate muscle soreness later today and/or tomorrow. Patient instructed in methods to reduce muscle soreness and to continue prescribed HEP. If patient was dry needled over the lung field, patient was instructed on signs and symptoms of pneumothorax and, however unlikely, to see immediate medical attention should they occur. Patient was also educated on signs and symptoms of infection and to seek medical attention should they occur. Patient verbalized understanding of these instructions and education.       DATE: 05/20/23 Eval    PATIENT EDUCATION:  Education details: Discussed eval findings, rehab rationale and POC and patient is in agreement  Person educated: Patient Education method: Explanation Education comprehension: verbalized understanding and needs further education  HOME EXERCISE PROGRAM: Access Code: [email or web address] URL: [email or web address] Date: 05/23/2023 Prepared by: Gustavus Bryant  Exercises - Sidelying Open Book Thoracic Lumbar Rotation and Extension  - 2 x daily - 5 x weekly - 1 sets - 10 reps - Supine Figure 4 Piriformis Stretch  - 2 x daily  - 5 x weekly - 1 sets - 2 reps - 30s hold - Static Prone on Elbows  - 2 x daily - 5 x weekly - 1 sets - 1 reps - 2 min hold - Plank on Knees  - 1 x daily - 5 x weekly - 1 sets - 2 reps - 30s hold  ASSESSMENT:  CLINICAL IMPRESSION: Focus today shifted to lumbar spine and gait training to restore rotational component and resolve excess side bending moment in lumbar spine.  Continued TPDN to R low back, focus on R Iliocostalis.  Resumed prone press and open book to reinforce rotational components in lumbar spine.  Encouraged f/u with referring lumbar MD if symptoms persist.  (Eval)Patient seen for cervical evaluation following traumatic fall 6-8 weeks ago resulting in acute on chronic upper back and cervical discomfort and loss of mobility.  AROM of cervical spine demonstrates a muscular and soft tissue component vs a vertebrogenic component.  Patient demonstrates full AROM of BUEs however strength deficits identified in R RC due to history of undocumented pathology which she has been offered surgical correction but has declined.  Palpation finds TTP and taught irritable bands in B UT, levator and scalene groups.  Patient is a good candidate for OPPT to improve mobility and resolve  soft tissue restriction and irritability.  (Eval)Patient is a 62 y.o. female who was seen today for physical therapy evaluation and treatment for chronic low back pain. Finding show full LE mobility, no nerve tension signs and strength deficits in trunk and BLEs.  Lumbar spring testing finds limited intersegmental mobility and subsequent loss of trunk ROM.  Patient would benefit from OPPT to address spinal stiffness and trunk/LE weakness.   OBJECTIVE IMPAIRMENTS: Abnormal gait, decreased activity tolerance, decreased balance, decreased knowledge of condition, decreased mobility, difficulty walking, decreased strength, hypomobility, impaired flexibility, improper body mechanics, and pain.   ACTIVITY LIMITATIONS: carrying,  lifting, sitting, standing, squatting, and stairs  PERSONAL FACTORS: Age, Fitness, and Time since onset of injury/illness/exacerbation are also affecting patient's functional outcome.   REHAB POTENTIAL: Good  CLINICAL DECISION MAKING: Stable/uncomplicated  EVALUATION COMPLEXITY: Low   GOALS: Goals reviewed with patient? No  SHORT TERM GOALS: Target date: 06/10/2023  Patient to demonstrate independence in HEP  Baseline: [id number] Goal status: Met   LONG TERM GOALS: Target date: 10/03/2023    Decrease pain to 6/10 Baseline: 10/10 Goal status: INITIAL  2.  Increase trunk ROM to 75% Baseline:  AROM eval  Flexion 75%  Extension 50% P!  Right lateral flexion 50%  Left lateral flexion 50%  Right rotation   Left rotation    Goal status: INITIAL  3.  Increase BLE strength to 4+/5 Baseline:  MMT Right eval Left eval  Hip flexion 4 4  Hip extension 4 4  Hip abduction 4 4  Hip adduction    Hip internal rotation    Hip external rotation    Knee flexion 4 4  Knee extension 4 4  Ankle dorsiflexion    Ankle plantarflexion 4 4   Goal status: INITIAL  4.  Obtain FOTO/ODI and determine functional level; 05/24/23 Goal is 10% Baseline: TBD; 05/24/23 13% Goal status: INITIAL  5.  Restore 75% cervical mobility in all deficit regions Baseline:  Active ROM A/PROM (deg) eval  Flexion 90%  Extension 90%  Right lateral flexion 75%  Left lateral flexion 50%  Right rotation 75%  Left rotation 50%   Goal status: INITIAL  6.   Decrease ODI score to 40% disability Baseline: 46% Goal status: INITIAL   PLAN:  PT FREQUENCY: 2x/week  PT DURATION: 6 weeks  PLANNED INTERVENTIONS: Therapeutic exercises, Therapeutic activity, Neuromuscular re-education, Balance training, Gait training, Patient/Family education, Self Care, Joint mobilization, Stair training, Aquatic Therapy, Dry Needling, Electrical stimulation, Spinal mobilization, Cryotherapy, Moist heat, Manual therapy, and  Re-evaluation.  PLAN FOR NEXT SESSION: HEP review and update, manual techniques as appropriate, aerobic tasks, ROM and flexibility activities, strengthening and PREs, TPDN, gait and balance training as needed     Hildred Laser, PT 08/15/2023, 12:19 PM

## 2023-08-15 ENCOUNTER — Ambulatory Visit: Payer: 59

## 2023-08-15 DIAGNOSIS — M6281 Muscle weakness (generalized): Secondary | ICD-10-CM

## 2023-08-15 DIAGNOSIS — M542 Cervicalgia: Secondary | ICD-10-CM | POA: Diagnosis not present

## 2023-08-15 DIAGNOSIS — M5459 Other low back pain: Secondary | ICD-10-CM | POA: Diagnosis not present

## 2023-08-15 DIAGNOSIS — R2689 Other abnormalities of gait and mobility: Secondary | ICD-10-CM | POA: Diagnosis not present

## 2023-08-15 NOTE — Therapy (Unsigned)
OUTPATIENT PHYSICAL THERAPY TREATMENT NOTE   Patient Name: [name] MRN: [id number] DOB:05-27-1961, 62 y.o., female Today's Date: 08/17/2023  END OF SESSION:  PT End of Session - 08/17/23 1701     Visit Number 12    Number of Visits 13    Date for PT Re-Evaluation 10/03/23    Authorization Type [employer] MCR    PT Start Time 1700    PT Stop Time 1740    PT Time Calculation (min) 40 min    Activity Tolerance Patient tolerated treatment well    Behavior During Therapy Impulsive;WFL for tasks assessed/performed               Past Medical History:  Diagnosis Date   Arthritis    Bowel obstruction (HCC)    Bronchitis    CHF (congestive heart failure) (HCC)    Chronic systolic dysfunction of left ventricle    Diabetes mellitus without complication (HCC)    on oral meds   GERD (gastroesophageal reflux disease)    Headache(784.0)    migraines, hasn't had one for over a year   History of noncompliance with medical treatment    Hypercholesteremia    Hypertension    LVH (left ventricular hypertrophy)    Nonischemic cardiomyopathy (HCC)    Polysubstance abuse (HCC)    Tobacco abuse disorder    Past Surgical History:  Procedure Laterality Date   ABDOMINAL HYSTERECTOMY     ABDOMINAL SURGERY     left foot surgery     STRABISMUS SURGERY Left 07/31/2013   Procedure: REPAIR STRABISMUS;  Surgeon: Shara Blazing, MD;  Location: [facility] OR;  Service: Ophthalmology;  Laterality: Left;  EYE MUSCLE SURGERY LEFT EYE   UTERINE FIBROID SURGERY     Patient Active Problem List   Diagnosis Date Noted   CKD (chronic kidney disease) 06/28/2018   Chronic systolic CHF (congestive heart failure) (HCC) 06/28/2018   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 06/28/2018   Nephropathy associated with another disease 06/28/2018   Lumbago 06/28/2018   Vitamin D deficiency, unspecified 06/28/2018   Routine general medical examination at a health care facility 06/28/2018    Dry eye syndrome 06/28/2018   Imbalance 10/04/2017   Diabetes (HCC) 08/09/2016   Left acoustic neuroma (HCC) 10/03/2015   Essential hypertension 03/29/2012   Chronic systolic dysfunction of left ventricle 11/24/2011   ACS (acute coronary syndrome) (HCC) 11/23/2011   CHF, acute (HCC) 11/23/2011   Cocaine abuse (HCC) 11/23/2011   Tobacco abuse 11/23/2011   Pneumonitis, aspiration (HCC) 11/23/2011   Cardiomyopathy (HCC) 11/23/2011   HTN (hypertension), malignant 11/23/2011    PCP: Renaye Rakers, MD   REFERRING PROVIDER: Estanislado Spire PA  REFERRING DIAG: L4 radiculopathy, cervicalgia  Rationale for Evaluation and Treatment: Rehabilitation  THERAPY DIAG:  Other abnormalities of gait and mobility  Cervicalgia  Muscle weakness (generalized)  Other low back pain  ONSET DATE: chronic  SUBJECTIVE:  SUBJECTIVE STATEMENT: Doing well and feels trunk extension exercises have been beneficial.  Feels she can forgo TPDN today and focus on exercises including core strength and flexibility.  PERTINENT HISTORY:  None available for cervical region  PAIN:  Are you having pain? Yes: NPRS scale: 10/10 Pain location: low back  Pain description: ache Aggravating factors: twisting, sudden movements Relieving factors: position changes  Are you having pain? Yes: NPRS scale: 10/10 Pain location: neck and upper back Pain description: ache Aggravating factors: activity and prolonged positioning Relieving factors: position changes and rest  PRECAUTIONS: None  RED FLAGS: None   WEIGHT BEARING RESTRICTIONS: No  FALLS:  Has patient fallen in last 6 months? Yes. Number of falls 6+  OCCUPATION: part time house keeping  PLOF: Independent  PATIENT GOALS: To reduce and manage my back pain  NEXT MD VISIT: as  needed  OBJECTIVE:   DIAGNOSTIC FINDINGS:  None recent  PATIENT SURVEYS:  ODI TBD; 05/24/23 13  NDI 08/03/23 23/50 46% perceived disability    MUSCLE LENGTH: Hamstrings: Right 90 deg; Left 90 deg PKB unremarkable B  POSTURE: decreased lumbar lordosis Rounded and elevated shoulders  PALPATION: Point tender over B greater trochanters and B gluteus medius 08/03/23 TTP of B UT, levators and scalenes  LUMBAR ROM:   AROM eval 07/13/23  Flexion 75% 75%  Extension 50% P! 50%  Right lateral flexion 50% 50%  Left lateral flexion 50% 50%  Right rotation  75%  Left rotation  75%   (Blank rows = not tested)  LOWER EXTREMITY ROM:   WNL B  Active  Right eval Left eval  Hip flexion    Hip extension    Hip abduction    Hip adduction    Hip internal rotation    Hip external rotation    Knee flexion    Knee extension    Ankle dorsiflexion    Ankle plantarflexion    Ankle inversion    Ankle eversion     (Blank rows = not tested)  LOWER EXTREMITY MMT:    MMT Right eval Left eval  Hip flexion 4 4  Hip extension 4 4  Hip abduction 4 4  Hip adduction    Hip internal rotation    Hip external rotation    Knee flexion 4 4  Knee extension 4 4  Ankle dorsiflexion    Ankle plantarflexion 4 4  Ankle inversion    Ankle eversion     (Blank rows = not tested) CERVICAL ROM:   Active ROM A/PROM (deg) eval  Flexion 90%  Extension 90%  Right lateral flexion 75%  Left lateral flexion 50%  Right rotation 75%  Left rotation 50%   UPPER EXTREMITY MMT:  MMT Right eval Left eval  Shoulder flexion 3+ 4  Shoulder extension 4 4  Shoulder abduction 3+ 4  Shoulder adduction    Shoulder extension    Shoulder internal rotation 4 4  Shoulder external rotation 3+ 4  Middle trapezius    Lower trapezius    Elbow flexion    Elbow extension    Wrist flexion    Wrist extension    Wrist ulnar deviation    Wrist radial deviation    Wrist pronation    Wrist supination    Grip  strength     UPPER EXTREMITY ROM: WFL B   Active ROM Right eval Left eval  Shoulder flexion    Shoulder extension    Shoulder abduction    Shoulder adduction  Shoulder extension    Shoulder internal rotation    Shoulder external rotation    Elbow flexion    Elbow extension    Wrist flexion    Wrist extension    Wrist ulnar deviation    Wrist radial deviation    Wrist pronation    Wrist supination     (Blank rows = not tested)  LUMBAR SPECIAL TESTS:  Straight leg raise test: Negative, Slump test: Negative, and Stork standing: Negative 07/13/23 Slump negative B  FUNCTIONAL TESTS:  5 times sit to stand: 9s arms crossed  GAIT: Distance walked: 73ftx2 Assistive device utilized: None Level of assistance: Complete Independence Comments: minimal trunk rotation  TODAY'S TREATMENT:    [facility] Adult PT Treatment:                                                DATE: 08/17/23 Therapeutic Exercise: Nustep L6 8 min Prone on elbows 2 min Prone press up 10x f/b 10x with PT OP QL stretch 30s x2 B Open book 10/10 focus on breathing patterns Curl ups with p-ball 15x B, 15/15 unilaterally Supine horizontal abduction YTB 15x  [facility] Adult PT Treatment:                                                DATE: 08/15/23 Therapeutic Exercise: Nustep L5 8 min Gait training focused on reciprocal arm swing and upper/lower trunk dissociation to promote trunk rotation Prone on elbows 2 min Prone press up 10x f/b 10x with PT OP QL stretch 30s B Open book 10/10 Manual Therapy: Skilled palpation to identify taught and irritable bands in R Iliocostalis Trigger Point Dry Needling Treatment: Pre-treatment instruction: Patient instructed on dry needling rationale, procedures, and possible side effects including pain during treatment (achy,cramping feeling), bruising, drop of blood, lightheadedness, nausea, sweating. Patient Consent Given: Yes Education handout provided: No Muscles treated: R  Iliocostalis  Needle size and number: .30x56mm x 1 Electrical stimulation performed: No Parameters: N/A Treatment response/outcome: Twitch response elicited and Palpable decrease in muscle tension Post-treatment instructions: Patient instructed to expect possible mild to moderate muscle soreness later today and/or tomorrow. Patient instructed in methods to reduce muscle soreness and to continue prescribed HEP. If patient was dry needled over the lung field, patient was instructed on signs and symptoms of pneumothorax and, however unlikely, to see immediate medical attention should they occur. Patient was also educated on signs and symptoms of infection and to seek medical attention should they occur. Patient verbalized understanding of these instructions and education.   [facility] Adult PT Treatment:                                                DATE: 08/11/23 Therapeutic Exercise: ER YTB 15x Supine Hor abd YTB 15x Supine chin tuck over towel roll 2s hold 10x2 B UT stretch 30s x2 Supine alternating shoulder flexion 1# 15/15 Chin tuck over towel 10x2 Manual Therapy: Skilled palpation to identify taught and irritable bands in B UT as well as paracervicals Trigger Point Dry Needling Treatment: Pre-treatment instruction: Patient instructed on dry  needling rationale, procedures, and possible side effects including pain during treatment (achy,cramping feeling), bruising, drop of blood, lightheadedness, nausea, sweating. Patient Consent Given: Yes Education handout provided: No Muscles treated: B UT  Needle size and number: .25x5mm x 2 Electrical stimulation performed: No Parameters: N/A Treatment response/outcome: Twitch response elicited and Palpable decrease in muscle tension Post-treatment instructions: Patient instructed to expect possible mild to moderate muscle soreness later today and/or tomorrow. Patient instructed in methods to reduce muscle soreness and to continue prescribed HEP. If  patient was dry needled over the lung field, patient was instructed on signs and symptoms of pneumothorax and, however unlikely, to see immediate medical attention should they occur. Patient was also educated on signs and symptoms of infection and to seek medical attention should they occur. Patient verbalized understanding of these instructions and education.   [facility] Adult PT Treatment:                                                DATE: 08/09/23 Therapeutic Exercise: Nustep L2 6 min QL stertch 30s x2 B Curl ups with p-ball 15x B, 15/15 unilaterally B UT stretch 30s x2 Manual Therapy: Skilled palpation to identify taught and irritable bands in B UT Trigger Point Dry Needling Treatment: Pre-treatment instruction: Patient instructed on dry needling rationale, procedures, and possible side effects including pain during treatment (achy,cramping feeling), bruising, drop of blood, lightheadedness, nausea, sweating. Patient Consent Given: Yes Education handout provided: No Muscles treated: B UT  Needle size and number: .25x11mm x 2 Electrical stimulation performed: No Parameters: N/A Treatment response/outcome: Twitch response elicited and Palpable decrease in muscle tension Post-treatment instructions: Patient instructed to expect possible mild to moderate muscle soreness later today and/or tomorrow. Patient instructed in methods to reduce muscle soreness and to continue prescribed HEP. If patient was dry needled over the lung field, patient was instructed on signs and symptoms of pneumothorax and, however unlikely, to see immediate medical attention should they occur. Patient was also educated on signs and symptoms of infection and to seek medical attention should they occur. Patient verbalized understanding of these instructions and education.       DATE: 05/20/23 Eval    PATIENT EDUCATION:  Education details: Discussed eval findings, rehab rationale and POC and patient is in agreement  Person  educated: Patient Education method: Explanation Education comprehension: verbalized understanding and needs further education  HOME EXERCISE PROGRAM: Access Code: [email or web address] URL: [email or web address] Date: 05/23/2023 Prepared by: Gustavus Bryant  Exercises - Sidelying Open Book Thoracic Lumbar Rotation and Extension  - 2 x daily - 5 x weekly - 1 sets - 10 reps - Supine Figure 4 Piriformis Stretch  - 2 x daily - 5 x weekly - 1 sets - 2 reps - 30s hold - Static Prone on Elbows  - 2 x daily - 5 x weekly - 1 sets - 1 reps - 2 min hold - Plank on Knees  - 1 x daily - 5 x weekly - 1 sets - 2 reps - 30s hold  ASSESSMENT:  CLINICAL IMPRESSION: Session focused on stretching, postural retraining, ROM and core training.  R shoulder RC pathology limits ability to advance some tasks and activity modified to accommodate.    (Eval)Patient seen for cervical evaluation following traumatic fall 6-8 weeks ago resulting in acute on chronic upper back and cervical discomfort and loss of  mobility.  AROM of cervical spine demonstrates a muscular and soft tissue component vs a vertebrogenic component.  Patient demonstrates full AROM of BUEs however strength deficits identified in R RC due to history of undocumented pathology which she has been offered surgical correction but has declined.  Palpation finds TTP and taught irritable bands in B UT, levator and scalene groups.  Patient is a good candidate for OPPT to improve mobility and resolve soft tissue restriction and irritability.  (Eval)Patient is a 62 y.o. female who was seen today for physical therapy evaluation and treatment for chronic low back pain. Finding show full LE mobility, no nerve tension signs and strength deficits in trunk and BLEs.  Lumbar spring testing finds limited intersegmental mobility and subsequent loss of trunk ROM.  Patient would benefit from OPPT to address spinal stiffness and trunk/LE weakness.   OBJECTIVE IMPAIRMENTS:  Abnormal gait, decreased activity tolerance, decreased balance, decreased knowledge of condition, decreased mobility, difficulty walking, decreased strength, hypomobility, impaired flexibility, improper body mechanics, and pain.   ACTIVITY LIMITATIONS: carrying, lifting, sitting, standing, squatting, and stairs  PERSONAL FACTORS: Age, Fitness, and Time since onset of injury/illness/exacerbation are also affecting patient's functional outcome.   REHAB POTENTIAL: Good  CLINICAL DECISION MAKING: Stable/uncomplicated  EVALUATION COMPLEXITY: Low   GOALS: Goals reviewed with patient? No  SHORT TERM GOALS: Target date: 06/10/2023  Patient to demonstrate independence in HEP  Baseline: [id number] Goal status: Met   LONG TERM GOALS: Target date: 10/03/2023    Decrease pain to 6/10 Baseline: 10/10 Goal status: INITIAL  2.  Increase trunk ROM to 75% Baseline:  AROM eval  Flexion 75%  Extension 50% P!  Right lateral flexion 50%  Left lateral flexion 50%  Right rotation   Left rotation    Goal status: INITIAL  3.  Increase BLE strength to 4+/5 Baseline:  MMT Right eval Left eval  Hip flexion 4 4  Hip extension 4 4  Hip abduction 4 4  Hip adduction    Hip internal rotation    Hip external rotation    Knee flexion 4 4  Knee extension 4 4  Ankle dorsiflexion    Ankle plantarflexion 4 4   Goal status: INITIAL  4.  Obtain FOTO/ODI and determine functional level; 05/24/23 Goal is 10% Baseline: TBD; 05/24/23 13% Goal status: INITIAL  5.  Restore 75% cervical mobility in all deficit regions Baseline:  Active ROM A/PROM (deg) eval  Flexion 90%  Extension 90%  Right lateral flexion 75%  Left lateral flexion 50%  Right rotation 75%  Left rotation 50%   Goal status: INITIAL  6.   Decrease ODI score to 40% disability Baseline: 46% Goal status: INITIAL   PLAN:  PT FREQUENCY: 2x/week  PT DURATION: 6 weeks  PLANNED INTERVENTIONS: Therapeutic exercises, Therapeutic  activity, Neuromuscular re-education, Balance training, Gait training, Patient/Family education, Self Care, Joint mobilization, Stair training, Aquatic Therapy, Dry Needling, Electrical stimulation, Spinal mobilization, Cryotherapy, Moist heat, Manual therapy, and Re-evaluation.  PLAN FOR NEXT SESSION: HEP review and update, manual techniques as appropriate, aerobic tasks, ROM and flexibility activities, strengthening and PREs, TPDN, gait and balance training as needed     Hildred Laser, PT 08/17/2023, 5:46 PM

## 2023-08-17 ENCOUNTER — Ambulatory Visit: Payer: 59

## 2023-08-17 DIAGNOSIS — M6281 Muscle weakness (generalized): Secondary | ICD-10-CM | POA: Diagnosis not present

## 2023-08-17 DIAGNOSIS — M5459 Other low back pain: Secondary | ICD-10-CM

## 2023-08-17 DIAGNOSIS — M542 Cervicalgia: Secondary | ICD-10-CM | POA: Diagnosis not present

## 2023-08-17 DIAGNOSIS — R2689 Other abnormalities of gait and mobility: Secondary | ICD-10-CM

## 2023-08-18 DIAGNOSIS — M25511 Pain in right shoulder: Secondary | ICD-10-CM | POA: Diagnosis not present

## 2023-08-18 DIAGNOSIS — M545 Low back pain, unspecified: Secondary | ICD-10-CM | POA: Diagnosis not present

## 2023-08-19 NOTE — Therapy (Unsigned)
OUTPATIENT PHYSICAL THERAPY TREATMENT NOTE   Patient Name: [name] MRN: [id number] DOB:08-28-1961, 62 y.o., female Today's Date: 08/23/2023  END OF SESSION:  PT End of Session - 08/23/23 1703     Visit Number 13    Number of Visits 24    Date for PT Re-Evaluation 10/03/23    Authorization Type [employer] MCR    PT Start Time 1700    PT Stop Time 1745    PT Time Calculation (min) 45 min    Activity Tolerance Patient tolerated treatment well    Behavior During Therapy Impulsive;WFL for tasks assessed/performed                Past Medical History:  Diagnosis Date   Arthritis    Bowel obstruction (HCC)    Bronchitis    CHF (congestive heart failure) (HCC)    Chronic systolic dysfunction of left ventricle    Diabetes mellitus without complication (HCC)    on oral meds   GERD (gastroesophageal reflux disease)    Headache(784.0)    migraines, hasn't had one for over a year   History of noncompliance with medical treatment    Hypercholesteremia    Hypertension    LVH (left ventricular hypertrophy)    Nonischemic cardiomyopathy (HCC)    Polysubstance abuse (HCC)    Tobacco abuse disorder    Past Surgical History:  Procedure Laterality Date   ABDOMINAL HYSTERECTOMY     ABDOMINAL SURGERY     left foot surgery     STRABISMUS SURGERY Left 07/31/2013   Procedure: REPAIR STRABISMUS;  Surgeon: Shara Blazing, MD;  Location: [facility] OR;  Service: Ophthalmology;  Laterality: Left;  EYE MUSCLE SURGERY LEFT EYE   UTERINE FIBROID SURGERY     Patient Active Problem List   Diagnosis Date Noted   CKD (chronic kidney disease) 06/28/2018   Chronic systolic CHF (congestive heart failure) (HCC) 06/28/2018   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 06/28/2018   Nephropathy associated with another disease 06/28/2018   Lumbago 06/28/2018   Vitamin D deficiency, unspecified 06/28/2018   Routine general medical examination at a health care facility 06/28/2018    Dry eye syndrome 06/28/2018   Imbalance 10/04/2017   Diabetes (HCC) 08/09/2016   Left acoustic neuroma (HCC) 10/03/2015   Essential hypertension 03/29/2012   Chronic systolic dysfunction of left ventricle 11/24/2011   ACS (acute coronary syndrome) (HCC) 11/23/2011   CHF, acute (HCC) 11/23/2011   Cocaine abuse (HCC) 11/23/2011   Tobacco abuse 11/23/2011   Pneumonitis, aspiration (HCC) 11/23/2011   Cardiomyopathy (HCC) 11/23/2011   HTN (hypertension), malignant 11/23/2011    PCP: Renaye Rakers, MD   REFERRING PROVIDER: Estanislado Spire PA  REFERRING DIAG: L4 radiculopathy, cervicalgia  Rationale for Evaluation and Treatment: Rehabilitation  THERAPY DIAG:  Other abnormalities of gait and mobility  Cervicalgia  Muscle weakness (generalized)  Other low back pain  ONSET DATE: chronic  SUBJECTIVE:  SUBJECTIVE STATEMENT: Arrives with symptoms of discomfort in L axilla region.  Has been diagnosed with swollen lymph nodes.  Has been scheduled for ESI to lumbar region 08/29/23  PERTINENT HISTORY:  None available for cervical region  PAIN:  Are you having pain? Yes: NPRS scale: 10/10 Pain location: low back  Pain description: ache Aggravating factors: twisting, sudden movements Relieving factors: position changes  Are you having pain? Yes: NPRS scale: 10/10 Pain location: neck and upper back Pain description: ache Aggravating factors: activity and prolonged positioning Relieving factors: position changes and rest  PRECAUTIONS: None  RED FLAGS: None   WEIGHT BEARING RESTRICTIONS: No  FALLS:  Has patient fallen in last 6 months? Yes. Number of falls 6+  OCCUPATION: part time house keeping  PLOF: Independent  PATIENT GOALS: To reduce and manage my back pain  NEXT MD VISIT: as  needed  OBJECTIVE:   DIAGNOSTIC FINDINGS:  None recent  PATIENT SURVEYS:  ODI TBD; 05/24/23 13; 08/23/23 10/50 20% perceived disability NDI 08/03/23 23/50 46% perceived disability    MUSCLE LENGTH: Hamstrings: Right 90 deg; Left 90 deg PKB unremarkable B  POSTURE: decreased lumbar lordosis Rounded and elevated shoulders  PALPATION: Point tender over B greater trochanters and B gluteus medius 08/03/23 TTP of B UT, levators and scalenes  LUMBAR ROM:   AROM eval 07/13/23  Flexion 75% 75%  Extension 50% P! 50%  Right lateral flexion 50% 50%  Left lateral flexion 50% 50%  Right rotation  75%  Left rotation  75%   (Blank rows = not tested)  LOWER EXTREMITY ROM:   WNL B  Active  Right eval Left eval  Hip flexion    Hip extension    Hip abduction    Hip adduction    Hip internal rotation    Hip external rotation    Knee flexion    Knee extension    Ankle dorsiflexion    Ankle plantarflexion    Ankle inversion    Ankle eversion     (Blank rows = not tested)  LOWER EXTREMITY MMT:    MMT Right eval Left eval  Hip flexion 4 4  Hip extension 4 4  Hip abduction 4 4  Hip adduction    Hip internal rotation    Hip external rotation    Knee flexion 4 4  Knee extension 4 4  Ankle dorsiflexion    Ankle plantarflexion 4 4  Ankle inversion    Ankle eversion     (Blank rows = not tested) CERVICAL ROM:   Active ROM A/PROM (deg) eval  Flexion 90%  Extension 90%  Right lateral flexion 75%  Left lateral flexion 50%  Right rotation 75%  Left rotation 50%   UPPER EXTREMITY MMT:  MMT Right eval Left eval  Shoulder flexion 3+ 4  Shoulder extension 4 4  Shoulder abduction 3+ 4  Shoulder adduction    Shoulder extension    Shoulder internal rotation 4 4  Shoulder external rotation 3+ 4  Middle trapezius    Lower trapezius    Elbow flexion    Elbow extension    Wrist flexion    Wrist extension    Wrist ulnar deviation    Wrist radial deviation    Wrist  pronation    Wrist supination    Grip strength     UPPER EXTREMITY ROM: WFL B   Active ROM Right eval Left eval  Shoulder flexion    Shoulder extension    Shoulder abduction  Shoulder adduction    Shoulder extension    Shoulder internal rotation    Shoulder external rotation    Elbow flexion    Elbow extension    Wrist flexion    Wrist extension    Wrist ulnar deviation    Wrist radial deviation    Wrist pronation    Wrist supination     (Blank rows = not tested)  LUMBAR SPECIAL TESTS:  Straight leg raise test: Negative, Slump test: Negative, and Stork standing: Negative 07/13/23 Slump negative B  FUNCTIONAL TESTS:  5 times sit to stand: 9s arms crossed  GAIT: Distance walked: 19ftx2 Assistive device utilized: None Level of assistance: Complete Independence Comments: minimal trunk rotation  TODAY'S TREATMENT:    [facility] Adult PT Treatment:                                                DATE: 08/23/23 Therapeutic Exercise: Nustep L6 8 min Seated hamstring stretch 30s x2 B Lumbar extension over countertop 10x Prone on elbows 2 min QL stretch 30s x2 B Open book 10/10 focus on breathing patterns(limited by L shoulder pain) Curl ups with p-ball 15x B, 15/15 unilaterally  [facility] Adult PT Treatment:                                                DATE: 08/17/23 Therapeutic Exercise: Nustep L6 8 min Prone on elbows 2 min Prone press up 10x f/b 10x with PT OP QL stretch 30s x2 B Open book 10/10 focus on breathing patterns Curl ups with p-ball 15x B, 15/15 unilaterally Supine horizontal abduction YTB 15x  [facility] Adult PT Treatment:                                                DATE: 08/15/23 Therapeutic Exercise: Nustep L5 8 min Gait training focused on reciprocal arm swing and upper/lower trunk dissociation to promote trunk rotation Prone on elbows 2 min Prone press up 10x f/b 10x with PT OP QL stretch 30s B Open book 10/10 Manual Therapy: Skilled palpation to  identify taught and irritable bands in R Iliocostalis Trigger Point Dry Needling Treatment: Pre-treatment instruction: Patient instructed on dry needling rationale, procedures, and possible side effects including pain during treatment (achy,cramping feeling), bruising, drop of blood, lightheadedness, nausea, sweating. Patient Consent Given: Yes Education handout provided: No Muscles treated: R Iliocostalis  Needle size and number: .30x44mm x 1 Electrical stimulation performed: No Parameters: N/A Treatment response/outcome: Twitch response elicited and Palpable decrease in muscle tension Post-treatment instructions: Patient instructed to expect possible mild to moderate muscle soreness later today and/or tomorrow. Patient instructed in methods to reduce muscle soreness and to continue prescribed HEP. If patient was dry needled over the lung field, patient was instructed on signs and symptoms of pneumothorax and, however unlikely, to see immediate medical attention should they occur. Patient was also educated on signs and symptoms of infection and to seek medical attention should they occur. Patient verbalized understanding of these instructions and education.   [facility] Adult PT Treatment:  DATE: 08/11/23 Therapeutic Exercise: ER YTB 15x Supine Hor abd YTB 15x Supine chin tuck over towel roll 2s hold 10x2 B UT stretch 30s x2 Supine alternating shoulder flexion 1# 15/15 Chin tuck over towel 10x2 Manual Therapy: Skilled palpation to identify taught and irritable bands in B UT as well as paracervicals Trigger Point Dry Needling Treatment: Pre-treatment instruction: Patient instructed on dry needling rationale, procedures, and possible side effects including pain during treatment (achy,cramping feeling), bruising, drop of blood, lightheadedness, nausea, sweating. Patient Consent Given: Yes Education handout provided: No Muscles treated: B UT  Needle  size and number: .25x57mm x 2 Electrical stimulation performed: No Parameters: N/A Treatment response/outcome: Twitch response elicited and Palpable decrease in muscle tension Post-treatment instructions: Patient instructed to expect possible mild to moderate muscle soreness later today and/or tomorrow. Patient instructed in methods to reduce muscle soreness and to continue prescribed HEP. If patient was dry needled over the lung field, patient was instructed on signs and symptoms of pneumothorax and, however unlikely, to see immediate medical attention should they occur. Patient was also educated on signs and symptoms of infection and to seek medical attention should they occur. Patient verbalized understanding of these instructions and education.   [facility] Adult PT Treatment:                                                DATE: 08/09/23 Therapeutic Exercise: Nustep L2 6 min QL stertch 30s x2 B Curl ups with p-ball 15x B, 15/15 unilaterally B UT stretch 30s x2 Manual Therapy: Skilled palpation to identify taught and irritable bands in B UT Trigger Point Dry Needling Treatment: Pre-treatment instruction: Patient instructed on dry needling rationale, procedures, and possible side effects including pain during treatment (achy,cramping feeling), bruising, drop of blood, lightheadedness, nausea, sweating. Patient Consent Given: Yes Education handout provided: No Muscles treated: B UT  Needle size and number: .25x35mm x 2 Electrical stimulation performed: No Parameters: N/A Treatment response/outcome: Twitch response elicited and Palpable decrease in muscle tension Post-treatment instructions: Patient instructed to expect possible mild to moderate muscle soreness later today and/or tomorrow. Patient instructed in methods to reduce muscle soreness and to continue prescribed HEP. If patient was dry needled over the lung field, patient was instructed on signs and symptoms of pneumothorax and, however  unlikely, to see immediate medical attention should they occur. Patient was also educated on signs and symptoms of infection and to seek medical attention should they occur. Patient verbalized understanding of these instructions and education.       DATE: 05/20/23 Eval    PATIENT EDUCATION:  Education details: Discussed eval findings, rehab rationale and POC and patient is in agreement  Person educated: Patient Education method: Explanation Education comprehension: verbalized understanding and needs further education  HOME EXERCISE PROGRAM: Access Code: [email or web address] URL: [email or web address] Date: 05/23/2023 Prepared by: Gustavus Bryant  Exercises - Sidelying Open Book Thoracic Lumbar Rotation and Extension  - 2 x daily - 5 x weekly - 1 sets - 10 reps - Supine Figure 4 Piriformis Stretch  - 2 x daily - 5 x weekly - 1 sets - 2 reps - 30s hold - Static Prone on Elbows  - 2 x daily - 5 x weekly - 1 sets - 1 reps - 2 min hold - Plank on Knees  - 1 x daily - 5 x weekly - 1  sets - 2 reps - 30s hold  ASSESSMENT:  CLINICAL IMPRESSION: Session focused on stretching, postural retraining, ROM and core training.  R shoulder RC pathology limits ability to advance some tasks and activity modified to accommodate.  Unable to perform prone press or open book due to axilla pain and discomfort.  ODI score shows 20% perceived disability down from 43%  (Eval)Patient seen for cervical evaluation following traumatic fall 6-8 weeks ago resulting in acute on chronic upper back and cervical discomfort and loss of mobility.  AROM of cervical spine demonstrates a muscular and soft tissue component vs a vertebrogenic component.  Patient demonstrates full AROM of BUEs however strength deficits identified in R RC due to history of undocumented pathology which she has been offered surgical correction but has declined.  Palpation finds TTP and taught irritable bands in B UT, levator and scalene groups.  Patient  is a good candidate for OPPT to improve mobility and resolve soft tissue restriction and irritability.  (Eval)Patient is a 62 y.o. female who was seen today for physical therapy evaluation and treatment for chronic low back pain. Finding show full LE mobility, no nerve tension signs and strength deficits in trunk and BLEs.  Lumbar spring testing finds limited intersegmental mobility and subsequent loss of trunk ROM.  Patient would benefit from OPPT to address spinal stiffness and trunk/LE weakness.   OBJECTIVE IMPAIRMENTS: Abnormal gait, decreased activity tolerance, decreased balance, decreased knowledge of condition, decreased mobility, difficulty walking, decreased strength, hypomobility, impaired flexibility, improper body mechanics, and pain.   ACTIVITY LIMITATIONS: carrying, lifting, sitting, standing, squatting, and stairs  PERSONAL FACTORS: Age, Fitness, and Time since onset of injury/illness/exacerbation are also affecting patient's functional outcome.   REHAB POTENTIAL: Good  CLINICAL DECISION MAKING: Stable/uncomplicated  EVALUATION COMPLEXITY: Low   GOALS: Goals reviewed with patient? No  SHORT TERM GOALS: Target date: 06/10/2023  Patient to demonstrate independence in HEP  Baseline: [id number] Goal status: Met   LONG TERM GOALS: Target date: 10/03/2023    Decrease pain to 6/10 Baseline: 10/10 Goal status: INITIAL  2.  Increase trunk ROM to 75% Baseline:  AROM eval  Flexion 75%  Extension 50% P!  Right lateral flexion 50%  Left lateral flexion 50%  Right rotation   Left rotation    Goal status: INITIAL  3.  Increase BLE strength to 4+/5 Baseline:  MMT Right eval Left eval  Hip flexion 4 4  Hip extension 4 4  Hip abduction 4 4  Hip adduction    Hip internal rotation    Hip external rotation    Knee flexion 4 4  Knee extension 4 4  Ankle dorsiflexion    Ankle plantarflexion 4 4   Goal status: INITIAL  4.  Obtain FOTO/ODI and determine functional  level; 05/24/23 Goal is 10% Baseline: TBD; 05/24/23 13%;  Goal status: INITIAL  5.  Restore 75% cervical mobility in all deficit regions Baseline:  Active ROM A/PROM (deg) eval  Flexion 90%  Extension 90%  Right lateral flexion 75%  Left lateral flexion 50%  Right rotation 75%  Left rotation 50%   Goal status: INITIAL  6.   Decrease ODI score to 40% disability Baseline: 46% Goal status: INITIAL   PLAN:  PT FREQUENCY: 2x/week  PT DURATION: 6 weeks  PLANNED INTERVENTIONS: Therapeutic exercises, Therapeutic activity, Neuromuscular re-education, Balance training, Gait training, Patient/Family education, Self Care, Joint mobilization, Stair training, Aquatic Therapy, Dry Needling, Electrical stimulation, Spinal mobilization, Cryotherapy, Moist heat, Manual therapy, and Re-evaluation.  PLAN FOR NEXT SESSION: HEP review and update, manual techniques as appropriate, aerobic tasks, ROM and flexibility activities, strengthening and PREs, TPDN, gait and balance training as needed     Hildred Laser, PT 08/23/2023, 6:06 PM

## 2023-08-23 ENCOUNTER — Ambulatory Visit: Payer: 59

## 2023-08-23 DIAGNOSIS — M6281 Muscle weakness (generalized): Secondary | ICD-10-CM

## 2023-08-23 DIAGNOSIS — M5459 Other low back pain: Secondary | ICD-10-CM

## 2023-08-23 DIAGNOSIS — R2689 Other abnormalities of gait and mobility: Secondary | ICD-10-CM

## 2023-08-23 DIAGNOSIS — M542 Cervicalgia: Secondary | ICD-10-CM | POA: Diagnosis not present

## 2023-08-24 ENCOUNTER — Ambulatory Visit (INDEPENDENT_AMBULATORY_CARE_PROVIDER_SITE_OTHER): Payer: 59 | Admitting: Podiatry

## 2023-08-24 ENCOUNTER — Encounter: Payer: Self-pay | Admitting: Podiatry

## 2023-08-24 DIAGNOSIS — Q72899 Other reduction defects of unspecified lower limb: Secondary | ICD-10-CM

## 2023-08-24 DIAGNOSIS — G63 Polyneuropathy in diseases classified elsewhere: Secondary | ICD-10-CM

## 2023-08-24 DIAGNOSIS — M2041 Other hammer toe(s) (acquired), right foot: Secondary | ICD-10-CM | POA: Diagnosis not present

## 2023-08-24 DIAGNOSIS — M21612 Bunion of left foot: Secondary | ICD-10-CM

## 2023-08-24 MED ORDER — GABAPENTIN 300 MG PO CAPS: 300.0000 mg | ORAL_CAPSULE | Freq: Every day | ORAL | 3 refills | Status: AC

## 2023-08-24 NOTE — Progress Notes (Signed)
Subjective:  Patient ID: [name], female    DOB: 08-23-1961,   MRN: [id number]  No chief complaint on file.   62 y.o. female presents for concern of worsening numbness and tingling in the feet. Hoping to restart gabapentin. Also for deformity of her left foot. Relates the right foot has improved from previous. Today more concern for left foot pain. Relates all of her toes are tingling also relates painful callus and issues with her fourth toe hitting things or her shoes and getting irritated. History of brachymetatarsia. Relates history of reconstructive surgery on her right foot with Dr. Victorino Dike.  She does have  a history of right fourth toe amputation and brachymetatarsia as well.  States most of the pain is tingling in nature. Takes meloxicam which helps. She does relates she is getting closer to wanting surgery on the left foot but is not quit ready to undergo the recovery.. Relates burning and tingling in their feet. Patient is diabetic and last A1c was  Lab Results  Component Value Date   HGBA1C 6.3 (H) 05/22/2019   .   PCP:  Renaye Rakers, MD   .  . Denies any other pedal complaints. Denies n/v/f/c.   Past Medical History:  Diagnosis Date   Arthritis    Bowel obstruction (HCC)    Bronchitis    CHF (congestive heart failure) (HCC)    Chronic systolic dysfunction of left ventricle    Diabetes mellitus without complication (HCC)    on oral meds   GERD (gastroesophageal reflux disease)    Headache(784.0)    migraines, hasn't had one for over a year   History of noncompliance with medical treatment    Hypercholesteremia    Hypertension    LVH (left ventricular hypertrophy)    Nonischemic cardiomyopathy (HCC)    Polysubstance abuse (HCC)    Tobacco abuse disorder     Objective:  Physical Exam: Vascular: DP/PT pulses 2/4 bilateral. CFT <3 seconds. Normal hair growth on digits. No edema.  Skin. No lacerations or abrasions bilateral feet.  Musculoskeletal: MMT 5/5  bilateral lower extremities in DF, PF, Inversion and Eversion. Deceased ROM in DF of ankle joint. Right foot with surgical scars first second and third digits and amputation of fourth digit. Fifth digit hammered with hyperkeratosis.  Second digit valgus deformity. No pain to palpation and some edema noted to second digit. Left foot HAV deformity with hammered digits 2-5. And brachymetatarsia of the fourth metatarsal with prominent dorsal fourth digit. Hyperkeratotic tissu enoted plantar third metatarsal head on the left. No pain to palpation around the HAV deformity.  Relates pain but no pain to palpation.  Neurological: Sensation intact to light touch.   Assessment:   1. Polyneuropathy associated with underlying disease (HCC)   2. Hammertoe of right foot   3. Bunion of left foot   4. Brachymetatarsia of fourth metatarsal bone   5. Acquired hammertoe of right foot        Plan:  Patient was evaluated and treated and all questions answered. -Xrays reviewed Right foot with retained hardware from first MPJ fusion as well as second and third digit hammertoe repairs. Fourth digit amptuation.Brachymetatrsia of of fourth metatarsal.  Left foot with severe HAV deformity and degenerative changes noted to the first MPJ. Noted valgus of the second and third digits as well and hammering. Dislocation of the third and near dislocation of the second MPJs. Brachymetatarsia noted of the fourth metatarsal.  -Discussed HAV, hammertoes, and foot deformity following  surgery and treatment options;conservative and surgical management; risks, benefits, alternatives discussed. All patient's questions answered. -Discussed padding and wide shoe gear.   -Recommend continue with good supportive shoes and inserts.  -Discussed surgical options. Discussed surgical procedures needed to help with left foot pain including first MPJ fusion, second and third metatarsal head resections and hammertoe repairs, and fourth digit  amputation.  Patient still not ready for procedure and will plan at later date.  Discussed trying gabapentin for now to see if this eases the neuropathy pain. Gabapentin 300 mg nightly to start.  -Patient to return to office in 3 months for recheck of neuropathy    Louann Sjogren, DPM

## 2023-08-25 ENCOUNTER — Ambulatory Visit: Payer: 59

## 2023-08-25 DIAGNOSIS — R2689 Other abnormalities of gait and mobility: Secondary | ICD-10-CM

## 2023-08-25 DIAGNOSIS — M5459 Other low back pain: Secondary | ICD-10-CM

## 2023-08-25 DIAGNOSIS — M130 Polyarthritis, unspecified: Secondary | ICD-10-CM | POA: Diagnosis not present

## 2023-08-25 DIAGNOSIS — M542 Cervicalgia: Secondary | ICD-10-CM | POA: Diagnosis not present

## 2023-08-25 DIAGNOSIS — I10 Essential (primary) hypertension: Secondary | ICD-10-CM | POA: Diagnosis not present

## 2023-08-25 DIAGNOSIS — M940 Chondrocostal junction syndrome [Tietze]: Secondary | ICD-10-CM | POA: Diagnosis not present

## 2023-08-25 DIAGNOSIS — M6281 Muscle weakness (generalized): Secondary | ICD-10-CM

## 2023-08-25 NOTE — Therapy (Signed)
OUTPATIENT PHYSICAL THERAPY TREATMENT NOTE   Patient Name: [name] MRN: [id number] DOB:07-Mar-1961, 62 y.o., female Today's Date: 08/25/2023  END OF SESSION:  PT End of Session - 08/25/23 1325     Visit Number 14    Number of Visits 24    Date for PT Re-Evaluation 10/03/23    Authorization Type [employer] MCR    PT Start Time 1325   late for appointment   PT Stop Time 1355    PT Time Calculation (min) 30 min    Activity Tolerance Patient tolerated treatment well    Behavior During Therapy Impulsive;WFL for tasks assessed/performed                 Past Medical History:  Diagnosis Date   Arthritis    Bowel obstruction (HCC)    Bronchitis    CHF (congestive heart failure) (HCC)    Chronic systolic dysfunction of left ventricle    Diabetes mellitus without complication (HCC)    on oral meds   GERD (gastroesophageal reflux disease)    Headache(784.0)    migraines, hasn't had one for over a year   History of noncompliance with medical treatment    Hypercholesteremia    Hypertension    LVH (left ventricular hypertrophy)    Nonischemic cardiomyopathy (HCC)    Polysubstance abuse (HCC)    Tobacco abuse disorder    Past Surgical History:  Procedure Laterality Date   ABDOMINAL HYSTERECTOMY     ABDOMINAL SURGERY     left foot surgery     STRABISMUS SURGERY Left 07/31/2013   Procedure: REPAIR STRABISMUS;  Surgeon: Shara Blazing, MD;  Location: [facility] OR;  Service: Ophthalmology;  Laterality: Left;  EYE MUSCLE SURGERY LEFT EYE   UTERINE FIBROID SURGERY     Patient Active Problem List   Diagnosis Date Noted   CKD (chronic kidney disease) 06/28/2018   Chronic systolic CHF (congestive heart failure) (HCC) 06/28/2018   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 06/28/2018   Nephropathy associated with another disease 06/28/2018   Lumbago 06/28/2018   Vitamin D deficiency, unspecified 06/28/2018   Routine general medical examination at a health  care facility 06/28/2018   Dry eye syndrome 06/28/2018   Imbalance 10/04/2017   Diabetes (HCC) 08/09/2016   Left acoustic neuroma (HCC) 10/03/2015   Essential hypertension 03/29/2012   Chronic systolic dysfunction of left ventricle 11/24/2011   ACS (acute coronary syndrome) (HCC) 11/23/2011   CHF, acute (HCC) 11/23/2011   Cocaine abuse (HCC) 11/23/2011   Tobacco abuse 11/23/2011   Pneumonitis, aspiration (HCC) 11/23/2011   Cardiomyopathy (HCC) 11/23/2011   HTN (hypertension), malignant 11/23/2011    PCP: Renaye Rakers, MD   REFERRING PROVIDER: Estanislado Spire PA  REFERRING DIAG: L4 radiculopathy, cervicalgia  Rationale for Evaluation and Treatment: Rehabilitation  THERAPY DIAG:  Other abnormalities of gait and mobility  Cervicalgia  Muscle weakness (generalized)  Other low back pain  ONSET DATE: chronic  SUBJECTIVE:  SUBJECTIVE STATEMENT: Still noting some L axilla soreness, improved since last session.  No increased soreness following last session and feels she can use LUE during session today.  PERTINENT HISTORY:  None available for cervical region  PAIN:  Are you having pain? Yes: NPRS scale: 10/10 Pain location: low back  Pain description: ache Aggravating factors: twisting, sudden movements Relieving factors: position changes  Are you having pain? Yes: NPRS scale: 10/10 Pain location: neck and upper back Pain description: ache Aggravating factors: activity and prolonged positioning Relieving factors: position changes and rest  PRECAUTIONS: None  RED FLAGS: None   WEIGHT BEARING RESTRICTIONS: No  FALLS:  Has patient fallen in last 6 months? Yes. Number of falls 6+  OCCUPATION: part time house keeping  PLOF: Independent  PATIENT GOALS: To reduce and manage my back  pain  NEXT MD VISIT: as needed  OBJECTIVE:   DIAGNOSTIC FINDINGS:  None recent  PATIENT SURVEYS:  ODI TBD; 05/24/23 13; 08/23/23 10/50 20% perceived disability NDI 08/03/23 23/50 46% perceived disability    MUSCLE LENGTH: Hamstrings: Right 90 deg; Left 90 deg PKB unremarkable B  POSTURE: decreased lumbar lordosis Rounded and elevated shoulders  PALPATION: Point tender over B greater trochanters and B gluteus medius 08/03/23 TTP of B UT, levators and scalenes  LUMBAR ROM:   AROM eval 07/13/23  Flexion 75% 75%  Extension 50% P! 50%  Right lateral flexion 50% 50%  Left lateral flexion 50% 50%  Right rotation  75%  Left rotation  75%   (Blank rows = not tested)  LOWER EXTREMITY ROM:   WNL B  Active  Right eval Left eval  Hip flexion    Hip extension    Hip abduction    Hip adduction    Hip internal rotation    Hip external rotation    Knee flexion    Knee extension    Ankle dorsiflexion    Ankle plantarflexion    Ankle inversion    Ankle eversion     (Blank rows = not tested)  LOWER EXTREMITY MMT:    MMT Right eval Left eval  Hip flexion 4 4  Hip extension 4 4  Hip abduction 4 4  Hip adduction    Hip internal rotation    Hip external rotation    Knee flexion 4 4  Knee extension 4 4  Ankle dorsiflexion    Ankle plantarflexion 4 4  Ankle inversion    Ankle eversion     (Blank rows = not tested) CERVICAL ROM:   Active ROM A/PROM (deg) eval  Flexion 90%  Extension 90%  Right lateral flexion 75%  Left lateral flexion 50%  Right rotation 75%  Left rotation 50%   UPPER EXTREMITY MMT:  MMT Right eval Left eval  Shoulder flexion 3+ 4  Shoulder extension 4 4  Shoulder abduction 3+ 4  Shoulder adduction    Shoulder extension    Shoulder internal rotation 4 4  Shoulder external rotation 3+ 4  Middle trapezius    Lower trapezius    Elbow flexion    Elbow extension    Wrist flexion    Wrist extension    Wrist ulnar deviation    Wrist  radial deviation    Wrist pronation    Wrist supination    Grip strength     UPPER EXTREMITY ROM: WFL B   Active ROM Right eval Left eval  Shoulder flexion    Shoulder extension    Shoulder abduction  Shoulder adduction    Shoulder extension    Shoulder internal rotation    Shoulder external rotation    Elbow flexion    Elbow extension    Wrist flexion    Wrist extension    Wrist ulnar deviation    Wrist radial deviation    Wrist pronation    Wrist supination     (Blank rows = not tested)  LUMBAR SPECIAL TESTS:  Straight leg raise test: Negative, Slump test: Negative, and Stork standing: Negative 07/13/23 Slump negative B  FUNCTIONAL TESTS:  5 times sit to stand: 9s arms crossed  GAIT: Distance walked: 75ftx2 Assistive device utilized: None Level of assistance: Complete Independence Comments: minimal trunk rotation  TODAY'S TREATMENT:    [facility] Adult PT Treatment:                                                DATE: 08/25/23 Therapeutic Exercise: Nustep L6 8 min Supine hor abduction YTB 15x B, 15/15 unilaterally Seated UT stretch 30s B Curl ups with p-ball 15x B, 15/15 unilaterally Dead bug with p-ball 10/10 Scalene stertch 30sx3 B  [facility] Adult PT Treatment:                                                DATE: 08/23/23 Therapeutic Exercise: Nustep L6 8 min Seated hamstring stretch 30s x2 B Lumbar extension over countertop 10x Prone on elbows 2 min QL stretch 30s x2 B Open book 10/10 focus on breathing patterns(limited by L shoulder pain) Curl ups with p-ball 15x B, 15/15 unilaterally  [facility] Adult PT Treatment:                                                DATE: 08/17/23 Therapeutic Exercise: Nustep L6 8 min Prone on elbows 2 min Prone press up 10x f/b 10x with PT OP QL stretch 30s x2 B Open book 10/10 focus on breathing patterns Curl ups with p-ball 15x B, 15/15 unilaterally Supine horizontal abduction YTB 15x     DATE: 05/20/23 Eval    PATIENT  EDUCATION:  Education details: Discussed eval findings, rehab rationale and POC and patient is in agreement  Person educated: Patient Education method: Explanation Education comprehension: verbalized understanding and needs further education  HOME EXERCISE PROGRAM: Access Code: [email or web address] URL: [email or web address] Date: 05/23/2023 Prepared by: Gustavus Bryant  Exercises - Sidelying Open Book Thoracic Lumbar Rotation and Extension  - 2 x daily - 5 x weekly - 1 sets - 10 reps - Supine Figure 4 Piriformis Stretch  - 2 x daily - 5 x weekly - 1 sets - 2 reps - 30s hold - Static Prone on Elbows  - 2 x daily - 5 x weekly - 1 sets - 1 reps - 2 min hold - Plank on Knees  - 1 x daily - 5 x weekly - 1 sets - 2 reps - 30s hold  ASSESSMENT:  CLINICAL IMPRESSION: Lateral sway during gait much improved which has translated into less low back discomfort.  Less arm pain reported but refrained from excessive use of LUE  during session.  Focus on core tasks as well as manual stretching of scalene group which provided more mobility and relief of discomfort.  Will undergo ESI on 10/28.  (Eval)Patient seen for cervical evaluation following traumatic fall 6-8 weeks ago resulting in acute on chronic upper back and cervical discomfort and loss of mobility.  AROM of cervical spine demonstrates a muscular and soft tissue component vs a vertebrogenic component.  Patient demonstrates full AROM of BUEs however strength deficits identified in R RC due to history of undocumented pathology which she has been offered surgical correction but has declined.  Palpation finds TTP and taught irritable bands in B UT, levator and scalene groups.  Patient is a good candidate for OPPT to improve mobility and resolve soft tissue restriction and irritability.  (Eval)Patient is a 62 y.o. female who was seen today for physical therapy evaluation and treatment for chronic low back pain. Finding show full LE mobility, no nerve  tension signs and strength deficits in trunk and BLEs.  Lumbar spring testing finds limited intersegmental mobility and subsequent loss of trunk ROM.  Patient would benefit from OPPT to address spinal stiffness and trunk/LE weakness.   OBJECTIVE IMPAIRMENTS: Abnormal gait, decreased activity tolerance, decreased balance, decreased knowledge of condition, decreased mobility, difficulty walking, decreased strength, hypomobility, impaired flexibility, improper body mechanics, and pain.   ACTIVITY LIMITATIONS: carrying, lifting, sitting, standing, squatting, and stairs  PERSONAL FACTORS: Age, Fitness, and Time since onset of injury/illness/exacerbation are also affecting patient's functional outcome.   REHAB POTENTIAL: Good  CLINICAL DECISION MAKING: Stable/uncomplicated  EVALUATION COMPLEXITY: Low   GOALS: Goals reviewed with patient? No  SHORT TERM GOALS: Target date: 06/10/2023  Patient to demonstrate independence in HEP  Baseline: [id number] Goal status: Met   LONG TERM GOALS: Target date: 10/03/2023    Decrease pain to 6/10 Baseline: 10/10 Goal status: INITIAL  2.  Increase trunk ROM to 75% Baseline:  AROM eval  Flexion 75%  Extension 50% P!  Right lateral flexion 50%  Left lateral flexion 50%  Right rotation   Left rotation    Goal status: INITIAL  3.  Increase BLE strength to 4+/5 Baseline:  MMT Right eval Left eval  Hip flexion 4 4  Hip extension 4 4  Hip abduction 4 4  Hip adduction    Hip internal rotation    Hip external rotation    Knee flexion 4 4  Knee extension 4 4  Ankle dorsiflexion    Ankle plantarflexion 4 4   Goal status: INITIAL  4.  Obtain FOTO/ODI and determine functional level; 05/24/23 Goal is 10% Baseline: TBD; 05/24/23 13%;  Goal status: INITIAL  5.  Restore 75% cervical mobility in all deficit regions Baseline:  Active ROM A/PROM (deg) eval  Flexion 90%  Extension 90%  Right lateral flexion 75%  Left lateral flexion 50%  Right  rotation 75%  Left rotation 50%   Goal status: INITIAL  6.   Decrease ODI score to 40% disability Baseline: 46% Goal status: INITIAL   PLAN:  PT FREQUENCY: 2x/week  PT DURATION: 6 weeks  PLANNED INTERVENTIONS: Therapeutic exercises, Therapeutic activity, Neuromuscular re-education, Balance training, Gait training, Patient/Family education, Self Care, Joint mobilization, Stair training, Aquatic Therapy, Dry Needling, Electrical stimulation, Spinal mobilization, Cryotherapy, Moist heat, Manual therapy, and Re-evaluation.  PLAN FOR NEXT SESSION: HEP review and update, manual techniques as appropriate, aerobic tasks, ROM and flexibility activities, strengthening and PREs, TPDN, gait and balance training as needed     Farris Has  Alandis Bluemel, PT 08/25/2023, 2:50 PM

## 2023-08-28 NOTE — Therapy (Unsigned)
OUTPATIENT PHYSICAL THERAPY TREATMENT NOTE   Patient Name: [name] MRN: [id number] DOB:1961/01/24, 62 y.o., female Today's Date: 08/29/2023  END OF SESSION:  PT End of Session - 08/29/23 1219     Visit Number 15    Number of Visits 24    Date for PT Re-Evaluation 10/03/23    Authorization Type [employer] MCR    PT Start Time 1215    PT Stop Time 1255    PT Time Calculation (min) 40 min    Activity Tolerance Patient tolerated treatment well    Behavior During Therapy Impulsive;WFL for tasks assessed/performed                  Past Medical History:  Diagnosis Date   Arthritis    Bowel obstruction (HCC)    Bronchitis    CHF (congestive heart failure) (HCC)    Chronic systolic dysfunction of left ventricle    Diabetes mellitus without complication (HCC)    on oral meds   GERD (gastroesophageal reflux disease)    Headache(784.0)    migraines, hasn't had one for over a year   History of noncompliance with medical treatment    Hypercholesteremia    Hypertension    LVH (left ventricular hypertrophy)    Nonischemic cardiomyopathy (HCC)    Polysubstance abuse (HCC)    Tobacco abuse disorder    Past Surgical History:  Procedure Laterality Date   ABDOMINAL HYSTERECTOMY     ABDOMINAL SURGERY     left foot surgery     STRABISMUS SURGERY Left 07/31/2013   Procedure: REPAIR STRABISMUS;  Surgeon: Shara Blazing, MD;  Location: [facility];  Service: Ophthalmology;  Laterality: Left;  EYE MUSCLE SURGERY LEFT EYE   UTERINE FIBROID SURGERY     Patient Active Problem List   Diagnosis Date Noted   CKD (chronic kidney disease) 06/28/2018   Chronic systolic CHF (congestive heart failure) (HCC) 06/28/2018   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 06/28/2018   Nephropathy associated with another disease 06/28/2018   Lumbago 06/28/2018   Vitamin D deficiency, unspecified 06/28/2018   Routine general medical examination at a health care facility  06/28/2018   Dry eye syndrome 06/28/2018   Imbalance 10/04/2017   Diabetes (HCC) 08/09/2016   Left acoustic neuroma (HCC) 10/03/2015   Essential hypertension 03/29/2012   Chronic systolic dysfunction of left ventricle 11/24/2011   ACS (acute coronary syndrome) (HCC) 11/23/2011   CHF, acute (HCC) 11/23/2011   Cocaine abuse (HCC) 11/23/2011   Tobacco abuse 11/23/2011   Pneumonitis, aspiration (HCC) 11/23/2011   Cardiomyopathy (HCC) 11/23/2011   HTN (hypertension), malignant 11/23/2011    PCP: Renaye Rakers, MD   REFERRING PROVIDER: Estanislado Spire PA  REFERRING DIAG: L4 radiculopathy, cervicalgia  Rationale for Evaluation and Treatment: Rehabilitation  THERAPY DIAG:  Other abnormalities of gait and mobility  Cervicalgia  Muscle weakness (generalized)  Other low back pain  ONSET DATE: chronic  SUBJECTIVE:  SUBJECTIVE STATEMENT: Axilla pain less, told it was the result of a viral infection.  Scheduled for an ESI today and will need to refrain from OPPT 3 days. Overall neck and low back symptoms minimal.  PERTINENT HISTORY:  None available for cervical region  PAIN:  Are you having pain? Yes: NPRS scale: 10/10 Pain location: low back  Pain description: ache Aggravating factors: twisting, sudden movements Relieving factors: position changes  Are you having pain? Yes: NPRS scale: 10/10 Pain location: neck and upper back Pain description: ache Aggravating factors: activity and prolonged positioning Relieving factors: position changes and rest  PRECAUTIONS: None  RED FLAGS: None   WEIGHT BEARING RESTRICTIONS: No  FALLS:  Has patient fallen in last 6 months? Yes. Number of falls 6+  OCCUPATION: part time house keeping  PLOF: Independent  PATIENT GOALS: To reduce and manage my  back pain  NEXT MD VISIT: as needed  OBJECTIVE:   DIAGNOSTIC FINDINGS:  None recent  PATIENT SURVEYS:  ODI TBD; 05/24/23 13; 08/23/23 10/50 20% perceived disability NDI 08/03/23 23/50 46% perceived disability    MUSCLE LENGTH: Hamstrings: Right 90 deg; Left 90 deg PKB unremarkable B  POSTURE: decreased lumbar lordosis Rounded and elevated shoulders  PALPATION: Point tender over B greater trochanters and B gluteus medius 08/03/23 TTP of B UT, levators and scalenes  LUMBAR ROM:   AROM eval 07/13/23  Flexion 75% 75%  Extension 50% P! 50%  Right lateral flexion 50% 50%  Left lateral flexion 50% 50%  Right rotation  75%  Left rotation  75%   (Blank rows = not tested)  LOWER EXTREMITY ROM:   WNL B  Active  Right eval Left eval  Hip flexion    Hip extension    Hip abduction    Hip adduction    Hip internal rotation    Hip external rotation    Knee flexion    Knee extension    Ankle dorsiflexion    Ankle plantarflexion    Ankle inversion    Ankle eversion     (Blank rows = not tested)  LOWER EXTREMITY MMT:    MMT Right eval Left eval  Hip flexion 4 4  Hip extension 4 4  Hip abduction 4 4  Hip adduction    Hip internal rotation    Hip external rotation    Knee flexion 4 4  Knee extension 4 4  Ankle dorsiflexion    Ankle plantarflexion 4 4  Ankle inversion    Ankle eversion     (Blank rows = not tested) CERVICAL ROM:   Active ROM A/PROM (deg) eval  Flexion 90%  Extension 90%  Right lateral flexion 75%  Left lateral flexion 50%  Right rotation 75%  Left rotation 50%   UPPER EXTREMITY MMT:  MMT Right eval Left eval  Shoulder flexion 3+ 4  Shoulder extension 4 4  Shoulder abduction 3+ 4  Shoulder adduction    Shoulder extension    Shoulder internal rotation 4 4  Shoulder external rotation 3+ 4  Middle trapezius    Lower trapezius    Elbow flexion    Elbow extension    Wrist flexion    Wrist extension    Wrist ulnar deviation     Wrist radial deviation    Wrist pronation    Wrist supination    Grip strength     UPPER EXTREMITY ROM: WFL B   Active ROM Right eval Left eval  Shoulder flexion  Shoulder extension    Shoulder abduction    Shoulder adduction    Shoulder extension    Shoulder internal rotation    Shoulder external rotation    Elbow flexion    Elbow extension    Wrist flexion    Wrist extension    Wrist ulnar deviation    Wrist radial deviation    Wrist pronation    Wrist supination     (Blank rows = not tested)  LUMBAR SPECIAL TESTS:  Straight leg raise test: Negative, Slump test: Negative, and Stork standing: Negative 07/13/23 Slump negative B  FUNCTIONAL TESTS:  5 times sit to stand: 9s arms crossed  GAIT: Distance walked: 61ftx2 Assistive device utilized: None Level of assistance: Complete Independence Comments: minimal trunk rotation  TODAY'S TREATMENT:    [facility] Adult PT Treatment:                                                DATE: 08/29/23 Therapeutic Exercise: Nustep L4 8 min Supine QL stretch 30s x2 Supine hor abduction YTB 15x B, 15/15 unilaterally Seated UT stretch 30s B Curl ups with p-ball 15x B, 15/15 unilaterally Dead bug with p-ball 10/10 Scalene stertch 30sx3 B  [facility] Adult PT Treatment:                                                DATE: 08/25/23 Therapeutic Exercise: Nustep L6 8 min Supine hor abduction YTB 15x B, 15/15 unilaterally Seated UT stretch 30s B Curl ups with p-ball 15x B, 15/15 unilaterally Dead bug with p-ball 10/10 Scalene stertch 30sx3 B  [facility] Adult PT Treatment:                                                DATE: 08/23/23 Therapeutic Exercise: Nustep L6 8 min Seated hamstring stretch 30s x2 B Lumbar extension over countertop 10x Prone on elbows 2 min QL stretch 30s x2 B Open book 10/10 focus on breathing patterns(limited by L shoulder pain) Curl ups with p-ball 15x B, 15/15 unilaterally  [facility] Adult PT Treatment:                                                 DATE: 08/17/23 Therapeutic Exercise: Nustep L6 8 min Prone on elbows 2 min Prone press up 10x f/b 10x with PT OP QL stretch 30s x2 B Open book 10/10 focus on breathing patterns Curl ups with p-ball 15x B, 15/15 unilaterally Supine horizontal abduction YTB 15x     DATE: 05/20/23 Eval    PATIENT EDUCATION:  Education details: Discussed eval findings, rehab rationale and POC and patient is in agreement  Person educated: Patient Education method: Explanation Education comprehension: verbalized understanding and needs further education  HOME EXERCISE PROGRAM: Access Code: [email or web address] URL: [email or web address] Date: 05/23/2023 Prepared by: Gustavus Bryant  Exercises - Sidelying Open Book Thoracic Lumbar Rotation and Extension  - 2 x daily - 5 x weekly - 1  sets - 10 reps - Supine Figure 4 Piriformis Stretch  - 2 x daily - 5 x weekly - 1 sets - 2 reps - 30s hold - Static Prone on Elbows  - 2 x daily - 5 x weekly - 1 sets - 1 reps - 2 min hold - Plank on Knees  - 1 x daily - 5 x weekly - 1 sets - 2 reps - 30s hold  ASSESSMENT:  CLINICAL IMPRESSION: Session today focused on core strength and flexibility of cervical and lumbar spines. Intensity and resistance taperd due to upcoming ESI.  More ROM and less soft tissue resistance noted with scalene stretches.  (Eval)Patient seen for cervical evaluation following traumatic fall 6-8 weeks ago resulting in acute on chronic upper back and cervical discomfort and loss of mobility.  AROM of cervical spine demonstrates a muscular and soft tissue component vs a vertebrogenic component.  Patient demonstrates full AROM of BUEs however strength deficits identified in R RC due to history of undocumented pathology which she has been offered surgical correction but has declined.  Palpation finds TTP and taught irritable bands in B UT, levator and scalene groups.  Patient is a good candidate for OPPT to improve  mobility and resolve soft tissue restriction and irritability.  (Eval)Patient is a 62 y.o. female who was seen today for physical therapy evaluation and treatment for chronic low back pain. Finding show full LE mobility, no nerve tension signs and strength deficits in trunk and BLEs.  Lumbar spring testing finds limited intersegmental mobility and subsequent loss of trunk ROM.  Patient would benefit from OPPT to address spinal stiffness and trunk/LE weakness.   OBJECTIVE IMPAIRMENTS: Abnormal gait, decreased activity tolerance, decreased balance, decreased knowledge of condition, decreased mobility, difficulty walking, decreased strength, hypomobility, impaired flexibility, improper body mechanics, and pain.   ACTIVITY LIMITATIONS: carrying, lifting, sitting, standing, squatting, and stairs  PERSONAL FACTORS: Age, Fitness, and Time since onset of injury/illness/exacerbation are also affecting patient's functional outcome.   REHAB POTENTIAL: Good  CLINICAL DECISION MAKING: Stable/uncomplicated  EVALUATION COMPLEXITY: Low   GOALS: Goals reviewed with patient? No  SHORT TERM GOALS: Target date: 06/10/2023  Patient to demonstrate independence in HEP  Baseline: [id number] Goal status: Met   LONG TERM GOALS: Target date: 10/03/2023    Decrease pain to 6/10 Baseline: 10/10 Goal status: INITIAL  2.  Increase trunk ROM to 75% Baseline:  AROM eval  Flexion 75%  Extension 50% P!  Right lateral flexion 50%  Left lateral flexion 50%  Right rotation   Left rotation    Goal status: INITIAL  3.  Increase BLE strength to 4+/5 Baseline:  MMT Right eval Left eval  Hip flexion 4 4  Hip extension 4 4  Hip abduction 4 4  Hip adduction    Hip internal rotation    Hip external rotation    Knee flexion 4 4  Knee extension 4 4  Ankle dorsiflexion    Ankle plantarflexion 4 4   Goal status: INITIAL  4.  Obtain FOTO/ODI and determine functional level; 05/24/23 Goal is 10% Baseline: TBD;  05/24/23 13%;  Goal status: INITIAL  5.  Restore 75% cervical mobility in all deficit regions Baseline:  Active ROM A/PROM (deg) eval  Flexion 90%  Extension 90%  Right lateral flexion 75%  Left lateral flexion 50%  Right rotation 75%  Left rotation 50%   Goal status: INITIAL  6.   Decrease ODI score to 40% disability Baseline: 46% Goal status: INITIAL  PLAN:  PT FREQUENCY: 2x/week  PT DURATION: 6 weeks  PLANNED INTERVENTIONS: Therapeutic exercises, Therapeutic activity, Neuromuscular re-education, Balance training, Gait training, Patient/Family education, Self Care, Joint mobilization, Stair training, Aquatic Therapy, Dry Needling, Electrical stimulation, Spinal mobilization, Cryotherapy, Moist heat, Manual therapy, and Re-evaluation.  PLAN FOR NEXT SESSION: HEP review and update, manual techniques as appropriate, aerobic tasks, ROM and flexibility activities, strengthening and PREs, TPDN, gait and balance training as needed     Hildred Laser, PT 08/29/2023, 12:59 PM

## 2023-08-29 ENCOUNTER — Ambulatory Visit: Payer: 59

## 2023-08-29 DIAGNOSIS — M5416 Radiculopathy, lumbar region: Secondary | ICD-10-CM | POA: Diagnosis not present

## 2023-08-29 DIAGNOSIS — R2689 Other abnormalities of gait and mobility: Secondary | ICD-10-CM | POA: Diagnosis not present

## 2023-08-29 DIAGNOSIS — M5459 Other low back pain: Secondary | ICD-10-CM | POA: Diagnosis not present

## 2023-08-29 DIAGNOSIS — M6281 Muscle weakness (generalized): Secondary | ICD-10-CM | POA: Diagnosis not present

## 2023-08-29 DIAGNOSIS — M542 Cervicalgia: Secondary | ICD-10-CM | POA: Diagnosis not present

## 2023-08-31 ENCOUNTER — Ambulatory Visit: Payer: 59

## 2023-09-06 ENCOUNTER — Ambulatory Visit: Payer: 59 | Attending: Family Medicine

## 2023-09-06 DIAGNOSIS — M5459 Other low back pain: Secondary | ICD-10-CM | POA: Insufficient documentation

## 2023-09-06 DIAGNOSIS — M542 Cervicalgia: Secondary | ICD-10-CM | POA: Insufficient documentation

## 2023-09-06 DIAGNOSIS — M6281 Muscle weakness (generalized): Secondary | ICD-10-CM | POA: Insufficient documentation

## 2023-09-06 DIAGNOSIS — R2689 Other abnormalities of gait and mobility: Secondary | ICD-10-CM | POA: Diagnosis not present

## 2023-09-06 NOTE — Therapy (Signed)
OUTPATIENT PHYSICAL THERAPY TREATMENT NOTE   Patient Name: [name] MRN: [id number] DOB:05/29/1961, 62 y.o., female Today's Date: 09/06/2023  END OF SESSION:  PT End of Session - 09/06/23 1706     Visit Number 16    Number of Visits 24    Date for PT Re-Evaluation 10/03/23    Authorization Type [employer] MCR    PT Start Time 1700    PT Stop Time 1740    PT Time Calculation (min) 40 min    Activity Tolerance Patient tolerated treatment well    Behavior During Therapy Impulsive;WFL for tasks assessed/performed            Past Medical History:  Diagnosis Date   Arthritis    Bowel obstruction (HCC)    Bronchitis    CHF (congestive heart failure) (HCC)    Chronic systolic dysfunction of left ventricle    Diabetes mellitus without complication (HCC)    on oral meds   GERD (gastroesophageal reflux disease)    Headache(784.0)    migraines, hasn't had one for over a year   History of noncompliance with medical treatment    Hypercholesteremia    Hypertension    LVH (left ventricular hypertrophy)    Nonischemic cardiomyopathy (HCC)    Polysubstance abuse (HCC)    Tobacco abuse disorder    Past Surgical History:  Procedure Laterality Date   ABDOMINAL HYSTERECTOMY     ABDOMINAL SURGERY     left foot surgery     STRABISMUS SURGERY Left 07/31/2013   Procedure: REPAIR STRABISMUS;  Surgeon: Shara Blazing, MD;  Location: [facility] OR;  Service: Ophthalmology;  Laterality: Left;  EYE MUSCLE SURGERY LEFT EYE   UTERINE FIBROID SURGERY     Patient Active Problem List   Diagnosis Date Noted   CKD (chronic kidney disease) 06/28/2018   Chronic systolic CHF (congestive heart failure) (HCC) 06/28/2018   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 06/28/2018   Nephropathy associated with another disease 06/28/2018   Lumbago 06/28/2018   Vitamin D deficiency, unspecified 06/28/2018   Routine general medical examination at a health care facility 06/28/2018   Dry eye  syndrome 06/28/2018   Imbalance 10/04/2017   Diabetes (HCC) 08/09/2016   Left acoustic neuroma (HCC) 10/03/2015   Essential hypertension 03/29/2012   Chronic systolic dysfunction of left ventricle 11/24/2011   ACS (acute coronary syndrome) (HCC) 11/23/2011   CHF, acute (HCC) 11/23/2011   Cocaine abuse (HCC) 11/23/2011   Tobacco abuse 11/23/2011   Pneumonitis, aspiration (HCC) 11/23/2011   Cardiomyopathy (HCC) 11/23/2011   HTN (hypertension), malignant 11/23/2011    PCP: Renaye Rakers, MD   REFERRING PROVIDER: Estanislado Spire PA  REFERRING DIAG: L4 radiculopathy, cervicalgia  Rationale for Evaluation and Treatment: Rehabilitation  THERAPY DIAG:  Other abnormalities of gait and mobility  Muscle weakness (generalized)  Other low back pain  Cervicalgia  ONSET DATE: chronic  SUBJECTIVE:  SUBJECTIVE STATEMENT: Arrives with elevated back pain 10+/10.  Report symptoms began this morning when she awoke, does not know if she slept wrong.  Took pain meds prior to today's visit.  PERTINENT HISTORY:  None available for cervical region  PAIN:  Are you having pain? Yes: NPRS scale: 10/10 Pain location: low back  Pain description: ache Aggravating factors: twisting, sudden movements Relieving factors: position changes  Are you having pain? Yes: NPRS scale: 10/10 Pain location: neck and upper back Pain description: ache Aggravating factors: activity and prolonged positioning Relieving factors: position changes and rest  PRECAUTIONS: None  RED FLAGS: None   WEIGHT BEARING RESTRICTIONS: No  FALLS:  Has patient fallen in last 6 months? Yes. Number of falls 6+  OCCUPATION: part time house keeping  PLOF: Independent  PATIENT GOALS: To reduce and manage my back pain  NEXT MD VISIT: as  needed  OBJECTIVE:   DIAGNOSTIC FINDINGS:  None recent  PATIENT SURVEYS:  ODI TBD; 05/24/23 13; 08/23/23 10/50 20% perceived disability NDI 08/03/23 23/50 46% perceived disability    MUSCLE LENGTH: Hamstrings: Right 90 deg; Left 90 deg PKB unremarkable B  POSTURE: decreased lumbar lordosis Rounded and elevated shoulders  PALPATION: Point tender over B greater trochanters and B gluteus medius 08/03/23 TTP of B UT, levators and scalenes  LUMBAR ROM:   AROM eval 07/13/23  Flexion 75% 75%  Extension 50% P! 50%  Right lateral flexion 50% 50%  Left lateral flexion 50% 50%  Right rotation  75%  Left rotation  75%   (Blank rows = not tested)  LOWER EXTREMITY ROM:   WNL B  Active  Right eval Left eval  Hip flexion    Hip extension    Hip abduction    Hip adduction    Hip internal rotation    Hip external rotation    Knee flexion    Knee extension    Ankle dorsiflexion    Ankle plantarflexion    Ankle inversion    Ankle eversion     (Blank rows = not tested)  LOWER EXTREMITY MMT:    MMT Right eval Left eval  Hip flexion 4 4  Hip extension 4 4  Hip abduction 4 4  Hip adduction    Hip internal rotation    Hip external rotation    Knee flexion 4 4  Knee extension 4 4  Ankle dorsiflexion    Ankle plantarflexion 4 4  Ankle inversion    Ankle eversion     (Blank rows = not tested) CERVICAL ROM:   Active ROM A/PROM (deg) eval  Flexion 90%  Extension 90%  Right lateral flexion 75%  Left lateral flexion 50%  Right rotation 75%  Left rotation 50%   UPPER EXTREMITY MMT:  MMT Right eval Left eval  Shoulder flexion 3+ 4  Shoulder extension 4 4  Shoulder abduction 3+ 4  Shoulder adduction    Shoulder extension    Shoulder internal rotation 4 4  Shoulder external rotation 3+ 4  Middle trapezius    Lower trapezius    Elbow flexion    Elbow extension    Wrist flexion    Wrist extension    Wrist ulnar deviation    Wrist radial deviation    Wrist  pronation    Wrist supination    Grip strength     UPPER EXTREMITY ROM: WFL B   Active ROM Right eval Left eval  Shoulder flexion    Shoulder extension  Shoulder abduction    Shoulder adduction    Shoulder extension    Shoulder internal rotation    Shoulder external rotation    Elbow flexion    Elbow extension    Wrist flexion    Wrist extension    Wrist ulnar deviation    Wrist radial deviation    Wrist pronation    Wrist supination     (Blank rows = not tested)  LUMBAR SPECIAL TESTS:  Straight leg raise test: Negative, Slump test: Negative, and Stork standing: Negative 07/13/23 Slump negative B  FUNCTIONAL TESTS:  5 times sit to stand: 9s arms crossed  GAIT: Distance walked: 56ftx2 Assistive device utilized: None Level of assistance: Complete Independence Comments: minimal trunk rotation  TODAY'S TREATMENT:    [facility] Adult PT Treatment:                                                DATE: 09/06/23 Therapeutic Exercise: Nustep L2 8 min Seated hamstring stretch 30s B QL stretch 30s x2 B Prone on elbows 2 min Prone press 10x Curl ups with p-ball 10x B, 10/10 unilaterally SKTC 30s B Slump and SLR negative for symptom reproductio  [facility] Adult PT Treatment:                                                DATE: 08/29/23 Therapeutic Exercise: Nustep L4 8 min Supine QL stretch 30s x2 Supine hor abduction YTB 15x B, 15/15 unilaterally Seated UT stretch 30s B Curl ups with p-ball 15x B, 15/15 unilaterally Dead bug with p-ball 10/10 Scalene stertch 30sx3 B  [facility] Adult PT Treatment:                                                DATE: 08/25/23 Therapeutic Exercise: Nustep L6 8 min Supine hor abduction YTB 15x B, 15/15 unilaterally Seated UT stretch 30s B Curl ups with p-ball 15x B, 15/15 unilaterally Dead bug with p-ball 10/10 Scalene stertch 30sx3 B  [facility] Adult PT Treatment:                                                DATE: 08/23/23 Therapeutic  Exercise: Nustep L6 8 min Seated hamstring stretch 30s x2 B Lumbar extension over countertop 10x Prone on elbows 2 min QL stretch 30s x2 B Open book 10/10 focus on breathing patterns(limited by L shoulder pain) Curl ups with p-ball 15x B, 15/15 unilaterally  [facility] Adult PT Treatment:                                                DATE: 08/17/23 Therapeutic Exercise: Nustep L6 8 min Prone on elbows 2 min Prone press up 10x f/b 10x with PT OP QL stretch 30s x2 B Open book 10/10 focus on breathing patterns Curl  ups with p-ball 15x B, 15/15 unilaterally Supine horizontal abduction YTB 15x     DATE: 05/20/23 Eval    PATIENT EDUCATION:  Education details: Discussed eval findings, rehab rationale and POC and patient is in agreement  Person educated: Patient Education method: Explanation Education comprehension: verbalized understanding and needs further education  HOME EXERCISE PROGRAM: Access Code: [email or web address] URL: [email or web address] Date: 05/23/2023 Prepared by: Gustavus Bryant  Exercises - Sidelying Open Book Thoracic Lumbar Rotation and Extension  - 2 x daily - 5 x weekly - 1 sets - 10 reps - Supine Figure 4 Piriformis Stretch  - 2 x daily - 5 x weekly - 1 sets - 2 reps - 30s hold - Static Prone on Elbows  - 2 x daily - 5 x weekly - 1 sets - 1 reps - 2 min hold - Plank on Knees  - 1 x daily - 5 x weekly - 1 sets - 2 reps - 30s hold  ASSESSMENT:  CLINICAL IMPRESSION: Patient arrived to session with elevated low back pain as well as BLE symptoms following an L3-4 distribution.  Agreeable to participate in session with focus on stretch primarily to gauge activity tolerance.  No tightness reported with stretching tasks and able to tolerate prone lie and prone press.  Rotational actions during position changes exacerbated symptoms.  Instructed tof/u with MD if symptoms persist over next 24 hours.  (Eval)Patient seen for cervical evaluation following traumatic fall 6-8  weeks ago resulting in acute on chronic upper back and cervical discomfort and loss of mobility.  AROM of cervical spine demonstrates a muscular and soft tissue component vs a vertebrogenic component.  Patient demonstrates full AROM of BUEs however strength deficits identified in R RC due to history of undocumented pathology which she has been offered surgical correction but has declined.  Palpation finds TTP and taught irritable bands in B UT, levator and scalene groups.  Patient is a good candidate for OPPT to improve mobility and resolve soft tissue restriction and irritability.  (Eval)Patient is a 62 y.o. female who was seen today for physical therapy evaluation and treatment for chronic low back pain. Finding show full LE mobility, no nerve tension signs and strength deficits in trunk and BLEs.  Lumbar spring testing finds limited intersegmental mobility and subsequent loss of trunk ROM.  Patient would benefit from OPPT to address spinal stiffness and trunk/LE weakness.   OBJECTIVE IMPAIRMENTS: Abnormal gait, decreased activity tolerance, decreased balance, decreased knowledge of condition, decreased mobility, difficulty walking, decreased strength, hypomobility, impaired flexibility, improper body mechanics, and pain.   ACTIVITY LIMITATIONS: carrying, lifting, sitting, standing, squatting, and stairs  PERSONAL FACTORS: Age, Fitness, and Time since onset of injury/illness/exacerbation are also affecting patient's functional outcome.   REHAB POTENTIAL: Good  CLINICAL DECISION MAKING: Stable/uncomplicated  EVALUATION COMPLEXITY: Low   GOALS: Goals reviewed with patient? No  SHORT TERM GOALS: Target date: 06/10/2023  Patient to demonstrate independence in HEP  Baseline: [id number] Goal status: Met   LONG TERM GOALS: Target date: 10/03/2023    Decrease pain to 6/10 Baseline: 10/10 Goal status: INITIAL  2.  Increase trunk ROM to 75% Baseline:  AROM eval  Flexion 75%  Extension 50% P!   Right lateral flexion 50%  Left lateral flexion 50%  Right rotation   Left rotation    Goal status: INITIAL  3.  Increase BLE strength to 4+/5 Baseline:  MMT Right eval Left eval  Hip flexion 4 4  Hip extension 4 4  Hip abduction  4 4  Hip adduction    Hip internal rotation    Hip external rotation    Knee flexion 4 4  Knee extension 4 4  Ankle dorsiflexion    Ankle plantarflexion 4 4   Goal status: INITIAL  4.  Obtain FOTO/ODI and determine functional level; 05/24/23 Goal is 10% Baseline: TBD; 05/24/23 13%;  Goal status: INITIAL  5.  Restore 75% cervical mobility in all deficit regions Baseline:  Active ROM A/PROM (deg) eval  Flexion 90%  Extension 90%  Right lateral flexion 75%  Left lateral flexion 50%  Right rotation 75%  Left rotation 50%   Goal status: INITIAL  6.   Decrease ODI score to 40% disability Baseline: 46% Goal status: INITIAL   PLAN:  PT FREQUENCY: 2x/week  PT DURATION: 6 weeks  PLANNED INTERVENTIONS: Therapeutic exercises, Therapeutic activity, Neuromuscular re-education, Balance training, Gait training, Patient/Family education, Self Care, Joint mobilization, Stair training, Aquatic Therapy, Dry Needling, Electrical stimulation, Spinal mobilization, Cryotherapy, Moist heat, Manual therapy, and Re-evaluation.  PLAN FOR NEXT SESSION: HEP review and update, manual techniques as appropriate, aerobic tasks, ROM and flexibility activities, strengthening and PREs, TPDN, gait and balance training as needed     Hildred Laser, PT 09/06/2023, 5:40 PM

## 2023-09-20 NOTE — Therapy (Addendum)
OUTPATIENT PHYSICAL THERAPY TREATMENT NOTE/DISCHARGE   Patient Name: NYELLA MATTHAI MRN: [id number] DOB:06/04/1961, 62 y.o., female Today's Date: 09/21/2023 PHYSICAL THERAPY DISCHARGE SUMMARY  Visits from Start of Care: 17  Current functional level related to goals / functional outcomes: UTA   Remaining deficits: UTA   Education / Equipment: HEP   Patient agrees to discharge. Patient goals were partially met. Patient is being discharged due to not returning since the last visit.  END OF SESSION:  PT End of Session - 09/21/23 0919     Visit Number 17    Number of Visits 24    Date for PT Re-Evaluation 10/03/23    Authorization Type [employer] MCR    PT Start Time 0920    PT Stop Time 1000    PT Time Calculation (min) 40 min    Activity Tolerance Patient tolerated treatment well    Behavior During Therapy Impulsive;WFL for tasks assessed/performed             Past Medical History:  Diagnosis Date   Arthritis    Bowel obstruction (HCC)    Bronchitis    CHF (congestive heart failure) (HCC)    Chronic systolic dysfunction of left ventricle    Diabetes mellitus without complication (HCC)    on oral meds   GERD (gastroesophageal reflux disease)    Headache(784.0)    migraines, hasn't had one for over a year   History of noncompliance with medical treatment    Hypercholesteremia    Hypertension    LVH (left ventricular hypertrophy)    Nonischemic cardiomyopathy (HCC)    Polysubstance abuse (HCC)    Tobacco abuse disorder    Past Surgical History:  Procedure Laterality Date   ABDOMINAL HYSTERECTOMY     ABDOMINAL SURGERY     left foot surgery     STRABISMUS SURGERY Left 07/31/2013   Procedure: REPAIR STRABISMUS;  Surgeon: Shara Blazing, MD;  Location: [facility] OR;  Service: Ophthalmology;  Laterality: Left;  EYE MUSCLE SURGERY LEFT EYE   UTERINE FIBROID SURGERY     Patient Active Problem List   Diagnosis Date Noted   CKD (chronic kidney disease) 06/28/2018    Chronic systolic CHF (congestive heart failure) (HCC) 06/28/2018   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 06/28/2018   Nephropathy associated with another disease 06/28/2018   Lumbago 06/28/2018   Vitamin D deficiency, unspecified 06/28/2018   Routine general medical examination at a health care facility 06/28/2018   Dry eye syndrome 06/28/2018   Imbalance 10/04/2017   Diabetes (HCC) 08/09/2016   Left acoustic neuroma (HCC) 10/03/2015   Essential hypertension 03/29/2012   Chronic systolic dysfunction of left ventricle 11/24/2011   ACS (acute coronary syndrome) (HCC) 11/23/2011   CHF, acute (HCC) 11/23/2011   Cocaine abuse (HCC) 11/23/2011   Tobacco abuse 11/23/2011   Pneumonitis, aspiration (HCC) 11/23/2011   Cardiomyopathy (HCC) 11/23/2011   HTN (hypertension), malignant 11/23/2011    PCP: Renaye Rakers, MD   REFERRING PROVIDER: Estanislado Spire PA  REFERRING DIAG: L4 radiculopathy, cervicalgia  Rationale for Evaluation and Treatment: Rehabilitation  THERAPY DIAG:  Other abnormalities of gait and mobility  Muscle weakness (generalized)  Other low back pain  Cervicalgia  ONSET DATE: chronic  SUBJECTIVE:  SUBJECTIVE STATEMENT: Reports 0/10 pain, has been able to manage pain with meloxicam and tylenol.  Has been asymptomatic for 1 week.  PERTINENT HISTORY:  None available for cervical region  PAIN:  Are you having pain? Yes: NPRS scale: 10/10 Pain location: low back  Pain description: ache Aggravating factors: twisting, sudden movements Relieving factors: position changes  Are you having pain? Yes: NPRS scale: 10/10 Pain location: neck and upper back Pain description: ache Aggravating factors: activity and prolonged positioning Relieving factors: position  changes and rest  PRECAUTIONS: None  RED FLAGS: None   WEIGHT BEARING RESTRICTIONS: No  FALLS:  Has patient fallen in last 6 months? Yes. Number of falls 6+  OCCUPATION: part time house keeping  PLOF: Independent  PATIENT GOALS: To reduce and manage my back pain  NEXT MD VISIT: as needed  OBJECTIVE:   DIAGNOSTIC FINDINGS:  None recent  PATIENT SURVEYS:  ODI TBD; 05/24/23 13; 08/23/23 10/50 20% perceived disability NDI 08/03/23 23/50 46% perceived disability    MUSCLE LENGTH: Hamstrings: Right 90 deg; Left 90 deg PKB unremarkable B  POSTURE: decreased lumbar lordosis Rounded and elevated shoulders  PALPATION: Point tender over B greater trochanters and B gluteus medius 08/03/23 TTP of B UT, levators and scalenes  LUMBAR ROM:   AROM eval 07/13/23  Flexion 75% 75%  Extension 50% P! 50%  Right lateral flexion 50% 50%  Left lateral flexion 50% 50%  Right rotation  75%  Left rotation  75%   (Blank rows = not tested)  LOWER EXTREMITY ROM:   WNL B  Active  Right eval Left eval  Hip flexion    Hip extension    Hip abduction    Hip adduction    Hip internal rotation    Hip external rotation    Knee flexion    Knee extension    Ankle dorsiflexion    Ankle plantarflexion    Ankle inversion    Ankle eversion     (Blank rows = not tested)  LOWER EXTREMITY MMT:    MMT Right eval Left eval  Hip flexion 4 4  Hip extension 4 4  Hip abduction 4 4  Hip adduction    Hip internal rotation    Hip external rotation    Knee flexion 4 4  Knee extension 4 4  Ankle dorsiflexion    Ankle plantarflexion 4 4  Ankle inversion    Ankle eversion     (Blank rows = not tested) CERVICAL ROM:   Active ROM A/PROM (deg) eval  Flexion 90%  Extension 90%  Right lateral flexion 75%  Left lateral flexion 50%  Right rotation 75%  Left rotation 50%   UPPER EXTREMITY MMT:  MMT Right eval Left eval  Shoulder flexion 3+ 4  Shoulder extension 4 4  Shoulder  abduction 3+ 4  Shoulder adduction    Shoulder extension    Shoulder internal rotation 4 4  Shoulder external rotation 3+ 4  Middle trapezius    Lower trapezius    Elbow flexion    Elbow extension    Wrist flexion    Wrist extension    Wrist ulnar deviation    Wrist radial deviation    Wrist pronation    Wrist supination    Grip strength     UPPER EXTREMITY ROM: WFL B   Active ROM Right eval Left eval  Shoulder flexion    Shoulder extension    Shoulder abduction    Shoulder adduction  Shoulder extension    Shoulder internal rotation    Shoulder external rotation    Elbow flexion    Elbow extension    Wrist flexion    Wrist extension    Wrist ulnar deviation    Wrist radial deviation    Wrist pronation    Wrist supination     (Blank rows = not tested)  LUMBAR SPECIAL TESTS:  Straight leg raise test: Negative, Slump test: Negative, and Stork standing: Negative 07/13/23 Slump negative B  FUNCTIONAL TESTS:  5 times sit to stand: 9s arms crossed  GAIT: Distance walked: 19ftx2 Assistive device utilized: None Level of assistance: Complete Independence Comments: minimal trunk rotation  TODAY'S TREATMENT:    [facility] Adult PT Treatment:                                                DATE: 09/21/23 Therapeutic Exercise: Nustep L4 8 min R ITB 30s x2 PPT 3s 10x PPT with march 10/10 Open book with breathing patterns [facility] Adult PT Treatment:                                                DATE: 09/06/23 Therapeutic Exercise: Nustep L2 8 min Seated hamstring stretch 30s B QL stretch 30s x2 B Prone on elbows 2 min Prone press 10x Curl ups with p-ball 10x B, 10/10 unilaterally SKTC 30s B Slump and SLR negative for symptom reproductio  [facility] Adult PT Treatment:                                                DATE: 08/29/23 Therapeutic Exercise: Nustep L4 8 min Supine QL stretch 30s x2 Supine hor abduction YTB 15x B, 15/15 unilaterally Seated UT stretch 30s B Curl  ups with p-ball 15x B, 15/15 unilaterally Dead bug with p-ball 10/10 Scalene stertch 30sx3 B  [facility] Adult PT Treatment:                                                DATE: 08/25/23 Therapeutic Exercise: Nustep L6 8 min Supine hor abduction YTB 15x B, 15/15 unilaterally Seated UT stretch 30s B Curl ups with p-ball 15x B, 15/15 unilaterally Dead bug with p-ball 10/10 Scalene stertch 30sx3 B  [facility] Adult PT Treatment:                                                DATE: 08/23/23 Therapeutic Exercise: Nustep L6 8 min Seated hamstring stretch 30s x2 B Lumbar extension over countertop 10x Prone on elbows 2 min QL stretch 30s x2 B Open book 10/10 focus on breathing patterns(limited by L shoulder pain) Curl ups with p-ball 15x B, 15/15 unilaterally  [facility] Adult PT Treatment:  DATE: 08/17/23 Therapeutic Exercise: Nustep L6 8 min Prone on elbows 2 min Prone press up 10x f/b 10x with PT OP QL stretch 30s x2 B Open book 10/10 focus on breathing patterns Curl ups with p-ball 15x B, 15/15 unilaterally Supine horizontal abduction YTB 15x     DATE: 05/20/23 Eval    PATIENT EDUCATION:  Education details: Discussed eval findings, rehab rationale and POC and patient is in agreement  Person educated: Patient Education method: Explanation Education comprehension: verbalized understanding and needs further education  HOME EXERCISE PROGRAM: Access Code: [email or web address] URL: [email or web address] Date: 05/23/2023 Prepared by: Gustavus Bryant  Exercises - Sidelying Open Book Thoracic Lumbar Rotation and Extension  - 2 x daily - 5 x weekly - 1 sets - 10 reps - Supine Figure 4 Piriformis Stretch  - 2 x daily - 5 x weekly - 1 sets - 2 reps - 30s hold - Static Prone on Elbows  - 2 x daily - 5 x weekly - 1 sets - 1 reps - 2 min hold - Plank on Knees  - 1 x daily - 5 x weekly - 1 sets - 2 reps - 30s hold  ASSESSMENT:  CLINICAL IMPRESSION:  Cervical and lumbar symptoms resolved.  Shows S&S of R hip bursitis.  Anticipate DC at next session  (Eval)Patient seen for cervical evaluation following traumatic fall 6-8 weeks ago resulting in acute on chronic upper back and cervical discomfort and loss of mobility.  AROM of cervical spine demonstrates a muscular and soft tissue component vs a vertebrogenic component.  Patient demonstrates full AROM of BUEs however strength deficits identified in R RC due to history of undocumented pathology which she has been offered surgical correction but has declined.  Palpation finds TTP and taught irritable bands in B UT, levator and scalene groups.  Patient is a good candidate for OPPT to improve mobility and resolve soft tissue restriction and irritability.  (Eval)Patient is a 62 y.o. female who was seen today for physical therapy evaluation and treatment for chronic low back pain. Finding show full LE mobility, no nerve tension signs and strength deficits in trunk and BLEs.  Lumbar spring testing finds limited intersegmental mobility and subsequent loss of trunk ROM.  Patient would benefit from OPPT to address spinal stiffness and trunk/LE weakness.   OBJECTIVE IMPAIRMENTS: Abnormal gait, decreased activity tolerance, decreased balance, decreased knowledge of condition, decreased mobility, difficulty walking, decreased strength, hypomobility, impaired flexibility, improper body mechanics, and pain.   ACTIVITY LIMITATIONS: carrying, lifting, sitting, standing, squatting, and stairs  PERSONAL FACTORS: Age, Fitness, and Time since onset of injury/illness/exacerbation are also affecting patient's functional outcome.   REHAB POTENTIAL: Good  CLINICAL DECISION MAKING: Stable/uncomplicated  EVALUATION COMPLEXITY: Low   GOALS: Goals reviewed with patient? No  SHORT TERM GOALS: Target date: 06/10/2023  Patient to demonstrate independence in HEP  Baseline: [id number] Goal status: Met   LONG TERM GOALS:  Target date: 10/03/2023    Decrease pain to 6/10 Baseline: 10/10; 09/21/23 0/10 Goal status: Met  2.  Increase trunk ROM to 75% Baseline:  AROM eval  Flexion 75%  Extension 50% P!  Right lateral flexion 50%  Left lateral flexion 50%  Right rotation   Left rotation    Goal status: INITIAL  3.  Increase BLE strength to 4+/5 Baseline:  MMT Right eval Left eval  Hip flexion 4 4  Hip extension 4 4  Hip abduction 4 4  Hip adduction    Hip internal rotation  Hip external rotation    Knee flexion 4 4  Knee extension 4 4  Ankle dorsiflexion    Ankle plantarflexion 4 4   Goal status: INITIAL  4.  Obtain FOTO/ODI and determine functional level; 05/24/23 Goal is 10% Baseline: TBD; 05/24/23 13%;  Goal status: INITIAL  5.  Restore 75% cervical mobility in all deficit regions Baseline:  Active ROM A/PROM (deg) eval  Flexion 90%  Extension 90%  Right lateral flexion 75%  Left lateral flexion 50%  Right rotation 75%  Left rotation 50%   Goal status: INITIAL  6.   Decrease ODI score to 40% disability Baseline: 46% Goal status: INITIAL   PLAN:  PT FREQUENCY: 2x/week  PT DURATION: 6 weeks  PLANNED INTERVENTIONS: Therapeutic exercises, Therapeutic activity, Neuromuscular re-education, Balance training, Gait training, Patient/Family education, Self Care, Joint mobilization, Stair training, Aquatic Therapy, Dry Needling, Electrical stimulation, Spinal mobilization, Cryotherapy, Moist heat, Manual therapy, and Re-evaluation.  PLAN FOR NEXT SESSION: HEP review and update, manual techniques as appropriate, aerobic tasks, ROM and flexibility activities, strengthening and PREs, TPDN, gait and balance training as needed     Hildred Laser, PT 09/21/2023, 9:55 AM

## 2023-09-21 ENCOUNTER — Ambulatory Visit: Payer: 59

## 2023-09-21 DIAGNOSIS — M6281 Muscle weakness (generalized): Secondary | ICD-10-CM

## 2023-09-21 DIAGNOSIS — M5459 Other low back pain: Secondary | ICD-10-CM | POA: Diagnosis not present

## 2023-09-21 DIAGNOSIS — M542 Cervicalgia: Secondary | ICD-10-CM

## 2023-09-21 DIAGNOSIS — R2689 Other abnormalities of gait and mobility: Secondary | ICD-10-CM

## 2023-09-21 NOTE — Therapy (Deleted)
OUTPATIENT PHYSICAL THERAPY TREATMENT NOTE   Patient Name: [name] MRN: [id number] DOB:1961-07-06, 62 y.o., female Today's Date: 09/21/2023  END OF SESSION:    Past Medical History:  Diagnosis Date   Arthritis    Bowel obstruction (HCC)    Bronchitis    CHF (congestive heart failure) (HCC)    Chronic systolic dysfunction of left ventricle    Diabetes mellitus without complication (HCC)    on oral meds   GERD (gastroesophageal reflux disease)    Headache(784.0)    migraines, hasn't had one for over a year   History of noncompliance with medical treatment    Hypercholesteremia    Hypertension    LVH (left ventricular hypertrophy)    Nonischemic cardiomyopathy (HCC)    Polysubstance abuse (HCC)    Tobacco abuse disorder    Past Surgical History:  Procedure Laterality Date   ABDOMINAL HYSTERECTOMY     ABDOMINAL SURGERY     left foot surgery     STRABISMUS SURGERY Left 07/31/2013   Procedure: REPAIR STRABISMUS;  Surgeon: Shara Blazing, MD;  Location: [facility] OR;  Service: Ophthalmology;  Laterality: Left;  EYE MUSCLE SURGERY LEFT EYE   UTERINE FIBROID SURGERY     Patient Active Problem List   Diagnosis Date Noted   CKD (chronic kidney disease) 06/28/2018   Chronic systolic CHF (congestive heart failure) (HCC) 06/28/2018   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 06/28/2018   Nephropathy associated with another disease 06/28/2018   Lumbago 06/28/2018   Vitamin D deficiency, unspecified 06/28/2018   Routine general medical examination at a health care facility 06/28/2018   Dry eye syndrome 06/28/2018   Imbalance 10/04/2017   Diabetes (HCC) 08/09/2016   Left acoustic neuroma (HCC) 10/03/2015   Essential hypertension 03/29/2012   Chronic systolic dysfunction of left ventricle 11/24/2011   ACS (acute coronary syndrome) (HCC) 11/23/2011   CHF, acute (HCC) 11/23/2011   Cocaine abuse (HCC) 11/23/2011   Tobacco abuse 11/23/2011    Pneumonitis, aspiration (HCC) 11/23/2011   Cardiomyopathy (HCC) 11/23/2011   HTN (hypertension), malignant 11/23/2011    PCP: Renaye Rakers, MD   REFERRING PROVIDER: Estanislado Spire PA  REFERRING DIAG: L4 radiculopathy, cervicalgia  Rationale for Evaluation and Treatment: Rehabilitation  THERAPY DIAG:  No diagnosis found.  ONSET DATE: chronic  SUBJECTIVE:                                                                                                                                                                                           SUBJECTIVE STATEMENT: Reports 0/10 pain, has been able to manage pain with meloxicam and tylenol.  Has been asymptomatic for 1 week.  PERTINENT HISTORY:  None available for cervical region  PAIN:  Are you having pain? Yes: NPRS scale: 10/10 Pain location: low back  Pain description: ache Aggravating factors: twisting, sudden movements Relieving factors: position changes  Are you having pain? Yes: NPRS scale: 10/10 Pain location: neck and upper back Pain description: ache Aggravating factors: activity and prolonged positioning Relieving factors: position changes and rest  PRECAUTIONS: None  RED FLAGS: None   WEIGHT BEARING RESTRICTIONS: No  FALLS:  Has patient fallen in last 6 months? Yes. Number of falls 6+  OCCUPATION: part time house keeping  PLOF: Independent  PATIENT GOALS: To reduce and manage my back pain  NEXT MD VISIT: as needed  OBJECTIVE:   DIAGNOSTIC FINDINGS:  None recent  PATIENT SURVEYS:  ODI TBD; 05/24/23 13; 08/23/23 10/50 20% perceived disability NDI 08/03/23 23/50 46% perceived disability    MUSCLE LENGTH: Hamstrings: Right 90 deg; Left 90 deg PKB unremarkable B  POSTURE: decreased lumbar lordosis Rounded and elevated shoulders  PALPATION: Point tender over B greater trochanters and B gluteus medius 08/03/23 TTP of B UT, levators and scalenes  LUMBAR ROM:   AROM eval 07/13/23  Flexion 75% 75%   Extension 50% P! 50%  Right lateral flexion 50% 50%  Left lateral flexion 50% 50%  Right rotation  75%  Left rotation  75%   (Blank rows = not tested)  LOWER EXTREMITY ROM:   WNL B  Active  Right eval Left eval  Hip flexion    Hip extension    Hip abduction    Hip adduction    Hip internal rotation    Hip external rotation    Knee flexion    Knee extension    Ankle dorsiflexion    Ankle plantarflexion    Ankle inversion    Ankle eversion     (Blank rows = not tested)  LOWER EXTREMITY MMT:    MMT Right eval Left eval  Hip flexion 4 4  Hip extension 4 4  Hip abduction 4 4  Hip adduction    Hip internal rotation    Hip external rotation    Knee flexion 4 4  Knee extension 4 4  Ankle dorsiflexion    Ankle plantarflexion 4 4  Ankle inversion    Ankle eversion     (Blank rows = not tested) CERVICAL ROM:   Active ROM A/PROM (deg) eval  Flexion 90%  Extension 90%  Right lateral flexion 75%  Left lateral flexion 50%  Right rotation 75%  Left rotation 50%   UPPER EXTREMITY MMT:  MMT Right eval Left eval  Shoulder flexion 3+ 4  Shoulder extension 4 4  Shoulder abduction 3+ 4  Shoulder adduction    Shoulder extension    Shoulder internal rotation 4 4  Shoulder external rotation 3+ 4  Middle trapezius    Lower trapezius    Elbow flexion    Elbow extension    Wrist flexion    Wrist extension    Wrist ulnar deviation    Wrist radial deviation    Wrist pronation    Wrist supination    Grip strength     UPPER EXTREMITY ROM: WFL B   Active ROM Right eval Left eval  Shoulder flexion    Shoulder extension    Shoulder abduction    Shoulder adduction    Shoulder extension    Shoulder internal rotation    Shoulder external rotation  Elbow flexion    Elbow extension    Wrist flexion    Wrist extension    Wrist ulnar deviation    Wrist radial deviation    Wrist pronation    Wrist supination     (Blank rows = not tested)  LUMBAR SPECIAL  TESTS:  Straight leg raise test: Negative, Slump test: Negative, and Stork standing: Negative 07/13/23 Slump negative B  FUNCTIONAL TESTS:  5 times sit to stand: 9s arms crossed  GAIT: Distance walked: 63ftx2 Assistive device utilized: None Level of assistance: Complete Independence Comments: minimal trunk rotation  TODAY'S TREATMENT:    [facility] Adult PT Treatment:                                                DATE: 09/21/23 Therapeutic Exercise: Nustep L4 8 min R ITB 30s x2 PPT 3s 10x PPT with march 10/10 Open book with breathing patterns [facility] Adult PT Treatment:                                                DATE: 09/06/23 Therapeutic Exercise: Nustep L2 8 min Seated hamstring stretch 30s B QL stretch 30s x2 B Prone on elbows 2 min Prone press 10x Curl ups with p-ball 10x B, 10/10 unilaterally SKTC 30s B Slump and SLR negative for symptom reproductio  [facility] Adult PT Treatment:                                                DATE: 08/29/23 Therapeutic Exercise: Nustep L4 8 min Supine QL stretch 30s x2 Supine hor abduction YTB 15x B, 15/15 unilaterally Seated UT stretch 30s B Curl ups with p-ball 15x B, 15/15 unilaterally Dead bug with p-ball 10/10 Scalene stertch 30sx3 B  [facility] Adult PT Treatment:                                                DATE: 08/25/23 Therapeutic Exercise: Nustep L6 8 min Supine hor abduction YTB 15x B, 15/15 unilaterally Seated UT stretch 30s B Curl ups with p-ball 15x B, 15/15 unilaterally Dead bug with p-ball 10/10 Scalene stertch 30sx3 B  [facility] Adult PT Treatment:                                                DATE: 08/23/23 Therapeutic Exercise: Nustep L6 8 min Seated hamstring stretch 30s x2 B Lumbar extension over countertop 10x Prone on elbows 2 min QL stretch 30s x2 B Open book 10/10 focus on breathing patterns(limited by L shoulder pain) Curl ups with p-ball 15x B, 15/15 unilaterally  [facility] Adult PT Treatment:  DATE: 08/17/23 Therapeutic Exercise: Nustep L6 8 min Prone on elbows 2 min Prone press up 10x f/b 10x with PT OP QL stretch 30s x2 B Open book 10/10 focus on breathing patterns Curl ups with p-ball 15x B, 15/15 unilaterally Supine horizontal abduction YTB 15x     DATE: 05/20/23 Eval    PATIENT EDUCATION:  Education details: Discussed eval findings, rehab rationale and POC and patient is in agreement  Person educated: Patient Education method: Explanation Education comprehension: verbalized understanding and needs further education  HOME EXERCISE PROGRAM: Access Code: [email or web address] URL: [email or web address] Date: 05/23/2023 Prepared by: Gustavus Bryant  Exercises - Sidelying Open Book Thoracic Lumbar Rotation and Extension  - 2 x daily - 5 x weekly - 1 sets - 10 reps - Supine Figure 4 Piriformis Stretch  - 2 x daily - 5 x weekly - 1 sets - 2 reps - 30s hold - Static Prone on Elbows  - 2 x daily - 5 x weekly - 1 sets - 1 reps - 2 min hold - Plank on Knees  - 1 x daily - 5 x weekly - 1 sets - 2 reps - 30s hold  ASSESSMENT:  CLINICAL IMPRESSION: Cervical and lumbar symptoms resolved.  Shows S&S of R hip bursitis.  Anticipate DC at next session  (Eval)Patient seen for cervical evaluation following traumatic fall 6-8 weeks ago resulting in acute on chronic upper back and cervical discomfort and loss of mobility.  AROM of cervical spine demonstrates a muscular and soft tissue component vs a vertebrogenic component.  Patient demonstrates full AROM of BUEs however strength deficits identified in R RC due to history of undocumented pathology which she has been offered surgical correction but has declined.  Palpation finds TTP and taught irritable bands in B UT, levator and scalene groups.  Patient is a good candidate for OPPT to improve mobility and resolve soft tissue restriction and irritability.  (Eval)Patient is a 62 y.o. female who was seen today for  physical therapy evaluation and treatment for chronic low back pain. Finding show full LE mobility, no nerve tension signs and strength deficits in trunk and BLEs.  Lumbar spring testing finds limited intersegmental mobility and subsequent loss of trunk ROM.  Patient would benefit from OPPT to address spinal stiffness and trunk/LE weakness.   OBJECTIVE IMPAIRMENTS: Abnormal gait, decreased activity tolerance, decreased balance, decreased knowledge of condition, decreased mobility, difficulty walking, decreased strength, hypomobility, impaired flexibility, improper body mechanics, and pain.   ACTIVITY LIMITATIONS: carrying, lifting, sitting, standing, squatting, and stairs  PERSONAL FACTORS: Age, Fitness, and Time since onset of injury/illness/exacerbation are also affecting patient's functional outcome.   REHAB POTENTIAL: Good  CLINICAL DECISION MAKING: Stable/uncomplicated  EVALUATION COMPLEXITY: Low   GOALS: Goals reviewed with patient? No  SHORT TERM GOALS: Target date: 06/10/2023  Patient to demonstrate independence in HEP  Baseline: [id number] Goal status: Met   LONG TERM GOALS: Target date: 10/03/2023    Decrease pain to 6/10 Baseline: 10/10; 09/21/23 0/10 Goal status: Met  2.  Increase trunk ROM to 75% Baseline:  AROM eval  Flexion 75%  Extension 50% P!  Right lateral flexion 50%  Left lateral flexion 50%  Right rotation   Left rotation    Goal status: INITIAL  3.  Increase BLE strength to 4+/5 Baseline:  MMT Right eval Left eval  Hip flexion 4 4  Hip extension 4 4  Hip abduction 4 4  Hip adduction    Hip internal rotation    Hip  external rotation    Knee flexion 4 4  Knee extension 4 4  Ankle dorsiflexion    Ankle plantarflexion 4 4   Goal status: INITIAL  4.  Obtain FOTO/ODI and determine functional level; 05/24/23 Goal is 10% Baseline: TBD; 05/24/23 13%;  Goal status: INITIAL  5.  Restore 75% cervical mobility in all deficit regions Baseline:   Active ROM A/PROM (deg) eval  Flexion 90%  Extension 90%  Right lateral flexion 75%  Left lateral flexion 50%  Right rotation 75%  Left rotation 50%   Goal status: INITIAL  6.   Decrease ODI score to 40% disability Baseline: 46% Goal status: INITIAL   PLAN:  PT FREQUENCY: 2x/week  PT DURATION: 6 weeks  PLANNED INTERVENTIONS: Therapeutic exercises, Therapeutic activity, Neuromuscular re-education, Balance training, Gait training, Patient/Family education, Self Care, Joint mobilization, Stair training, Aquatic Therapy, Dry Needling, Electrical stimulation, Spinal mobilization, Cryotherapy, Moist heat, Manual therapy, and Re-evaluation.  PLAN FOR NEXT SESSION: HEP review and update, manual techniques as appropriate, aerobic tasks, ROM and flexibility activities, strengthening and PREs, TPDN, gait and balance training as needed     Hildred Laser, PT 09/21/2023, 3:21 PM

## 2023-09-22 ENCOUNTER — Ambulatory Visit: Payer: 59

## 2023-10-07 DIAGNOSIS — M5416 Radiculopathy, lumbar region: Secondary | ICD-10-CM | POA: Diagnosis not present

## 2023-10-17 DIAGNOSIS — E1169 Type 2 diabetes mellitus with other specified complication: Secondary | ICD-10-CM | POA: Diagnosis not present

## 2023-10-17 DIAGNOSIS — L303 Infective dermatitis: Secondary | ICD-10-CM | POA: Diagnosis not present

## 2023-10-17 DIAGNOSIS — R21 Rash and other nonspecific skin eruption: Secondary | ICD-10-CM | POA: Diagnosis not present

## 2023-10-17 DIAGNOSIS — J399 Disease of upper respiratory tract, unspecified: Secondary | ICD-10-CM | POA: Diagnosis not present

## 2023-10-17 DIAGNOSIS — I10 Essential (primary) hypertension: Secondary | ICD-10-CM | POA: Diagnosis not present

## 2023-10-21 DIAGNOSIS — E782 Mixed hyperlipidemia: Secondary | ICD-10-CM | POA: Diagnosis not present

## 2023-10-21 DIAGNOSIS — E119 Type 2 diabetes mellitus without complications: Secondary | ICD-10-CM | POA: Diagnosis not present

## 2023-10-21 DIAGNOSIS — I10 Essential (primary) hypertension: Secondary | ICD-10-CM | POA: Diagnosis not present

## 2023-10-21 DIAGNOSIS — I5022 Chronic systolic (congestive) heart failure: Secondary | ICD-10-CM | POA: Diagnosis not present

## 2023-11-01 DIAGNOSIS — H40012 Open angle with borderline findings, low risk, left eye: Secondary | ICD-10-CM | POA: Diagnosis not present

## 2023-11-01 DIAGNOSIS — H35342 Macular cyst, hole, or pseudohole, left eye: Secondary | ICD-10-CM | POA: Diagnosis not present

## 2023-11-01 DIAGNOSIS — H25813 Combined forms of age-related cataract, bilateral: Secondary | ICD-10-CM | POA: Diagnosis not present

## 2023-11-01 DIAGNOSIS — H10413 Chronic giant papillary conjunctivitis, bilateral: Secondary | ICD-10-CM | POA: Diagnosis not present

## 2023-11-01 DIAGNOSIS — H04123 Dry eye syndrome of bilateral lacrimal glands: Secondary | ICD-10-CM | POA: Diagnosis not present

## 2023-11-01 DIAGNOSIS — E119 Type 2 diabetes mellitus without complications: Secondary | ICD-10-CM | POA: Diagnosis not present

## 2023-11-01 DIAGNOSIS — H31012 Macula scars of posterior pole (postinflammatory) (post-traumatic), left eye: Secondary | ICD-10-CM | POA: Diagnosis not present

## 2023-11-10 DIAGNOSIS — J399 Disease of upper respiratory tract, unspecified: Secondary | ICD-10-CM | POA: Diagnosis not present

## 2023-11-10 DIAGNOSIS — J440 Chronic obstructive pulmonary disease with acute lower respiratory infection: Secondary | ICD-10-CM | POA: Diagnosis not present

## 2023-11-10 DIAGNOSIS — R058 Other specified cough: Secondary | ICD-10-CM | POA: Diagnosis not present

## 2023-11-10 DIAGNOSIS — I10 Essential (primary) hypertension: Secondary | ICD-10-CM | POA: Diagnosis not present

## 2023-11-28 ENCOUNTER — Ambulatory Visit: Payer: 59 | Admitting: Podiatry

## 2023-12-01 DIAGNOSIS — M130 Polyarthritis, unspecified: Secondary | ICD-10-CM | POA: Diagnosis not present

## 2023-12-01 DIAGNOSIS — J470 Bronchiectasis with acute lower respiratory infection: Secondary | ICD-10-CM | POA: Diagnosis not present

## 2023-12-01 DIAGNOSIS — Z0000 Encounter for general adult medical examination without abnormal findings: Secondary | ICD-10-CM | POA: Diagnosis not present

## 2023-12-01 DIAGNOSIS — E1169 Type 2 diabetes mellitus with other specified complication: Secondary | ICD-10-CM | POA: Diagnosis not present

## 2023-12-01 DIAGNOSIS — I10 Essential (primary) hypertension: Secondary | ICD-10-CM | POA: Diagnosis not present

## 2024-01-02 DIAGNOSIS — E1169 Type 2 diabetes mellitus with other specified complication: Secondary | ICD-10-CM | POA: Diagnosis not present

## 2024-01-02 DIAGNOSIS — I10 Essential (primary) hypertension: Secondary | ICD-10-CM | POA: Diagnosis not present

## 2024-01-02 DIAGNOSIS — R059 Cough, unspecified: Secondary | ICD-10-CM | POA: Diagnosis not present

## 2024-01-02 DIAGNOSIS — E559 Vitamin D deficiency, unspecified: Secondary | ICD-10-CM | POA: Diagnosis not present

## 2024-01-19 DIAGNOSIS — E782 Mixed hyperlipidemia: Secondary | ICD-10-CM | POA: Diagnosis not present

## 2024-01-19 DIAGNOSIS — I10 Essential (primary) hypertension: Secondary | ICD-10-CM | POA: Diagnosis not present

## 2024-01-19 DIAGNOSIS — E119 Type 2 diabetes mellitus without complications: Secondary | ICD-10-CM | POA: Diagnosis not present

## 2024-01-19 DIAGNOSIS — I5022 Chronic systolic (congestive) heart failure: Secondary | ICD-10-CM | POA: Diagnosis not present

## 2024-02-15 ENCOUNTER — Other Ambulatory Visit: Payer: Self-pay | Admitting: Family Medicine

## 2024-02-15 ENCOUNTER — Ambulatory Visit
Admission: RE | Admit: 2024-02-15 | Discharge: 2024-02-15 | Disposition: A | Discharge status: Home / Self Care | Source: Ambulatory Visit | Attending: Family Medicine | Admitting: Family Medicine

## 2024-02-15 ENCOUNTER — Encounter: Payer: Self-pay | Admitting: Sports Medicine

## 2024-02-15 DIAGNOSIS — R112 Nausea with vomiting, unspecified: Secondary | ICD-10-CM

## 2024-02-15 DIAGNOSIS — I10 Essential (primary) hypertension: Secondary | ICD-10-CM | POA: Diagnosis not present

## 2024-02-15 DIAGNOSIS — E1169 Type 2 diabetes mellitus with other specified complication: Secondary | ICD-10-CM | POA: Diagnosis not present

## 2024-02-15 DIAGNOSIS — R1110 Vomiting, unspecified: Secondary | ICD-10-CM | POA: Diagnosis not present

## 2024-02-15 DIAGNOSIS — K219 Gastro-esophageal reflux disease without esophagitis: Secondary | ICD-10-CM | POA: Diagnosis not present

## 2024-02-21 DIAGNOSIS — M5416 Radiculopathy, lumbar region: Secondary | ICD-10-CM | POA: Diagnosis not present

## 2024-02-23 DIAGNOSIS — R112 Nausea with vomiting, unspecified: Secondary | ICD-10-CM | POA: Diagnosis not present

## 2024-02-23 DIAGNOSIS — I10 Essential (primary) hypertension: Secondary | ICD-10-CM | POA: Diagnosis not present

## 2024-02-23 DIAGNOSIS — E1169 Type 2 diabetes mellitus with other specified complication: Secondary | ICD-10-CM | POA: Diagnosis not present

## 2024-03-05 DIAGNOSIS — M5416 Radiculopathy, lumbar region: Secondary | ICD-10-CM | POA: Diagnosis not present

## 2024-04-04 DIAGNOSIS — M5416 Radiculopathy, lumbar region: Secondary | ICD-10-CM | POA: Diagnosis not present

## 2024-04-05 DIAGNOSIS — R229 Localized swelling, mass and lump, unspecified: Secondary | ICD-10-CM | POA: Diagnosis not present

## 2024-04-10 ENCOUNTER — Other Ambulatory Visit: Payer: Self-pay | Admitting: Family Medicine

## 2024-04-10 DIAGNOSIS — N6320 Unspecified lump in the left breast, unspecified quadrant: Secondary | ICD-10-CM

## 2024-04-12 ENCOUNTER — Other Ambulatory Visit: Payer: Self-pay | Admitting: Family Medicine

## 2024-04-12 DIAGNOSIS — M25562 Pain in left knee: Secondary | ICD-10-CM | POA: Diagnosis not present

## 2024-04-12 DIAGNOSIS — M25522 Pain in left elbow: Secondary | ICD-10-CM | POA: Diagnosis not present

## 2024-04-12 DIAGNOSIS — N6320 Unspecified lump in the left breast, unspecified quadrant: Secondary | ICD-10-CM

## 2024-04-13 DIAGNOSIS — E1169 Type 2 diabetes mellitus with other specified complication: Secondary | ICD-10-CM | POA: Diagnosis not present

## 2024-04-13 DIAGNOSIS — R296 Repeated falls: Secondary | ICD-10-CM | POA: Diagnosis not present

## 2024-04-13 DIAGNOSIS — M940 Chondrocostal junction syndrome [Tietze]: Secondary | ICD-10-CM | POA: Diagnosis not present

## 2024-04-13 DIAGNOSIS — I10 Essential (primary) hypertension: Secondary | ICD-10-CM | POA: Diagnosis not present

## 2024-04-17 ENCOUNTER — Ambulatory Visit
Admission: RE | Admit: 2024-04-17 | Discharge: 2024-04-17 | Disposition: A | Discharge status: Home / Self Care | Source: Ambulatory Visit | Attending: Family Medicine | Admitting: Family Medicine

## 2024-04-17 DIAGNOSIS — N6320 Unspecified lump in the left breast, unspecified quadrant: Secondary | ICD-10-CM

## 2024-04-17 DIAGNOSIS — N644 Mastodynia: Secondary | ICD-10-CM | POA: Diagnosis not present

## 2024-04-19 DIAGNOSIS — E782 Mixed hyperlipidemia: Secondary | ICD-10-CM | POA: Diagnosis not present

## 2024-04-19 DIAGNOSIS — E119 Type 2 diabetes mellitus without complications: Secondary | ICD-10-CM | POA: Diagnosis not present

## 2024-04-19 DIAGNOSIS — I5022 Chronic systolic (congestive) heart failure: Secondary | ICD-10-CM | POA: Diagnosis not present

## 2024-04-19 DIAGNOSIS — I10 Essential (primary) hypertension: Secondary | ICD-10-CM | POA: Diagnosis not present

## 2024-05-01 NOTE — Therapy (Unsigned)
 OUTPATIENT PHYSICAL THERAPY THORACOLUMBAR/LE EVALUATION   Patient Name: [name] MRN: [id number] DOB:[birth year], 63 y.o., female Today's Date: 05/02/2024  END OF SESSION:  PT End of Session - 05/02/24 1452     Visit Number 1    Number of Visits 4    Date for PT Re-Evaluation 07/03/24    Authorization Type [employer] MCR    PT Start Time 1445    PT Stop Time 1530    PT Time Calculation (min) 45 min    Activity Tolerance Patient tolerated treatment well    Behavior During Therapy WFL for tasks assessed/performed          Past Medical History:  Diagnosis Date   Arthritis    Bowel obstruction (HCC)    Bronchitis    CHF (congestive heart failure) (HCC)    Chronic systolic dysfunction of left ventricle    Diabetes mellitus without complication (HCC)    on oral meds   GERD (gastroesophageal reflux disease)    Headache(784.0)    migraines, hasn't had one for over a year   History of noncompliance with medical treatment    Hypercholesteremia    Hypertension    LVH (left ventricular hypertrophy)    Nonischemic cardiomyopathy (HCC)    Polysubstance abuse (HCC)    Tobacco abuse disorder    Past Surgical History:  Procedure Laterality Date   ABDOMINAL HYSTERECTOMY     ABDOMINAL SURGERY     left foot surgery     STRABISMUS SURGERY Left 07/31/2013   Procedure: REPAIR STRABISMUS;  Surgeon: Elsie MALVA Salt, MD;  Location: [facility] OR;  Service: Ophthalmology;  Laterality: Left;  EYE MUSCLE SURGERY LEFT EYE   UTERINE FIBROID SURGERY     Patient Active Problem List   Diagnosis Date Noted   CKD (chronic kidney disease) 06/28/2018   Chronic systolic CHF (congestive heart failure) (HCC) 06/28/2018   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 06/28/2018   Nephropathy associated with another disease 06/28/2018   Lumbago 06/28/2018   Vitamin D  deficiency, unspecified 06/28/2018   Routine general medical examination at a health care facility 06/28/2018   Dry eye  syndrome 06/28/2018   Imbalance 10/04/2017   Diabetes (HCC) 08/09/2016   Left acoustic neuroma (HCC) 10/03/2015   Essential hypertension 03/29/2012   Chronic systolic dysfunction of left ventricle 11/24/2011   ACS (acute coronary syndrome) (HCC) 11/23/2011   CHF, acute (HCC) 11/23/2011   Cocaine abuse (HCC) 11/23/2011   Tobacco abuse 11/23/2011   Pneumonitis, aspiration (HCC) 11/23/2011   Cardiomyopathy (HCC) 11/23/2011   HTN (hypertension), malignant 11/23/2011    PCP: Benjamine Aland, MD  REFERRING PROVIDER: Benjamine Aland, MD  REFERRING DIAG: R29.6 (ICD-10-CM) - Repeated falls  Rationale for Evaluation and Treatment: Rehabilitation  THERAPY DIAG:  Other abnormalities of gait and mobility  Muscle weakness (generalized)  Other low back pain  ONSET DATE: chronic  SUBJECTIVE:  SUBJECTIVE STATEMENT: Returns to OPPT to address repeated falls of unknown etiology.  Patient denies N&T in feet. Most falls occur when wearing CROCs type shoes.  PERTINENT HISTORY:  None current  PAIN:  Are you having pain? No  PRECAUTIONS: Fall  RED FLAGS: None   WEIGHT BEARING RESTRICTIONS: No  FALLS:  Has patient fallen in last 6 months? Yes. Number of falls 2  OCCUPATION: retired  PLOF: Independent  PATIENT GOALS: To prevent falls  NEXT MD VISIT: TBD  OBJECTIVE:  Note: Objective measures were completed at Evaluation unless otherwise noted.  DIAGNOSTIC FINDINGS:  IMPRESSION: 1. Generalized lumbar spine degeneration that is progressed from a 2008 comparison. New mild anterolisthesis at L4-5, facet mediated. 2. Up to moderate foraminal narrowing on the left at L5-S1. Up to mild spinal stenosis at L4-5.     Electronically Signed   By: Dorn Roulette M.D.   On: 06/03/2023 18:12  PATIENT  SURVEYS:  LEFS   MUSCLE LENGTH: Hamstrings: WNL  POSTURE: wide BOS, B knee varus, pes cavus B  PALPATION: unremarkable  LUMBAR ROM: N/T  AROM eval  Flexion   Extension   Right lateral flexion   Left lateral flexion   Right rotation   Left rotation    (Blank rows = not tested)  LOWER EXTREMITY ROM:     A/PROM Right eval Left eval  Hip flexion    Hip extension    Hip abduction    Hip adduction    Hip internal rotation    Hip external rotation    Knee flexion    Knee extension    Ankle dorsiflexion 10/15 10/15  Ankle plantarflexion    Ankle inversion    Ankle eversion     (Blank rows = not tested)  LOWER EXTREMITY MMT:    MMT Right eval Left eval  Hip flexion    Hip extension    Hip abduction    Hip adduction    Hip internal rotation    Hip external rotation    Knee flexion    Knee extension    Ankle dorsiflexion 4 4-  Ankle plantarflexion 4+ 4+  Ankle inversion    Ankle eversion     (Blank rows = not tested)  LUMBAR SPECIAL TESTS:  Straight leg raise test: Negative and Slump test: Negative  FUNCTIONAL TESTS:  30 seconds chair stand test  GAIT: Distance walked: 89ftx2 Assistive device utilized: None Level of assistance: Complete Independence Comments: limited trunk/pelvic rotation  TREATMENT: [facility] Adult PT Treatment:                                                DATE: 05/02/24 Eval and HEP Self Care: Additional minutes spent for educating on updated Therapeutic Home Exercise Program as well as comparing current status to condition at start of symptoms. This included exercises focusing on stretching, strengthening, with focus on eccentric aspects. Long term goals include an improvement in range of motion, strength, endurance as well as avoiding reinjury. Patient's frequency would include in 1-2 times a day, 3-5 times a week for a duration of 6-12 weeks. Proper technique shown and discussed handout in great detail. All questions were discussed and  addressed.  PATIENT EDUCATION:  Education details: Discussed eval findings, rehab rationale and POC and patient is in agreement  Person educated: Patient Education method: Explanation and Handouts Education comprehension: verbalized understanding and needs further education  HOME EXERCISE PROGRAM: Access Code: [email or web address] URL: [email or web address] Date: 05/02/2024 Prepared by: Reyes Kohut  Exercises - Single Leg Stance with Support  - 2 x daily - 5 x weekly - 1 sets - 30s hold - Toe Raises with Counter Support  - 2 x daily - 5 x weekly - 1 sets - 15 reps  ASSESSMENT:  CLINICAL IMPRESSION: Patient is a 63 y.o. female who was seen today for physical therapy evaluation and treatment for frequent falls and balance assessment. Patient scores 50/56 on BBT, demonstrates mild strength and ROM deficits in DF and presents with postural changes of B knee varus and pes cavus.  LOB noted with SLS stand tasks as well as when in a narrow BOS position  OBJECTIVE IMPAIRMENTS: Abnormal gait, decreased balance, decreased mobility, difficulty walking, decreased ROM, decreased strength, impaired flexibility, and postural dysfunction.   ACTIVITY LIMITATIONS: stairs and locomotion level  PERSONAL FACTORS: Age, Behavior pattern, Fitness, Past/current experiences, and Time since onset of injury/illness/exacerbation are also affecting patient's functional outcome.   REHAB POTENTIAL: Fair based on degenerative changes   CLINICAL DECISION MAKING: Stable/uncomplicated  EVALUATION COMPLEXITY: Low   GOALS: Goals reviewed with patient? No    SHORT TERM GOALS=LONG TERM GOALS: Target date: 07/03/24  Assess dynamic balance to identify deficits Baseline: TBD Goal status: INITIAL  2.  Patient to demonstrate independence in HEP Baseline: [id number] Goal status:  INITIAL  3.  Patient will score at least 74/80 on LEFS to signify clinically meaningful improvement in functional abilities.   Baseline: 66/80 Goal status: INITIAL  4.  Increase AROM DF to 15d Baseline: 10d Goal status: INITIAL    PLAN:  PT FREQUENCY: 1-2x/week  PT DURATION: 6 weeks  PLANNED INTERVENTIONS: 97110-Therapeutic exercises, 97530- Therapeutic activity, [id number]- Neuromuscular re-education, 97535- Self Care, [id number]- Manual therapy, [phone]- Gait training, Patient/Family education, Balance training, Stair training, and Spinal mobilization.  PLAN FOR NEXT SESSION: HEP review and update, manual techniques as appropriate, aerobic tasks, ROM and flexibility activities, strengthening and PREs, TPDN, gait and balance training as needed    Date of referral: 04/23/24 Referring provider: Benjamine Aland, MD Referring diagnosis? R29.6 (ICD-10-CM) - Repeated falls Treatment diagnosis? (if different than referring diagnosis) frequent falls  What was this (referring dx) caused by? Unspecified  Nature of Condition: Chronic (continuous duration > 3 months)   Laterality: Both  Current Functional Measure Score: LEFS 66/80  Objective measurements identify impairments when they are compared to normal values, the uninvolved extremity, and prior level of function.  [x]  Yes  []  No  Objective assessment of functional ability: Minimal functional limitations   Briefly describe symptoms: LOB noted with SLS stand tasks as well as when in a narrow BOS position  How did symptoms start: undetermined  Average pain intensity:  Last 24 hours: 0  Past week: 0  How often does the pt experience symptoms? Occasionally  How much have the symptoms interfered with usual daily activities? A little bit  How has condition changed since care began at this facility? NA - initial visit  In general, how is the patients overall health? Very Good   BACK PAIN (STarT Back Screening Tool) No   Reyes CHRISTELLA Kohut, PT 05/02/2024, 3:58 PM

## 2024-05-02 ENCOUNTER — Other Ambulatory Visit: Payer: Self-pay

## 2024-05-02 ENCOUNTER — Ambulatory Visit: Attending: Family Medicine

## 2024-05-02 DIAGNOSIS — M6281 Muscle weakness (generalized): Secondary | ICD-10-CM | POA: Insufficient documentation

## 2024-05-02 DIAGNOSIS — M5459 Other low back pain: Secondary | ICD-10-CM | POA: Diagnosis not present

## 2024-05-02 DIAGNOSIS — R2689 Other abnormalities of gait and mobility: Secondary | ICD-10-CM | POA: Insufficient documentation

## 2024-05-08 DIAGNOSIS — S5002XA Contusion of left elbow, initial encounter: Secondary | ICD-10-CM | POA: Diagnosis not present

## 2024-05-08 DIAGNOSIS — W108XXD Fall (on) (from) other stairs and steps, subsequent encounter: Secondary | ICD-10-CM | POA: Diagnosis not present

## 2024-05-08 DIAGNOSIS — M7022 Olecranon bursitis, left elbow: Secondary | ICD-10-CM | POA: Diagnosis not present

## 2024-05-08 DIAGNOSIS — M25522 Pain in left elbow: Secondary | ICD-10-CM | POA: Diagnosis not present

## 2024-05-15 DIAGNOSIS — M7022 Olecranon bursitis, left elbow: Secondary | ICD-10-CM | POA: Diagnosis not present

## 2024-05-15 NOTE — Therapy (Unsigned)
 OUTPATIENT PHYSICAL THERAPY THORACOLUMBAR/LE EVALUATION   Patient Name: [name] MRN: [id number] DOB:[birth year], 63 y.o., female Today's Date: 05/17/2024  END OF SESSION:  PT End of Session - 05/17/24 1618     Visit Number 2    Number of Visits 4    Date for PT Re-Evaluation 07/03/24    Authorization Type [employer] MCR    PT Start Time 1615    PT Stop Time 1655    PT Time Calculation (min) 40 min    Activity Tolerance Patient tolerated treatment well    Behavior During Therapy WFL for tasks assessed/performed           Past Medical History:  Diagnosis Date   Arthritis    Bowel obstruction (HCC)    Bronchitis    CHF (congestive heart failure) (HCC)    Chronic systolic dysfunction of left ventricle    Diabetes mellitus without complication (HCC)    on oral meds   GERD (gastroesophageal reflux disease)    Headache(784.0)    migraines, hasn't had one for over a year   History of noncompliance with medical treatment    Hypercholesteremia    Hypertension    LVH (left ventricular hypertrophy)    Nonischemic cardiomyopathy (HCC)    Polysubstance abuse (HCC)    Tobacco abuse disorder    Past Surgical History:  Procedure Laterality Date   ABDOMINAL HYSTERECTOMY     ABDOMINAL SURGERY     left foot surgery     STRABISMUS SURGERY Left 07/31/2013   Procedure: REPAIR STRABISMUS;  Surgeon: Elsie MALVA Salt, MD;  Location: [facility] OR;  Service: Ophthalmology;  Laterality: Left;  EYE MUSCLE SURGERY LEFT EYE   UTERINE FIBROID SURGERY     Patient Active Problem List   Diagnosis Date Noted   CKD (chronic kidney disease) 06/28/2018   Chronic systolic CHF (congestive heart failure) (HCC) 06/28/2018   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 06/28/2018   Nephropathy associated with another disease 06/28/2018   Lumbago 06/28/2018   Vitamin D  deficiency, unspecified 06/28/2018   Routine general medical examination at a health care facility 06/28/2018   Dry eye  syndrome 06/28/2018   Imbalance 10/04/2017   Diabetes (HCC) 08/09/2016   Left acoustic neuroma (HCC) 10/03/2015   Essential hypertension 03/29/2012   Chronic systolic dysfunction of left ventricle 11/24/2011   ACS (acute coronary syndrome) (HCC) 11/23/2011   CHF, acute (HCC) 11/23/2011   Cocaine abuse (HCC) 11/23/2011   Tobacco abuse 11/23/2011   Pneumonitis, aspiration (HCC) 11/23/2011   Cardiomyopathy (HCC) 11/23/2011   HTN (hypertension), malignant 11/23/2011    PCP: Benjamine Aland, MD  REFERRING PROVIDER: Benjamine Aland, MD  REFERRING DIAG: R29.6 (ICD-10-CM) - Repeated falls  Rationale for Evaluation and Treatment: Rehabilitation  THERAPY DIAG:  Other abnormalities of gait and mobility  Muscle weakness (generalized)  ONSET DATE: chronic  SUBJECTIVE:  SUBJECTIVE STATEMENT: Has obtained more supportive shoes and feels he balance and stability have greatly improved.   PERTINENT HISTORY:  None current  PAIN:  Are you having pain? No  PRECAUTIONS: Fall  RED FLAGS: None   WEIGHT BEARING RESTRICTIONS: No  FALLS:  Has patient fallen in last 6 months? Yes. Number of falls 2  OCCUPATION: retired  PLOF: Independent  PATIENT GOALS: To prevent falls  NEXT MD VISIT: TBD  OBJECTIVE:  Note: Objective measures were completed at Evaluation unless otherwise noted.  DIAGNOSTIC FINDINGS:  IMPRESSION: 1. Generalized lumbar spine degeneration that is progressed from a 2008 comparison. New mild anterolisthesis at L4-5, facet mediated. 2. Up to moderate foraminal narrowing on the left at L5-S1. Up to mild spinal stenosis at L4-5.     Electronically Signed   By: Dorn Roulette M.D.   On: 06/03/2023 18:12  PATIENT SURVEYS:  LEFS 66/80  MUSCLE LENGTH: Hamstrings: WNL  POSTURE: wide  BOS, B knee varus, pes cavus B  PALPATION: unremarkable  LUMBAR ROM: N/T  AROM eval  Flexion   Extension   Right lateral flexion   Left lateral flexion   Right rotation   Left rotation    (Blank rows = not tested)  LOWER EXTREMITY ROM:     A/PROM Right eval Left eval  Hip flexion    Hip extension    Hip abduction    Hip adduction    Hip internal rotation    Hip external rotation    Knee flexion    Knee extension    Ankle dorsiflexion 10/15 10/15  Ankle plantarflexion    Ankle inversion    Ankle eversion     (Blank rows = not tested)  LOWER EXTREMITY MMT:    MMT Right eval Left eval  Hip flexion    Hip extension    Hip abduction    Hip adduction    Hip internal rotation    Hip external rotation    Knee flexion    Knee extension    Ankle dorsiflexion 4 4-  Ankle plantarflexion 4+ 4+  Ankle inversion    Ankle eversion     (Blank rows = not tested)  LUMBAR SPECIAL TESTS:  Straight leg raise test: Negative and Slump test: Negative  FUNCTIONAL TESTS:  30 seconds chair stand test  GAIT: Distance walked: 83ftx2 Assistive device utilized: None Level of assistance: Complete Independence Comments: limited trunk/pelvic rotation  TREATMENT: [facility] Adult PT Treatment:                                                DATE: 05/17/24 Therapeutic Exercise: Nustep L4 8 min Neuromuscular re-ed: Supine hip fallouts GTB 15x B, 15/15 unilaterally S/L clams GTB 15/15 Bridge against GTB 15x Runners step 8 in 10/10 5# KB STS 10x from airex pad Therapeutic Activity: Quadruped UE 10/10 Quadruped LE 10/10 Bird dog 10/10 Heel raises from 4 in step 15x Side stepping 3 trips against Yellow band    [facility] Adult PT Treatment:                                                DATE: 05/02/24 Eval and HEP Self Care: Additional minutes spent for educating on updated  Therapeutic Home Exercise Program as well as comparing current status to condition at start of symptoms. This  included exercises focusing on stretching, strengthening, with focus on eccentric aspects. Long term goals include an improvement in range of motion, strength, endurance as well as avoiding reinjury. Patient's frequency would include in 1-2 times a day, 3-5 times a week for a duration of 6-12 weeks. Proper technique shown and discussed handout in great detail. All questions were discussed and addressed.                                                                                                                                   PATIENT EDUCATION:  Education details: Discussed eval findings, rehab rationale and POC and patient is in agreement  Person educated: Patient Education method: Explanation and Handouts Education comprehension: verbalized understanding and needs further education  HOME EXERCISE PROGRAM: Access Code: [email or web address] URL: [email or web address] Date: 05/02/2024 Prepared by: Reyes Kohut  Exercises - Single Leg Stance with Support  - 2 x daily - 5 x weekly - 1 sets - 30s hold - Toe Raises with Counter Support  - 2 x daily - 5 x weekly - 1 sets - 15 reps  ASSESSMENT:  CLINICAL IMPRESSION:Focus of todays session was hip abductor strengthening advancing to balance and proprioceptive stepping tasks.  Incorporated STS from compliant surface encouraging proper WS onto balls of feet.  Patient is a 63 y.o. female who was seen today for physical therapy evaluation and treatment for frequent falls and balance assessment. Patient scores 50/56 on BBT, demonstrates mild strength and ROM deficits in DF and presents with postural changes of B knee varus and pes cavus.  LOB noted with SLS stand tasks as well as when in a narrow BOS position  OBJECTIVE IMPAIRMENTS: Abnormal gait, decreased balance, decreased mobility, difficulty walking, decreased ROM, decreased strength, impaired flexibility, and postural dysfunction.   ACTIVITY LIMITATIONS: stairs and locomotion  level  PERSONAL FACTORS: Age, Behavior pattern, Fitness, Past/current experiences, and Time since onset of injury/illness/exacerbation are also affecting patient's functional outcome.   REHAB POTENTIAL: Fair based on degenerative changes   CLINICAL DECISION MAKING: Stable/uncomplicated  EVALUATION COMPLEXITY: Low   GOALS: Goals reviewed with patient? No    SHORT TERM GOALS=LONG TERM GOALS: Target date: 07/03/24  Assess dynamic balance to identify deficits Baseline: TBD Goal status: INITIAL  2.  Patient to demonstrate independence in HEP Baseline: [id number] Goal status: INITIAL  3.  Patient will score at least 74/80 on LEFS to signify clinically meaningful improvement in functional abilities.   Baseline: 66/80 Goal status: INITIAL  4.  Increase AROM DF to 15d Baseline: 10d Goal status: INITIAL    PLAN:  PT FREQUENCY: 1-2x/week  PT DURATION: 6 weeks  PLANNED INTERVENTIONS: 97110-Therapeutic exercises, 97530- Therapeutic activity, [id number]- Neuromuscular re-education, 97535- Self Care, 02859- Manual therapy, [phone]- Gait training, Patient/Family education, Balance training, Stair training, and Spinal mobilization.  PLAN FOR NEXT SESSION: HEP  review and update, manual techniques as appropriate, aerobic tasks, ROM and flexibility activities, strengthening and PREs, TPDN, gait and balance training as needed    Date of referral: 04/23/24 Referring provider: Benjamine Aland, MD Referring diagnosis? R29.6 (ICD-10-CM) - Repeated falls Treatment diagnosis? (if different than referring diagnosis) frequent falls  What was this (referring dx) caused by? Unspecified  Nature of Condition: Chronic (continuous duration > 3 months)   Laterality: Both  Current Functional Measure Score: LEFS 66/80  Objective measurements identify impairments when they are compared to normal values, the uninvolved extremity, and prior level of function.  [x]  Yes  []  No  Objective assessment of  functional ability: Minimal functional limitations   Briefly describe symptoms: LOB noted with SLS stand tasks as well as when in a narrow BOS position  How did symptoms start: undetermined  Average pain intensity:  Last 24 hours: 0  Past week: 0  How often does the pt experience symptoms? Occasionally  How much have the symptoms interfered with usual daily activities? A little bit  How has condition changed since care began at this facility? NA - initial visit  In general, how is the patients overall health? Very Good   BACK PAIN (STarT Back Screening Tool) No   Reyes CHRISTELLA Kohut, PT 05/17/2024, 4:51 PM

## 2024-05-17 ENCOUNTER — Ambulatory Visit

## 2024-05-17 DIAGNOSIS — R2689 Other abnormalities of gait and mobility: Secondary | ICD-10-CM | POA: Diagnosis not present

## 2024-05-17 DIAGNOSIS — M6281 Muscle weakness (generalized): Secondary | ICD-10-CM | POA: Diagnosis not present

## 2024-05-17 DIAGNOSIS — M5459 Other low back pain: Secondary | ICD-10-CM | POA: Diagnosis not present

## 2024-05-23 NOTE — Therapy (Unsigned)
 OUTPATIENT PHYSICAL THERAPY TREATMENT NOTE/DC SUMMARY   Patient Name: [name] MRN: [id number] DOB:[birth year], 63 y.o., female Today's Date: 05/24/2024  END OF SESSION:  PT End of Session - 05/24/24 1614     Visit Number 3    Number of Visits 4    Date for PT Re-Evaluation 07/03/24    Authorization Type [employer] MCR    PT Start Time 1615    PT Stop Time 1655    PT Time Calculation (min) 40 min    Activity Tolerance Patient tolerated treatment well    Behavior During Therapy WFL for tasks assessed/performed            Past Medical History:  Diagnosis Date   Arthritis    Bowel obstruction (HCC)    Bronchitis    CHF (congestive heart failure) (HCC)    Chronic systolic dysfunction of left ventricle    Diabetes mellitus without complication (HCC)    on oral meds   GERD (gastroesophageal reflux disease)    Headache(784.0)    migraines, hasn't had one for over a year   History of noncompliance with medical treatment    Hypercholesteremia    Hypertension    LVH (left ventricular hypertrophy)    Nonischemic cardiomyopathy (HCC)    Polysubstance abuse (HCC)    Tobacco abuse disorder    Past Surgical History:  Procedure Laterality Date   ABDOMINAL HYSTERECTOMY     ABDOMINAL SURGERY     left foot surgery     STRABISMUS SURGERY Left 07/31/2013   Procedure: REPAIR STRABISMUS;  Surgeon: Elsie MALVA Salt, MD;  Location[facility] OR;  Service: Ophthalmology;  Laterality: Left;  EYE MUSCLE SURGERY LEFT EYE   UTERINE FIBROID SURGERY     Patient Active Problem List   Diagnosis Date Noted   CKD (chronic kidney disease) 06/28/2018   Chronic systolic CHF (congestive heart failure) (HCC) 06/28/2018   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 06/28/2018   Nephropathy associated with another disease 06/28/2018   Lumbago 06/28/2018   Vitamin D  deficiency, unspecified 06/28/2018   Routine general medical examination at a health care facility 06/28/2018   Dry eye  syndrome 06/28/2018   Imbalance 10/04/2017   Diabetes (HCC) 08/09/2016   Left acoustic neuroma (HCC) 10/03/2015   Essential hypertension 03/29/2012   Chronic systolic dysfunction of left ventricle 11/24/2011   ACS (acute coronary syndrome) (HCC) 11/23/2011   CHF, acute (HCC) 11/23/2011   Cocaine abuse (HCC) 11/23/2011   Tobacco abuse 11/23/2011   Pneumonitis, aspiration (HCC) 11/23/2011   Cardiomyopathy (HCC) 11/23/2011   HTN (hypertension), malignant 11/23/2011    PCP: Benjamine Aland, MD  REFERRING PROVIDER: Benjamine Aland, MD  REFERRING DIAG: R29.6 (ICD-10-CM) - Repeated falls  Rationale for Evaluation and Treatment: Rehabilitation  THERAPY DIAG:  Other abnormalities of gait and mobility  Muscle weakness (generalized)  Other low back pain  ONSET DATE: chronic  SUBJECTIVE:  SUBJECTIVE STATEMENT: No falls or LOB to note.  Feels she is steady on her feet.    PERTINENT HISTORY:  None current  PAIN:  Are you having pain? No  PRECAUTIONS: Fall  RED FLAGS: None   WEIGHT BEARING RESTRICTIONS: No  FALLS:  Has patient fallen in last 6 months? Yes. Number of falls 2  OCCUPATION: retired  PLOF: Independent  PATIENT GOALS: To prevent falls  NEXT MD VISIT: TBD  OBJECTIVE:  Note: Objective measures were completed at Evaluation unless otherwise noted.  DIAGNOSTIC FINDINGS:  IMPRESSION: 1. Generalized lumbar spine degeneration that is progressed from a 2008 comparison. New mild anterolisthesis at L4-5, facet mediated. 2. Up to moderate foraminal narrowing on the left at L5-S1. Up to mild spinal stenosis at L4-5.     Electronically Signed   By: Dorn Roulette M.D.   On: 06/03/2023 18:12  PATIENT SURVEYS:  LEFS 66/80 05/24/24 77/80  MUSCLE LENGTH: Hamstrings: WNL  POSTURE:  wide BOS, B knee varus, pes cavus B  PALPATION: unremarkable  LUMBAR ROM: N/T  AROM eval  Flexion   Extension   Right lateral flexion   Left lateral flexion   Right rotation   Left rotation    (Blank rows = not tested)  LOWER EXTREMITY ROM:     A/PROM Right eval Left eval  Hip flexion    Hip extension    Hip abduction    Hip adduction    Hip internal rotation    Hip external rotation    Knee flexion    Knee extension    Ankle dorsiflexion 10/15 10/15  Ankle plantarflexion    Ankle inversion    Ankle eversion     (Blank rows = not tested)  LOWER EXTREMITY MMT:    MMT Right eval Left eval  Hip flexion    Hip extension    Hip abduction    Hip adduction    Hip internal rotation    Hip external rotation    Knee flexion    Knee extension    Ankle dorsiflexion 4 4-  Ankle plantarflexion 4+ 4+  Ankle inversion    Ankle eversion     (Blank rows = not tested)  LUMBAR SPECIAL TESTS:  Straight leg raise test: Negative and Slump test: Negative  FUNCTIONAL TESTS:  30 seconds chair stand test  GAIT: Distance walked: 46ftx2 Assistive device utilized: None Level of assistance: Complete Independence Comments: limited trunk/pelvic rotation  TREATMENT: [facility] Adult PT Treatment:                                                DATE: 05/24/24 Therapeutic Exercise: Nustep L6 8 min  Neuromuscular re-ed:   05/24/24 0001  Functional Gait  Assessment  Gait assessed  Yes  Gait Level Surface 3  Change in Gait Speed 3  Gait with Horizontal Head Turns 3  Gait with Vertical Head Turns 3  Gait and Pivot Turn 3  Step Over Obstacle 3  Gait with Narrow Base of Support 3  Gait with Eyes Closed 2  Ambulating Backwards 2  Steps 3  Total Score 28   Therapeutic Activity: Supine hip fallouts BluTB 15x Bridge against BluTB 15x S/L clams BluTB 15x  [facility] Adult PT Treatment:  DATE: 05/17/24 Therapeutic Exercise: Nustep L4 8  min Neuromuscular re-ed: Supine hip fallouts GTB 15x B, 15/15 unilaterally S/L clams GTB 15/15 Bridge against GTB 15x Runners step 8 in 10/10 5# KB STS 10x from airex pad Therapeutic Activity: Quadruped UE 10/10 Quadruped LE 10/10 Bird dog 10/10 Heel raises from 4 in step 15x Side stepping 3 trips against Yellow band    [facility] Adult PT Treatment:                                                DATE: 05/02/24 Eval and HEP Self Care: Additional minutes spent for educating on updated Therapeutic Home Exercise Program as well as comparing current status to condition at start of symptoms. This included exercises focusing on stretching, strengthening, with focus on eccentric aspects. Long term goals include an improvement in range of motion, strength, endurance as well as avoiding reinjury. Patient's frequency would include in 1-2 times a day, 3-5 times a week for a duration of 6-12 weeks. Proper technique shown and discussed handout in great detail. All questions were discussed and addressed.                                                                                                                                   PATIENT EDUCATION:  Education details: Discussed eval findings, rehab rationale and POC and patient is in agreement  Person educated: Patient Education method: Explanation and Handouts Education comprehension: verbalized understanding and needs further education  HOME EXERCISE PROGRAM: Access Code: [email or web address] URL: [email or web address] Date: 05/02/2024 Prepared by: Reyes Kohut  Exercises - Single Leg Stance with Support  - 2 x daily - 5 x weekly - 1 sets - 30s hold - Toe Raises with Counter Support  - 2 x daily - 5 x weekly - 1 sets - 15 reps  ASSESSMENT:  CLINICAL IMPRESSION: FGA does not show any dynamic balance deficits, no falls or LOB noted since starting PT  Patient is a 63 y.o. female who was seen today for physical therapy evaluation and  treatment for frequent falls and balance assessment. Patient scores 50/56 on BBT, demonstrates mild strength and ROM deficits in DF and presents with postural changes of B knee varus and pes cavus.  LOB noted with SLS stand tasks as well as when in a narrow BOS position  OBJECTIVE IMPAIRMENTS: Abnormal gait, decreased balance, decreased mobility, difficulty walking, decreased ROM, decreased strength, impaired flexibility, and postural dysfunction.   ACTIVITY LIMITATIONS: stairs and locomotion level  PERSONAL FACTORS: Age, Behavior pattern, Fitness, Past/current experiences, and Time since onset of injury/illness/exacerbation are also affecting patient's functional outcome.   REHAB POTENTIAL: Fair based on degenerative changes   CLINICAL DECISION MAKING: Stable/uncomplicated  EVALUATION COMPLEXITY: Low   GOALS: Goals reviewed with patient? No    SHORT  TERM GOALS=LONG TERM GOALS: Target date: 07/03/24  Assess dynamic balance to identify deficits Baseline: FGA 28  Goal status: Met  2.  Patient to demonstrate independence in HEP Baseline: [id number] Goal status: Partially met  3.  Patient will score at least 74/80 on LEFS to signify clinically meaningful improvement in functional abilities.   Baseline: 66/80; 05/24/24 77/80 Goal status: Met  4.  Increase AROM DF to 15d Baseline: 10d; 05/24/24 15d  Goal status: Met    PLAN:  PT FREQUENCY: 1-2x/week  PT DURATION: 6 weeks  PLANNED INTERVENTIONS: 97110-Therapeutic exercises, 97530- Therapeutic activity, 97112- Neuromuscular re-education, 97535- Self Care, 02859- Manual therapy, [phone]- Gait training, Patient/Family education, Balance training, Stair training, and Spinal mobilization.  PLAN FOR NEXT SESSION: HEP review and update, manual techniques as appropriate, aerobic tasks, ROM and flexibility activities, strengthening and PREs, TPDN, gait and balance training as needed    Date of referral: 04/23/24 Referring provider: Benjamine Aland, MD Referring diagnosis? R29.6 (ICD-10-CM) - Repeated falls Treatment diagnosis? (if different than referring diagnosis) frequent falls  What was this (referring dx) caused by? Unspecified  Nature of Condition: Chronic (continuous duration > 3 months)   Laterality: Both  Current Functional Measure Score: LEFS 66/80  Objective measurements identify impairments when they are compared to normal values, the uninvolved extremity, and prior level of function.  [x]  Yes  []  No  Objective assessment of functional ability: Minimal functional limitations   Briefly describe symptoms: LOB noted with SLS stand tasks as well as when in a narrow BOS position  How did symptoms start: undetermined  Average pain intensity:  Last 24 hours: 0  Past week: 0  How often does the pt experience symptoms? Occasionally  How much have the symptoms interfered with usual daily activities? A little bit  How has condition changed since care began at this facility? NA - initial visit  In general, how is the patients overall health? Very Good   BACK PAIN (STarT Back Screening Tool) No   PHYSICAL THERAPY DISCHARGE SUMMARY  Visits from Start of Care: 3  Current functional level related to goals / functional outcomes: Goals met   Remaining deficits: none   Education / Equipment: HEP   Patient agrees to discharge. Patient goals were met. Patient is being discharged due to being pleased with the current functional level.   Kanna Dafoe M Jorene Kaylor, PT 05/24/2024, 4:52 PM

## 2024-05-24 ENCOUNTER — Ambulatory Visit

## 2024-05-24 DIAGNOSIS — M6281 Muscle weakness (generalized): Secondary | ICD-10-CM

## 2024-05-24 DIAGNOSIS — M5459 Other low back pain: Secondary | ICD-10-CM | POA: Diagnosis not present

## 2024-05-24 DIAGNOSIS — R2689 Other abnormalities of gait and mobility: Secondary | ICD-10-CM | POA: Diagnosis not present

## 2024-05-25 DIAGNOSIS — M25522 Pain in left elbow: Secondary | ICD-10-CM | POA: Diagnosis not present

## 2024-05-31 ENCOUNTER — Ambulatory Visit

## 2024-06-01 DIAGNOSIS — S46312A Strain of muscle, fascia and tendon of triceps, left arm, initial encounter: Secondary | ICD-10-CM | POA: Diagnosis not present

## 2024-06-07 ENCOUNTER — Encounter

## 2024-07-14 ENCOUNTER — Encounter (HOSPITAL_BASED_OUTPATIENT_CLINIC_OR_DEPARTMENT_OTHER): Payer: Self-pay

## 2024-07-14 ENCOUNTER — Emergency Department (HOSPITAL_BASED_OUTPATIENT_CLINIC_OR_DEPARTMENT_OTHER)

## 2024-07-14 ENCOUNTER — Emergency Department (HOSPITAL_BASED_OUTPATIENT_CLINIC_OR_DEPARTMENT_OTHER)
Admission: EM | Admit: 2024-07-14 | Discharge: 2024-07-14 | Disposition: A | Discharge status: Home / Self Care | Attending: Emergency Medicine | Admitting: Emergency Medicine

## 2024-07-14 ENCOUNTER — Other Ambulatory Visit: Payer: Self-pay

## 2024-07-14 DIAGNOSIS — W01198A Fall on same level from slipping, tripping and stumbling with subsequent striking against other object, initial encounter: Secondary | ICD-10-CM | POA: Diagnosis not present

## 2024-07-14 DIAGNOSIS — Y9301 Activity, walking, marching and hiking: Secondary | ICD-10-CM | POA: Insufficient documentation

## 2024-07-14 DIAGNOSIS — S29001A Unspecified injury of muscle and tendon of front wall of thorax, initial encounter: Secondary | ICD-10-CM | POA: Diagnosis present

## 2024-07-14 DIAGNOSIS — M19022 Primary osteoarthritis, left elbow: Secondary | ICD-10-CM | POA: Diagnosis not present

## 2024-07-14 DIAGNOSIS — R0789 Other chest pain: Secondary | ICD-10-CM | POA: Diagnosis not present

## 2024-07-14 DIAGNOSIS — I509 Heart failure, unspecified: Secondary | ICD-10-CM | POA: Diagnosis not present

## 2024-07-14 DIAGNOSIS — S20212A Contusion of left front wall of thorax, initial encounter: Secondary | ICD-10-CM | POA: Diagnosis not present

## 2024-07-14 DIAGNOSIS — I517 Cardiomegaly: Secondary | ICD-10-CM | POA: Diagnosis not present

## 2024-07-14 DIAGNOSIS — S59902A Unspecified injury of left elbow, initial encounter: Secondary | ICD-10-CM | POA: Diagnosis not present

## 2024-07-14 MED ORDER — LIDOCAINE 5 % EX PTCH
1.0000 | MEDICATED_PATCH | CUTANEOUS | Status: DC
  Administered 2024-07-14: 1 via TRANSDERMAL
  Filled 2024-07-14: qty 1

## 2024-07-14 MED ORDER — OXYCODONE-ACETAMINOPHEN 5-325 MG PO TABS
1.0000 | ORAL_TABLET | Freq: Once | ORAL | Status: AC
  Administered 2024-07-14: 1 via ORAL
  Filled 2024-07-14: qty 1

## 2024-07-14 MED ORDER — OXYCODONE HCL 5 MG PO TABS: 5.0000 mg | ORAL_TABLET | ORAL | 0 refills | Status: DC | PRN

## 2024-07-14 NOTE — Discharge Instructions (Signed)
 You were seen for your bruised ribs in the emergency department.  The x-rays did not show any broken bones of your ribs or elbow.  At home, please use Tylenol  and over-the-counter lidocaine  patches for your pain. You may also take the oxycodone  we have prescribed you for any breakthrough pain that may have.  Do not take this before driving or operating heavy machinery.  Do not take this medication with alcohol.  Use the incentive spirometer that we have given you every 2 hours  Check your MyChart online for the results of any tests that had not resulted by the time you left the emergency department.   Follow-up with your primary doctor in 2-3 days regarding your visit.    Return immediately to the emergency department if you experience any of the following: Worsening pain, difficulty breathing, or any other concerning symptoms.    Thank you for visiting our Emergency Department. It was a pleasure taking care of you today.

## 2024-07-14 NOTE — ED Notes (Signed)
 Incentive explained and pt completed 1750 x 5 with some pain

## 2024-07-14 NOTE — ED Triage Notes (Signed)
 Arrives ambulatory to the ED with complaints of left breast pain related to having 2 mechanical fall this morning while walking in the woods. No head injuries or LOC. No blood thinners.

## 2024-07-14 NOTE — ED Provider Notes (Signed)
 [name] EMERGENCY DEPARTMENT AT [facility] Provider Note   CSN: [id number] Arrival date & time: 07/14/24  1416     Patient presents with: Fall and Breast Pain (left)   [name] is a 63 y.o. female.   63 year old female with a history of CHF who presents to the emergency department with left rib pain.  Patient reports that she was walking in the woods with her dog when she tripped and fell over her stump and hit the left side of her ribs.  Also reports a second fall and hitting her left elbow.  No head strike or LOC.  No preceding symptoms.  Says it hurts to breathe so decided to come in to make sure she did not have a broken rib       Prior to Admission medications   Medication Sig Start Date End Date Taking? Authorizing Provider  oxyCODONE  (ROXICODONE ) 5 MG immediate release tablet Take 1 tablet (5 mg total) by mouth every 4 (four) hours as needed for severe pain (pain score 7-10). 07/14/24  Yes Yolande Lamar BROCKS, MD  amLODipine (NORVASC) 5 MG tablet Take 5 mg by mouth daily. 06/09/22   [provider]  aspirin  81 MG EC tablet Take 81 mg by mouth daily. Swallow whole. Patient not taking: Reported on 09/08/2022    [provider]  atorvastatin  (LIPITOR) 40 MG tablet Take 1 tablet (40 mg total) by mouth daily at 6 PM. 02/15/19   Jarold Medici, MD  baclofen (LIORESAL) 10 MG tablet TAKE 1 TABLET BY MOUTH TWO  TIMES DAILY AS NEEDED Patient not taking: Reported on 09/08/2022 01/16/19   Jarold Medici, MD  carvedilol  (COREG ) 25 MG tablet Take 1 tablet (25 mg total) by mouth 2 (two) times daily with a meal. 10/16/18   Jarold Medici, MD  Cholecalciferol (VITAMIN D3) 2000 units TABS Take 2,000 Units/day by mouth every morning.    [provider]  CHOLINE-INOSITOL-METHIONINE IM Inject into the muscle. Patient not taking: Reported on 09/08/2022    [provider]  citalopram (CELEXA) 40 MG tablet Take 40 mg by mouth daily. Patient not taking:  Reported on 09/08/2022 07/25/22   [provider]  CLENPIQ 10-3.5-12 MG-GM -GM/175ML SOLN SMARTSIG:175 Milliliter(s) By Mouth Twice Daily Patient not taking: Reported on 09/08/2022 07/29/22   [provider]  COVID-19 mRNA Vac-TriS, Pfizer, (PFIZER-BIONT COVID-19 VAC-TRIS) SUSP injection Inject into the muscle. Patient not taking: Reported on 09/08/2022 04/09/21   Luiz Channel, MD  COVID-19 mRNA vaccine [phone] (COMIRNATY ) SUSP injection Inject into the muscle. Patient not taking: Reported on 09/08/2022 08/13/22     famotidine  (PEPCID ) 20 MG tablet Take 1 tablet (20 mg total) by mouth 2 (two) times daily. 01/07/23   Desiderio Chew, PA-C  FARXIGA  5 MG TABS tablet  03/21/19   [provider]  FARXIGA  5 MG TABS tablet TAKE 1 TABLET BY MOUTH EVERY DAY 09/05/19   Jarold Medici, MD  folic acid  (FOLVITE ) 1 MG tablet Take 1 tablet (1 mg total) by mouth daily. 08/21/18   Jarold Medici, MD  furosemide  (LASIX ) 40 MG tablet Take by mouth. Patient not taking: Reported on 09/08/2022    [provider]  gabapentin  (NEURONTIN ) 300 MG capsule Take 300 mg by mouth 2 (two) times daily. Patient not taking: Reported on 09/08/2022    [provider]  gabapentin  (NEURONTIN ) 300 MG capsule Take 1 capsule (300 mg total) by mouth at bedtime. 08/24/23   Sikora, Rebecca, DPM  lisinopril  (PRINIVIL ,ZESTRIL ) 20  MG tablet Take 1 tablet (20 mg total) by mouth daily. Patient taking differently: Take 20 mg by mouth 2 (two) times daily. 02/15/19   Jarold Medici, MD  LORazepam  (ATIVAN ) 0.5 MG tablet 1 tab po q 4-6 hours prn or 1 tab po 30 minutes prior to radiation Patient not taking: Reported on 09/08/2022 05/22/19   Lanell Donald Stagger, PA-C  losartan (COZAAR) 100 MG tablet Take 1 tablet by mouth daily. Patient not taking: Reported on 09/08/2022 02/02/21   [provider]  magnesium  oxide (MAG-OX) 400 (241.3 Mg) MG tablet Take 1 tablet (400 mg total) by mouth daily. Patient not  taking: Reported on 09/08/2022 02/15/19   Jarold Medici, MD  meloxicam (MOBIC) 15 MG tablet Take 15 mg by mouth daily.    [provider]  metoCLOPramide  (REGLAN ) 10 MG tablet Take 1 tablet (10 mg total) by mouth every 6 (six) hours. 01/07/23   Geiple, Joshua, PA-C  mupirocin  ointment (BACTROBAN ) 2 % Apply 1 application topically 3 (three) times daily. 03/21/21   Arloa Suzen RAMAN, NP  nitrofurantoin , macrocrystal-monohydrate, (MACROBID ) 100 MG capsule Take 1 capsule (100 mg total) by mouth 2 (two) times daily. Patient not taking: Reported on 09/08/2022 06/21/19   Constant, Peggy, MD  Nutritional Supplements (L-GLUTAMINE/CHOLINE/INOSITOL PO) Take by mouth. Patient not taking: Reported on 09/08/2022    [provider]  omeprazole (PRILOSEC) 40 MG capsule Take 40 mg by mouth daily.    [provider]  promethazine  (PHENERGAN ) 25 MG suppository Place 1 suppository (25 mg total) rectally every 6 (six) hours as needed for nausea or vomiting. 08/05/22   Jarold Olam HERO, PA-C  pyridOXINE (VITAMIN B-6) 100 MG tablet Take 100 mg by mouth daily.    [provider]  silver  sulfADIAZINE  (SILVADENE ) 1 % cream Apply 1 application topically daily. Patient not taking: Reported on 09/08/2022 06/29/21   Ward, Harlene PEDLAR, PA-C  spironolactone  (ALDACTONE ) 25 MG tablet Take 25 mg by mouth daily.    [provider]  SYMBICORT 160-4.5 MCG/ACT inhaler SMARTSIG:2 Puff(s) By Mouth Twice Daily 02/27/21   [provider]    Allergies: Codeine    Review of Systems  Updated Vital Signs BP (!) 150/108 (BP Location: Right Arm)   Pulse 70   Temp (!) 97.5 F (36.4 C)   Resp 16   Ht 5' 3 (1.6 m)   Wt 62.6 kg   SpO2 100%   BMI 24.45 kg/m   Physical Exam Vitals and nursing note reviewed.  Constitutional:      General: She is not in acute distress.    Appearance: She is well-developed.  HENT:     Head: Normocephalic and atraumatic.     Right Ear: External ear normal.      Left Ear: External ear normal.     Nose: Nose normal.  Eyes:     Extraocular Movements: Extraocular movements intact.     Conjunctiva/sclera: Conjunctivae normal.     Pupils: Pupils are equal, round, and reactive to light.  Neck:     Comments: No C-spine midline tenderness to palpation Cardiovascular:     Rate and Rhythm: Normal rate and regular rhythm.     Heart sounds: No murmur heard. Pulmonary:     Effort: Pulmonary effort is normal. No respiratory distress.     Breath sounds: Normal breath sounds.  Musculoskeletal:     Cervical back: Normal range of motion and neck supple.     Right lower leg: No edema.  Left lower leg: No edema.     Comments: No tenderness to palpation of bilateral shoulders or wrist.  Tenderness palpation of left elbow with an effusion that she reports is chronic.  No tenderness to palpation of bilateral hips, knees, or ankles.  Tenderness to palpation of left chest wall.  No overlying bruising noted.  Skin:    General: Skin is warm and dry.  Neurological:     Mental Status: She is alert and oriented to person, place, and time. Mental status is at baseline.     Cranial Nerves: No cranial nerve deficit.     Sensory: No sensory deficit.     Motor: No weakness.  Psychiatric:        Mood and Affect: Mood normal.     (all labs ordered are listed, but only abnormal results are displayed) Labs Reviewed - No data to display  EKG: None  Radiology: DG Elbow Complete Left Result Date: 07/14/2024 CLINICAL DATA:  Fall and trauma to the left elbow. EXAM: LEFT ELBOW - COMPLETE 3+ VIEW COMPARISON:  None Available. FINDINGS: No acute fracture or dislocation. There is mild degenerative changes of the left elbow. No joint effusion. Mild thickening of the soft tissues of the dorsal elbow may represent olecranon bursitis. Clinical correlation is recommended. IMPRESSION: 1. No acute fracture or dislocation. 2. Possible olecranon bursitis. Electronically Signed   By:  Vanetta Chou M.D.   On: 07/14/2024 16:36   DG Ribs Unilateral W/Chest Left Result Date: 07/14/2024 CLINICAL DATA:  Fall and left chest wall pain. EXAM: LEFT RIBS AND CHEST - 3+ VIEW COMPARISON:  Chest radiograph dated 02/01/2023 FINDINGS: The lungs are clear. There is no pleural effusion or pneumothorax. Mild cardiomegaly. Degenerative changes of spine. No acute osseous pathology. No displaced rib fractures. IMPRESSION: Negative. Electronically Signed   By: Vanetta Chou M.D.   On: 07/14/2024 16:35     Procedures   Medications Ordered in the ED  oxyCODONE -acetaminophen  (PERCOCET/ROXICET) 5-325 MG per tablet 1 tablet (1 tablet Oral Given 07/14/24 1652)                                    Medical Decision Making Amount and/or Complexity of Data Reviewed Radiology: ordered.  Risk Prescription drug management.   [name] is a 63 y.o. female with comorbidities that complicate the patient evaluation including CHF who presents with left rib and left elbow pain after a fall  Initial Ddx:  Bruised rib, rib fracture, pneumonia, pneumothorax  MDM:  The patient presents to the emergency department with rib and elbow pain after a mechanical fall.  On exam does appear to be uncomfortable but is satting well on room air.  Does have an effusion of the left elbow which is chronic.  Concerned about possible rib fractures so will obtain x-rays at this time.  Plan:  X-ray ribs Elbow x-ray Lidocaine  patch P.o. pain medication  ED Summary/Re-evaluation:  Patient had x-rays which showed no broken ribs.  No pneumothorax or pneumonia.  No elbow fracture.  Pain under control here in the emergency department.  Given an incentive spirometer, pain medication, and instructed to follow-up with her primary doctor in several days.  This patient presents to the ED for concern of complaints listed in HPI, this involves an extensive number of treatment options, and is a complaint that carries with  it a high risk of complications and morbidity. Disposition including  potential need for admission considered.   Dispo: DC Home. Return precautions discussed including, but not limited to, those listed in the AVS. Allowed pt time to ask questions which were answered fully prior to dc.  Records reviewed Outpatient Clinic Notes I independently reviewed the following imaging with scope of interpretation limited to determining acute life threatening conditions related to emergency care: Chest x-ray and agree with the radiologist interpretation with the following exceptions: none I have reviewed the patients home medications and made adjustments as needed Social Determinants of health:  Geriatric   Final diagnoses:  Contusion of rib on left side, initial encounter    ED Discharge Orders          Ordered    oxyCODONE  (ROXICODONE ) 5 MG immediate release tablet  Every 4 hours PRN        07/14/24 1733               Yolande Lamar BROCKS, MD 07/14/24 2330

## 2024-07-14 NOTE — ED Notes (Signed)
 RN reviewed discharge instructions with pt. Pt verbalized understanding and had no further questions. VSS upon discharge.

## 2024-07-16 DIAGNOSIS — R0789 Other chest pain: Secondary | ICD-10-CM | POA: Diagnosis not present

## 2024-07-16 DIAGNOSIS — I10 Essential (primary) hypertension: Secondary | ICD-10-CM | POA: Diagnosis not present

## 2024-07-16 DIAGNOSIS — W010XXA Fall on same level from slipping, tripping and stumbling without subsequent striking against object, initial encounter: Secondary | ICD-10-CM | POA: Diagnosis not present

## 2024-07-17 DIAGNOSIS — I10 Essential (primary) hypertension: Secondary | ICD-10-CM | POA: Diagnosis not present

## 2024-07-17 DIAGNOSIS — K219 Gastro-esophageal reflux disease without esophagitis: Secondary | ICD-10-CM | POA: Diagnosis not present

## 2024-07-20 DIAGNOSIS — I5022 Chronic systolic (congestive) heart failure: Secondary | ICD-10-CM | POA: Diagnosis not present

## 2024-07-20 DIAGNOSIS — I10 Essential (primary) hypertension: Secondary | ICD-10-CM | POA: Diagnosis not present

## 2024-07-20 DIAGNOSIS — E119 Type 2 diabetes mellitus without complications: Secondary | ICD-10-CM | POA: Diagnosis not present

## 2024-07-20 DIAGNOSIS — E782 Mixed hyperlipidemia: Secondary | ICD-10-CM | POA: Diagnosis not present

## 2024-07-25 DIAGNOSIS — M25512 Pain in left shoulder: Secondary | ICD-10-CM | POA: Diagnosis not present

## 2024-07-30 DIAGNOSIS — S40212A Abrasion of left shoulder, initial encounter: Secondary | ICD-10-CM | POA: Diagnosis not present

## 2024-07-30 DIAGNOSIS — W108XXA Fall (on) (from) other stairs and steps, initial encounter: Secondary | ICD-10-CM | POA: Diagnosis not present

## 2024-07-30 DIAGNOSIS — I10 Essential (primary) hypertension: Secondary | ICD-10-CM | POA: Diagnosis not present

## 2024-07-30 DIAGNOSIS — E1169 Type 2 diabetes mellitus with other specified complication: Secondary | ICD-10-CM | POA: Diagnosis not present

## 2024-08-06 ENCOUNTER — Encounter (HOSPITAL_BASED_OUTPATIENT_CLINIC_OR_DEPARTMENT_OTHER): Payer: Self-pay | Admitting: Orthopedic Surgery

## 2024-08-06 ENCOUNTER — Other Ambulatory Visit: Payer: Self-pay

## 2024-08-07 NOTE — H&P (Signed)
 PREOPERATIVE H&P  Chief Complaint: LEFT ELBOW ULNAR NERVE LESION, TRICEPS TENDON RUPTURE, OLECRANON BURSITIS  HPI: [name] is a 63 y.o. female who presents with a diagnosis of LEFT ELBOW ULNAR NERVE LESION, TRICEPS TENDON RUPTURE, OLECRANON BURSITIS. Symptoms are rated as moderate to severe, and have been worsening.  This is significantly impairing activities of daily living.  She has elected for surgical management.   Past Medical History:  Diagnosis Date   Arthritis    Bowel obstruction (HCC)    Bronchitis    CHF (congestive heart failure) (HCC)    Chronic systolic dysfunction of left ventricle    Diabetes mellitus without complication (HCC)    on oral meds   GERD (gastroesophageal reflux disease)    Headache(784.0)    migraines, hasn't had one for over a year   History of noncompliance with medical treatment    Hypercholesteremia    Hypertension    LVH (left ventricular hypertrophy)    Nonischemic cardiomyopathy (HCC)    Polysubstance abuse (HCC)    Tobacco abuse disorder    Past Surgical History:  Procedure Laterality Date   ABDOMINAL HYSTERECTOMY     ABDOMINAL SURGERY     left foot surgery     STRABISMUS SURGERY Left 07/31/2013   Procedure: REPAIR STRABISMUS;  Surgeon: Elsie MALVA Salt, MD;  Location: [facility] OR;  Service: Ophthalmology;  Laterality: Left;  EYE MUSCLE SURGERY LEFT EYE   UTERINE FIBROID SURGERY     Social History   Socioeconomic History   Marital status: Single    Spouse name: Not on file   Number of children: 0   Years of education: 60   Highest education level: Not on file  Occupational History   Occupation: N/A  Tobacco Use   Smoking status: Some Days    Current packs/day: 0.00    Average packs/day: 0.5 packs/day for 30.0 years (15.0 ttl pk-yrs)    Types: Cigarettes    Start date: 07/02/1982    Last attempt to quit: 07/02/2012    Years since quitting: 12.1   Smokeless tobacco: Never   Tobacco comments:    she is not ready to quit  Vaping  Use   Vaping status: Never Used  Substance and Sexual Activity   Alcohol use: Yes    Comment: last night   Drug use: Yes    Types: Marijuana   Sexual activity: Not Currently    Birth control/protection: Surgical  Other Topics Concern   Not on file  Social History Narrative   Lives in [place]   Disabled   Occasional caffeine use    Social Drivers of Health   Financial Resource Strain: Not on file  Food Insecurity: No Food Insecurity (04/24/2019)   Hunger Vital Sign    Worried About Running Out of Food in the Last Year: Never true    Ran Out of Food in the Last Year: Never true  Transportation Needs: No Transportation Needs (04/24/2019)   PRAPARE - Administrator, Civil Service (Medical): No    Lack of Transportation (Non-Medical): No  Physical Activity: Insufficiently Active (04/24/2019)   Exercise Vital Sign    Days of Exercise per Week: 2 days    Minutes of Exercise per Session: 60 min  Stress: No Stress Concern Present (04/24/2019)   [employer] of Occupational Health - Occupational Stress Questionnaire    Feeling of Stress : Not at all  Social Connections: Not on file   Family History  Problem Relation  Age of Onset   Diabetes Mother    Hypertension Mother    Heart disease Mother    Heart disease Father    Diabetes Father    Hypertension Father    Breast cancer Neg Hx    Allergies  Allergen Reactions   Codeine Nausea And Vomiting   Prior to Admission medications   Medication Sig Start Date End Date Taking? Authorizing Provider  amLODipine (NORVASC) 5 MG tablet Take 5 mg by mouth daily. 06/09/22  Yes [provider]  atorvastatin  (LIPITOR) 40 MG tablet Take 1 tablet (40 mg total) by mouth daily at 6 PM. 02/15/19  Yes Jarold Medici, MD  carvedilol  (COREG ) 25 MG tablet Take 1 tablet (25 mg total) by mouth 2 (two) times daily with a meal. 10/16/18  Yes Jarold Medici, MD  Cholecalciferol (VITAMIN D3) 2000 units TABS Take 2,000 Units/day by  mouth every morning.   Yes [provider]  FARXIGA  5 MG TABS tablet  03/21/19  Yes [provider]  folic acid  (FOLVITE ) 1 MG tablet Take 1 tablet (1 mg total) by mouth daily. 08/21/18  Yes Jarold Medici, MD  LORazepam  (ATIVAN ) 0.5 MG tablet 1 tab po q 4-6 hours prn or 1 tab po 30 minutes prior to radiation 05/22/19  Yes Lanell Donald Stagger, PA-C  losartan (COZAAR) 100 MG tablet Take 1 tablet by mouth daily. 02/02/21  Yes [provider]  meloxicam (MOBIC) 15 MG tablet Take 15 mg by mouth daily.   Yes [provider]  pyridOXINE (VITAMIN B-6) 100 MG tablet Take 100 mg by mouth daily.   Yes [provider]  spironolactone  (ALDACTONE ) 25 MG tablet Take 25 mg by mouth daily.   Yes [provider]  aspirin  81 MG EC tablet Take 81 mg by mouth daily. Swallow whole. Patient not taking: Reported on 09/08/2022    [provider]  baclofen (LIORESAL) 10 MG tablet TAKE 1 TABLET BY MOUTH TWO  TIMES DAILY AS NEEDED Patient not taking: Reported on 09/08/2022 01/16/19   Jarold Medici, MD  [facility] IM Inject into the muscle. Patient not taking: Reported on 09/08/2022    [provider]  citalopram (CELEXA) 40 MG tablet Take 40 mg by mouth daily. Patient not taking: Reported on 09/08/2022 07/25/22   [provider]  CLENPIQ 10-3.5-12 MG-GM -GM/175ML SOLN SMARTSIG:175 Milliliter(s) By Mouth Twice Daily Patient not taking: Reported on 09/08/2022 07/29/22   [provider]  COVID-19 mRNA Vac-TriS, Pfizer, (PFIZER-BIONT COVID-19 VAC-TRIS) SUSP injection Inject into the muscle. Patient not taking: Reported on 09/08/2022 04/09/21   Luiz Channel, MD  COVID-19 mRNA vaccine [phone] (COMIRNATY ) SUSP injection Inject into the muscle. Patient not taking: Reported on 09/08/2022 08/13/22     famotidine  (PEPCID ) 20 MG tablet Take 1 tablet (20 mg total) by mouth 2 (two) times daily. 01/07/23   Desiderio Chew, PA-C  FARXIGA  5  MG TABS tablet TAKE 1 TABLET BY MOUTH EVERY DAY 09/05/19   Jarold Medici, MD  furosemide  (LASIX ) 40 MG tablet Take by mouth. Patient not taking: Reported on 09/08/2022    [provider]  gabapentin  (NEURONTIN ) 300 MG capsule Take 300 mg by mouth 2 (two) times daily. Patient not taking: Reported on 09/08/2022    [provider]  gabapentin  (NEURONTIN ) 300 MG capsule Take 1 capsule (300 mg total) by mouth at bedtime. 08/24/23   Joya Stabs, DPM  lisinopril  (PRINIVIL ,ZESTRIL ) 20 MG tablet Take 1 tablet (20 mg total) by mouth daily. Patient taking differently:  Take 20 mg by mouth 2 (two) times daily. 02/15/19   Jarold Medici, MD  magnesium  oxide (MAG-OX) 400 (241.3 Mg) MG tablet Take 1 tablet (400 mg total) by mouth daily. 02/15/19   Jarold Medici, MD  metoCLOPramide  (REGLAN ) 10 MG tablet Take 1 tablet (10 mg total) by mouth every 6 (six) hours. 01/07/23   Desiderio Chew, PA-C  mupirocin  ointment (BACTROBAN ) 2 % Apply 1 application topically 3 (three) times daily. 03/21/21   Arloa Suzen RAMAN, NP  nitrofurantoin , macrocrystal-monohydrate, (MACROBID ) 100 MG capsule Take 1 capsule (100 mg total) by mouth 2 (two) times daily. Patient not taking: Reported on 09/08/2022 06/21/19   Constant, Peggy, MD  Nutritional Supplements (L-GLUTAMINE/CHOLINE/INOSITOL PO) Take by mouth. Patient not taking: Reported on 09/08/2022    [provider]  omeprazole (PRILOSEC) 40 MG capsule Take 40 mg by mouth daily.    [provider]  oxyCODONE  (ROXICODONE ) 5 MG immediate release tablet Take 1 tablet (5 mg total) by mouth every 4 (four) hours as needed for severe pain (pain score 7-10). 07/14/24   Yolande Lamar BROCKS, MD  promethazine  (PHENERGAN ) 25 MG suppository Place 1 suppository (25 mg total) rectally every 6 (six) hours as needed for nausea or vomiting. 08/05/22   Jarold Olam HERO, PA-C  silver  sulfADIAZINE  (SILVADENE ) 1 % cream Apply 1 application topically daily. Patient not taking:  Reported on 09/08/2022 06/29/21   Ward, Jessica Z, PA-C  SYMBICORT 160-4.5 MCG/ACT inhaler SMARTSIG:2 Puff(s) By Mouth Twice Daily 02/27/21   [provider]     Positive ROS: All other systems have been reviewed and were otherwise negative with the exception of those mentioned in the HPI and as above.  Physical Exam: General: Alert, no acute distress Cardiovascular: No pedal edema Respiratory: No cyanosis, no use of accessory musculature GI: No organomegaly, abdomen is soft and non-tender Skin: No lesions in the area of chief complaint Neurologic: Sensation intact distally Psychiatric: Patient is competent for consent with normal mood and affect Lymphatic: No axillary or cervical lymphadenopathy  MUSCULOSKELETAL: painful and limited ROM left elbow especially extension, n/t from medial elbow to ring and small fingers, edema to olecranon   Imaging: MRI of the elbow shows a complete distal triceps tendon tear with 3 cm of retraction, olecranon bursitis, moderate tendinosis of the common extensor tendon origin with partial thickness tear 8mm x 7mm, and partial thickness cartilage loss to coronoid process   Assessment: LEFT ELBOW ULNAR NERVE LESION, TRICEPS TENDON RUPTURE, OLECRANON BURSITIS  Plan: Plan for Procedure(s): ULNAR NERVE DECOMPRESSION/TRANSPOSITION  The risks benefits and alternatives were discussed with the patient including but not limited to the risks of nonoperative treatment, versus surgical intervention including infection, bleeding, nerve injury,  blood clots, cardiopulmonary complications, morbidity, mortality, among others, and they were willing to proceed.   Weightbearing: WBAT vs NWB LUE Orthopedic devices: sling and possible splint Showering: POD 3 Dressing: reinforce PRN Medicines: ASA, Oxy, Tylenol , Mobic, Zofran   Discharge: home Follow up: 08/27/24 at 9:00am    Gerard HERO Ted DEVONNA Office [phone] 08/14/2024 12:56 PM

## 2024-08-13 ENCOUNTER — Encounter (HOSPITAL_BASED_OUTPATIENT_CLINIC_OR_DEPARTMENT_OTHER)
Admission: RE | Admit: 2024-08-13 | Discharge: 2024-08-13 | Disposition: A | Discharge status: Home / Self Care | Source: Ambulatory Visit | Attending: Orthopedic Surgery | Admitting: Orthopedic Surgery

## 2024-08-13 DIAGNOSIS — I428 Other cardiomyopathies: Secondary | ICD-10-CM | POA: Diagnosis not present

## 2024-08-13 DIAGNOSIS — I509 Heart failure, unspecified: Secondary | ICD-10-CM | POA: Diagnosis not present

## 2024-08-13 DIAGNOSIS — Z7951 Long term (current) use of inhaled steroids: Secondary | ICD-10-CM | POA: Diagnosis not present

## 2024-08-13 DIAGNOSIS — Z7982 Long term (current) use of aspirin: Secondary | ICD-10-CM | POA: Diagnosis not present

## 2024-08-13 DIAGNOSIS — F17210 Nicotine dependence, cigarettes, uncomplicated: Secondary | ICD-10-CM | POA: Diagnosis not present

## 2024-08-13 DIAGNOSIS — R519 Headache, unspecified: Secondary | ICD-10-CM | POA: Diagnosis not present

## 2024-08-13 DIAGNOSIS — Z01812 Encounter for preprocedural laboratory examination: Secondary | ICD-10-CM | POA: Diagnosis not present

## 2024-08-13 DIAGNOSIS — M7022 Olecranon bursitis, left elbow: Secondary | ICD-10-CM | POA: Diagnosis not present

## 2024-08-13 DIAGNOSIS — Z79899 Other long term (current) drug therapy: Secondary | ICD-10-CM | POA: Diagnosis not present

## 2024-08-13 DIAGNOSIS — Z6823 Body mass index (BMI) 23.0-23.9, adult: Secondary | ICD-10-CM | POA: Diagnosis not present

## 2024-08-13 DIAGNOSIS — S46312A Strain of muscle, fascia and tendon of triceps, left arm, initial encounter: Secondary | ICD-10-CM | POA: Diagnosis not present

## 2024-08-13 DIAGNOSIS — M1990 Unspecified osteoarthritis, unspecified site: Secondary | ICD-10-CM | POA: Diagnosis not present

## 2024-08-13 DIAGNOSIS — Z833 Family history of diabetes mellitus: Secondary | ICD-10-CM | POA: Diagnosis not present

## 2024-08-13 DIAGNOSIS — Z791 Long term (current) use of non-steroidal anti-inflammatories (NSAID): Secondary | ICD-10-CM | POA: Diagnosis not present

## 2024-08-13 DIAGNOSIS — G5622 Lesion of ulnar nerve, left upper limb: Secondary | ICD-10-CM | POA: Diagnosis not present

## 2024-08-13 DIAGNOSIS — Z7984 Long term (current) use of oral hypoglycemic drugs: Secondary | ICD-10-CM | POA: Diagnosis not present

## 2024-08-13 DIAGNOSIS — I110 Hypertensive heart disease with heart failure: Secondary | ICD-10-CM | POA: Diagnosis not present

## 2024-08-13 DIAGNOSIS — E669 Obesity, unspecified: Secondary | ICD-10-CM | POA: Diagnosis not present

## 2024-08-13 DIAGNOSIS — X58XXXA Exposure to other specified factors, initial encounter: Secondary | ICD-10-CM | POA: Diagnosis not present

## 2024-08-13 DIAGNOSIS — R0609 Other forms of dyspnea: Secondary | ICD-10-CM | POA: Diagnosis not present

## 2024-08-13 DIAGNOSIS — R0602 Shortness of breath: Secondary | ICD-10-CM | POA: Diagnosis not present

## 2024-08-13 DIAGNOSIS — E119 Type 2 diabetes mellitus without complications: Secondary | ICD-10-CM | POA: Diagnosis not present

## 2024-08-13 LAB — BASIC METABOLIC PANEL WITH GFR
Anion gap: 11 (ref 5–15)
BUN: 19 mg/dL (ref 8–23)
CO2: 23 mmol/L (ref 22–32)
Calcium: 9.7 mg/dL (ref 8.9–10.3)
Chloride: 107 mmol/L (ref 98–111)
Creatinine, Ser: 0.78 mg/dL (ref 0.44–1.00)
GFR, Estimated: >60 mL/min (ref >60)
Glucose, Bld: 133 mg/dL — ABNORMAL HIGH (ref 70–99)
Potassium: 4.2 mmol/L (ref 3.5–5.1)
Sodium: 141 mmol/L (ref 135–145)

## 2024-08-13 NOTE — Anesthesia Preprocedure Evaluation (Signed)
 Anesthesia Evaluation  Patient identified by MRN, date of birth, ID band Patient awake    Reviewed: Allergy & Precautions, H&P , NPO status , Patient's Chart, lab work & pertinent test results, reviewed documented beta blocker date and time   Airway Mallampati: II  TM Distance: >3 FB Neck ROM: Full    Dental  (+) Dental Advisory Given, Teeth Intact   Pulmonary shortness of breath and with exertion, Current Smoker and Patient abstained from smoking.   breath sounds clear to auscultation + decreased breath sounds      Cardiovascular hypertension, Pt. on home beta blockers and Pt. on medications +CHF and + DOE   Rhythm:Regular Rate:Normal + Systolic murmurs    Neuro/Psych  Headaches PSYCHIATRIC DISORDERS         GI/Hepatic ,GERD  Medicated and Controlled,,  Endo/Other  diabetes, Well Controlled, Type 2, Oral Hypoglycemic Agents    Renal/GU Renal disease     Musculoskeletal  (+) Arthritis ,    Abdominal  (+) - obese  Peds  Hematology   Anesthesia Other Findings   Reproductive/Obstetrics                              Anesthesia Physical Anesthesia Plan  ASA: 4  Anesthesia Plan: Regional   Post-op Pain Management: Tylenol  PO (pre-op)*   Induction: Intravenous  PONV Risk Score and Plan: 1 and Ondansetron , Treatment may vary due to age or medical condition, Propofol  infusion and TIVA  Airway Management Planned: Natural Airway  Additional Equipment:   Intra-op Plan:   Post-operative Plan:   Informed Consent: I have reviewed the patients History and Physical, chart, labs and discussed the procedure including the risks, benefits and alternatives for the proposed anesthesia with the patient or authorized representative who has indicated his/her understanding and acceptance.     Dental advisory given  Plan Discussed with: CRNA  Anesthesia Plan Comments: (Missed dose of farxiga  10/12,  taken yesterday, not taken today. Discussed normoglycemic ketoacidosis.  Risks of anesthesia explained at length. This includes, but is not limited to, sore throat, damage to teeth, lips gums, tongue and vocal cords, nausea and vomiting, reactions to medications, stroke, heart attack, and death. All patient questions were answered and the patient wishes to proceed. Risks of peripheral nerve block explained at length. This includes, but is not limited to, bleeding, infection, reactions to the medications, seizures, damage to surrounding structures, damage to nerves, permanent weakness, numbness, tingling and pain. All patient questions were answered and patient wishes to proceed with nerve block. )         Anesthesia Quick Evaluation

## 2024-08-13 NOTE — Progress Notes (Signed)

## 2024-08-14 ENCOUNTER — Ambulatory Visit (HOSPITAL_BASED_OUTPATIENT_CLINIC_OR_DEPARTMENT_OTHER): Admitting: Anesthesiology

## 2024-08-14 ENCOUNTER — Encounter (HOSPITAL_BASED_OUTPATIENT_CLINIC_OR_DEPARTMENT_OTHER): Payer: Self-pay | Admitting: Orthopedic Surgery

## 2024-08-14 ENCOUNTER — Ambulatory Visit (HOSPITAL_BASED_OUTPATIENT_CLINIC_OR_DEPARTMENT_OTHER)
Admission: RE | Admit: 2024-08-14 | Discharge: 2024-08-14 | Disposition: A | Discharge status: Home / Self Care | Attending: Orthopedic Surgery | Admitting: Orthopedic Surgery

## 2024-08-14 ENCOUNTER — Ambulatory Visit (HOSPITAL_BASED_OUTPATIENT_CLINIC_OR_DEPARTMENT_OTHER)

## 2024-08-14 ENCOUNTER — Encounter (HOSPITAL_BASED_OUTPATIENT_CLINIC_OR_DEPARTMENT_OTHER)
Admission: RE | Disposition: A | Discharge status: Home / Self Care | Payer: Self-pay | Source: Home / Self Care | Attending: Orthopedic Surgery

## 2024-08-14 ENCOUNTER — Other Ambulatory Visit: Payer: Self-pay

## 2024-08-14 DIAGNOSIS — Z7951 Long term (current) use of inhaled steroids: Secondary | ICD-10-CM | POA: Insufficient documentation

## 2024-08-14 DIAGNOSIS — M7022 Olecranon bursitis, left elbow: Secondary | ICD-10-CM | POA: Insufficient documentation

## 2024-08-14 DIAGNOSIS — I428 Other cardiomyopathies: Secondary | ICD-10-CM | POA: Insufficient documentation

## 2024-08-14 DIAGNOSIS — E119 Type 2 diabetes mellitus without complications: Secondary | ICD-10-CM | POA: Diagnosis not present

## 2024-08-14 DIAGNOSIS — S46312A Strain of muscle, fascia and tendon of triceps, left arm, initial encounter: Secondary | ICD-10-CM | POA: Diagnosis not present

## 2024-08-14 DIAGNOSIS — I110 Hypertensive heart disease with heart failure: Secondary | ICD-10-CM | POA: Diagnosis not present

## 2024-08-14 DIAGNOSIS — I509 Heart failure, unspecified: Secondary | ICD-10-CM

## 2024-08-14 DIAGNOSIS — R519 Headache, unspecified: Secondary | ICD-10-CM | POA: Diagnosis not present

## 2024-08-14 DIAGNOSIS — E1122 Type 2 diabetes mellitus with diabetic chronic kidney disease: Secondary | ICD-10-CM | POA: Diagnosis not present

## 2024-08-14 DIAGNOSIS — N189 Chronic kidney disease, unspecified: Secondary | ICD-10-CM

## 2024-08-14 DIAGNOSIS — R0602 Shortness of breath: Secondary | ICD-10-CM | POA: Diagnosis not present

## 2024-08-14 DIAGNOSIS — Z79899 Other long term (current) drug therapy: Secondary | ICD-10-CM | POA: Diagnosis not present

## 2024-08-14 DIAGNOSIS — Z7982 Long term (current) use of aspirin: Secondary | ICD-10-CM | POA: Insufficient documentation

## 2024-08-14 DIAGNOSIS — G5622 Lesion of ulnar nerve, left upper limb: Secondary | ICD-10-CM | POA: Diagnosis not present

## 2024-08-14 DIAGNOSIS — Z7984 Long term (current) use of oral hypoglycemic drugs: Secondary | ICD-10-CM | POA: Diagnosis not present

## 2024-08-14 DIAGNOSIS — X58XXXA Exposure to other specified factors, initial encounter: Secondary | ICD-10-CM | POA: Insufficient documentation

## 2024-08-14 DIAGNOSIS — F17210 Nicotine dependence, cigarettes, uncomplicated: Secondary | ICD-10-CM | POA: Diagnosis not present

## 2024-08-14 DIAGNOSIS — R0609 Other forms of dyspnea: Secondary | ICD-10-CM | POA: Diagnosis not present

## 2024-08-14 DIAGNOSIS — Z833 Family history of diabetes mellitus: Secondary | ICD-10-CM | POA: Diagnosis not present

## 2024-08-14 DIAGNOSIS — I5022 Chronic systolic (congestive) heart failure: Secondary | ICD-10-CM | POA: Diagnosis not present

## 2024-08-14 DIAGNOSIS — Z01818 Encounter for other preprocedural examination: Secondary | ICD-10-CM

## 2024-08-14 DIAGNOSIS — E669 Obesity, unspecified: Secondary | ICD-10-CM | POA: Insufficient documentation

## 2024-08-14 DIAGNOSIS — M1990 Unspecified osteoarthritis, unspecified site: Secondary | ICD-10-CM | POA: Diagnosis not present

## 2024-08-14 DIAGNOSIS — Z791 Long term (current) use of non-steroidal anti-inflammatories (NSAID): Secondary | ICD-10-CM | POA: Diagnosis not present

## 2024-08-14 DIAGNOSIS — Z6823 Body mass index (BMI) 23.0-23.9, adult: Secondary | ICD-10-CM | POA: Insufficient documentation

## 2024-08-14 DIAGNOSIS — I130 Hypertensive heart and chronic kidney disease with heart failure and stage 1 through stage 4 chronic kidney disease, or unspecified chronic kidney disease: Secondary | ICD-10-CM

## 2024-08-14 HISTORY — PX: ULNAR NERVE TRANSPOSITION: SHX2595

## 2024-08-14 LAB — GLUCOSE, CAPILLARY
Glucose-Capillary: 134 mg/dL — ABNORMAL HIGH (ref 70–99)
Glucose-Capillary: 126 mg/dL — ABNORMAL HIGH (ref 70–99)

## 2024-08-14 SURGERY — ULNAR NERVE DECOMPRESSION/TRANSPOSITION
Anesthesia: Monitor Anesthesia Care | Site: Elbow | Laterality: Left

## 2024-08-14 MED ORDER — CEFAZOLIN SODIUM-DEXTROSE 2-4 GM/100ML-% IV SOLN
2.0000 g | INTRAVENOUS | Status: AC
  Administered 2024-08-14: 2 g via INTRAVENOUS

## 2024-08-14 MED ORDER — PROPOFOL 10 MG/ML IV BOLUS
INTRAVENOUS | Status: DC | PRN
  Administered 2024-08-14 (×3): 25 mg via INTRAVENOUS
  Administered 2024-08-14: 50 mg via INTRAVENOUS
  Administered 2024-08-14 (×2): 25 mg via INTRAVENOUS
  Administered 2024-08-14: 50 mg via INTRAVENOUS

## 2024-08-14 MED ORDER — DEXAMETHASONE SODIUM PHOSPHATE 4 MG/ML IJ SOLN
INTRAMUSCULAR | Status: DC | PRN
  Administered 2024-08-14: 4 mg via PERINEURAL

## 2024-08-14 MED ORDER — CLONIDINE HCL (ANALGESIA) 100 MCG/ML EP SOLN
EPIDURAL | Status: DC | PRN
  Administered 2024-08-14: 60 ug

## 2024-08-14 MED ORDER — LIDOCAINE HCL (CARDIAC) PF 100 MG/5ML IV SOSY
PREFILLED_SYRINGE | INTRAVENOUS | Status: DC | PRN
  Administered 2024-08-14: 50 mg via INTRATRACHEAL

## 2024-08-14 MED ORDER — METOPROLOL TARTRATE 5 MG/5ML IV SOLN
INTRAVENOUS | Status: AC
  Filled 2024-08-14: qty 5

## 2024-08-14 MED ORDER — LIDOCAINE 2% (20 MG/ML) 5 ML SYRINGE
INTRAMUSCULAR | Status: AC
  Filled 2024-08-14: qty 5

## 2024-08-14 MED ORDER — ONDANSETRON HCL 4 MG/2ML IJ SOLN
INTRAMUSCULAR | Status: AC
  Filled 2024-08-14: qty 2

## 2024-08-14 MED ORDER — 0.9 % SODIUM CHLORIDE (POUR BTL) OPTIME
TOPICAL | Status: DC | PRN
  Administered 2024-08-14: 1000 mL

## 2024-08-14 MED ORDER — ROPIVACAINE HCL 5 MG/ML IJ SOLN
INTRAMUSCULAR | Status: DC | PRN
Start: 2024-08-14 — End: 2024-08-14
  Administered 2024-08-14: 20 mL via PERINEURAL

## 2024-08-14 MED ORDER — FENTANYL CITRATE (PF) 100 MCG/2ML IJ SOLN
INTRAMUSCULAR | Status: AC
  Filled 2024-08-14: qty 2

## 2024-08-14 MED ORDER — ACETAMINOPHEN 500 MG PO TABS
1000.0000 mg | ORAL_TABLET | Freq: Once | ORAL | Status: AC
  Administered 2024-08-14: 1000 mg via ORAL

## 2024-08-14 MED ORDER — MIDAZOLAM HCL 2 MG/2ML IJ SOLN
INTRAMUSCULAR | Status: AC
  Filled 2024-08-14: qty 2

## 2024-08-14 MED ORDER — ACETAMINOPHEN 500 MG PO TABS: 1000.0000 mg | ORAL_TABLET | Freq: Four times a day (QID) | ORAL | 0 refills | Status: AC | PRN

## 2024-08-14 MED ORDER — OXYCODONE HCL 5 MG PO TABS: 5.0000 mg | ORAL_TABLET | Freq: Four times a day (QID) | ORAL | 0 refills | Status: AC | PRN

## 2024-08-14 MED ORDER — GLYCOPYRROLATE PF 0.2 MG/ML IJ SOSY
PREFILLED_SYRINGE | INTRAMUSCULAR | Status: AC
  Filled 2024-08-14: qty 1

## 2024-08-14 MED ORDER — ONDANSETRON HCL 4 MG/2ML IJ SOLN
INTRAMUSCULAR | Status: DC | PRN
Start: 2024-08-14 — End: 2024-08-14
  Administered 2024-08-14: 4 mg via INTRAVENOUS

## 2024-08-14 MED ORDER — GLYCOPYRROLATE 0.2 MG/ML IJ SOLN
INTRAMUSCULAR | Status: DC | PRN
  Administered 2024-08-14: .1 mg via INTRAVENOUS

## 2024-08-14 MED ORDER — FENTANYL CITRATE (PF) 100 MCG/2ML IJ SOLN
100.0000 ug | Freq: Once | INTRAMUSCULAR | Status: AC
  Administered 2024-08-14: 50 ug via INTRAVENOUS

## 2024-08-14 MED ORDER — ACETAMINOPHEN 500 MG PO TABS
ORAL_TABLET | ORAL | Status: AC
  Filled 2024-08-14: qty 2

## 2024-08-14 MED ORDER — METOPROLOL TARTRATE 5 MG/5ML IV SOLN
INTRAVENOUS | Status: DC | PRN
  Administered 2024-08-14: 1 mg via INTRAVENOUS

## 2024-08-14 MED ORDER — CEFAZOLIN SODIUM-DEXTROSE 2-4 GM/100ML-% IV SOLN
INTRAVENOUS | Status: AC
  Filled 2024-08-14: qty 100

## 2024-08-14 MED ORDER — PROPOFOL 500 MG/50ML IV EMUL
INTRAVENOUS | Status: AC
  Filled 2024-08-14: qty 50

## 2024-08-14 MED ORDER — PROPOFOL 10 MG/ML IV BOLUS
INTRAVENOUS | Status: AC
  Filled 2024-08-14: qty 20

## 2024-08-14 MED ORDER — MIDAZOLAM HCL 2 MG/2ML IJ SOLN
2.0000 mg | Freq: Once | INTRAMUSCULAR | Status: AC
  Administered 2024-08-14: 1 mg via INTRAVENOUS

## 2024-08-14 MED ORDER — MIDAZOLAM HCL 2 MG/2ML IJ SOLN
INTRAMUSCULAR | Status: DC | PRN
  Administered 2024-08-14 (×2): 1 mg via INTRAVENOUS

## 2024-08-14 MED ORDER — LACTATED RINGERS IV SOLN: INTRAVENOUS | Status: DC

## 2024-08-14 MED ORDER — PROPOFOL 500 MG/50ML IV EMUL
INTRAVENOUS | Status: DC | PRN
  Administered 2024-08-14: 50 ug/kg/min via INTRAVENOUS

## 2024-08-14 MED ORDER — ONDANSETRON 4 MG PO TBDP: 4.0000 mg | ORAL_TABLET | Freq: Three times a day (TID) | ORAL | 0 refills | Status: AC | PRN

## 2024-08-14 MED ORDER — POVIDONE-IODINE 10 % EX SWAB
2.0000 | Freq: Once | CUTANEOUS | Status: AC
  Administered 2024-08-14: 2 via TOPICAL

## 2024-08-14 MED ORDER — FENTANYL CITRATE (PF) 100 MCG/2ML IJ SOLN
INTRAMUSCULAR | Status: DC | PRN
  Administered 2024-08-14 (×4): 25 ug via INTRAVENOUS

## 2024-08-14 MED ORDER — BACLOFEN 10 MG PO TABS: 10.0000 mg | ORAL_TABLET | Freq: Three times a day (TID) | ORAL | 0 refills | Status: DC | PRN

## 2024-08-14 MED ORDER — ASPIRIN 81 MG PO TBEC: 81.0000 mg | DELAYED_RELEASE_TABLET | Freq: Two times a day (BID) | ORAL | 0 refills | Status: AC

## 2024-08-14 SURGICAL SUPPLY — 51 items
BENZOIN TINCTURE PRP APPL 2/3 (GAUZE/BANDAGES/DRESSINGS) IMPLANT
BLADE SURG 15 STRL LF DISP TIS (BLADE) ×1 IMPLANT
BNDG COMPR ESMARK 4X3 LF (GAUZE/BANDAGES/DRESSINGS) IMPLANT
BNDG COMPR ESMARK 6X3 LF (GAUZE/BANDAGES/DRESSINGS) IMPLANT
BNDG ELASTIC 4INX 5YD STR LF (GAUZE/BANDAGES/DRESSINGS) ×2 IMPLANT
BNDG STRETCH GAUZE 3IN X12FT (GAUZE/BANDAGES/DRESSINGS) IMPLANT
CHLORAPREP W/TINT 26 (MISCELLANEOUS) ×1 IMPLANT
CLSR STERI-STRIP ANTIMIC 1/2X4 (GAUZE/BANDAGES/DRESSINGS) IMPLANT
CUFF TOURN SGL QUICK 18X4 (TOURNIQUET CUFF) IMPLANT
DRAPE EXTREMITY T 121X128X90 (DISPOSABLE) ×1 IMPLANT
DRAPE OEC MINIVIEW 54X84 (DRAPES) IMPLANT
DRAPE U-SHAPE 47X51 STRL (DRAPES) IMPLANT
ELECTRODE REM PT RTRN 9FT ADLT (ELECTROSURGICAL) ×1 IMPLANT
GAUZE PAD ABD 8X10 STRL (GAUZE/BANDAGES/DRESSINGS) IMPLANT
GAUZE SPONGE 4X4 12PLY STRL (GAUZE/BANDAGES/DRESSINGS) ×1 IMPLANT
GAUZE STRETCH 2X75IN STRL (MISCELLANEOUS) IMPLANT
GLOVE BIO SURGEON STRL SZ7.5 (GLOVE) ×1 IMPLANT
GLOVE BIOGEL PI IND STRL 7.0 (GLOVE) ×1 IMPLANT
GLOVE BIOGEL PI IND STRL 8 (GLOVE) ×1 IMPLANT
GLOVE SURG SYN 7.0 PF PI (GLOVE) ×1 IMPLANT
GOWN STRL REUS W/ TWL LRG LVL3 (GOWN DISPOSABLE) ×2 IMPLANT
GOWN STRL REUS W/ TWL XL LVL3 (GOWN DISPOSABLE) ×1 IMPLANT
GOWN STRL REUS W/TWL XL LVL3 (GOWN DISPOSABLE) ×1 IMPLANT
IMPL SYS 2ND FX PEEK 4.75X19.1 (Miscellaneous) IMPLANT
LOOP 2 FIBERLINK CLOSED (SUTURE) IMPLANT
NDL MAYO TROCAR (NEEDLE) IMPLANT
NDL SUT 6 .5 CRC .975X.05 MAYO (NEEDLE) ×1 IMPLANT
NEEDLE MAYO TROCAR (NEEDLE) IMPLANT
PACK ARTHROSCOPY DSU (CUSTOM PROCEDURE TRAY) ×1 IMPLANT
PACK BASIN DAY SURGERY FS (CUSTOM PROCEDURE TRAY) ×1 IMPLANT
PAD CAST 3X4 CTTN HI CHSV (CAST SUPPLIES) ×1 IMPLANT
PAD CAST 4YDX4 CTTN HI CHSV (CAST SUPPLIES) ×1 IMPLANT
PADDING CAST ABS COTTON 4X4 ST (CAST SUPPLIES) ×1 IMPLANT
PADDING CAST SYNTHETIC 4X4 STR (CAST SUPPLIES) IMPLANT
PENCIL SMOKE EVACUATOR (MISCELLANEOUS) ×1 IMPLANT
RETRIEVER SUT HEWSON (MISCELLANEOUS) IMPLANT
SLEEVE SCD COMPRESS KNEE MED (STOCKING) IMPLANT
SLING ARM FOAM STRAP LRG (SOFTGOODS) IMPLANT
SPIKE FLUID TRANSFER (MISCELLANEOUS) IMPLANT
SPLINT PLASTER CAST XFAST 3X15 (CAST SUPPLIES) IMPLANT
SPONGE T-LAP 4X18 ~~LOC~~+RFID (SPONGE) ×1 IMPLANT
STOCKINETTE 4X48 STRL (DRAPES) IMPLANT
SUT MNCRL AB 4-0 PS2 18 (SUTURE) ×1 IMPLANT
SUT VIC AB 2-0 SH 27XBRD (SUTURE) ×1 IMPLANT
SUT VICRYL 0 SH 27 (SUTURE) ×1 IMPLANT
SUTURE FIBERWR #2 38 T-5 BLUE (SUTURE) IMPLANT
SUTURE FIBERWR 2-0 18 17.9 3/8 (SUTURE) IMPLANT
SYR BULB EAR ULCER 3OZ GRN STR (SYRINGE) ×1 IMPLANT
TOWEL GREEN STERILE FF (TOWEL DISPOSABLE) ×1 IMPLANT
UNDERPAD 30X36 HEAVY ABSORB (UNDERPADS AND DIAPERS) ×1 IMPLANT
YANKAUER SUCT BULB TIP NO VENT (SUCTIONS) ×1 IMPLANT

## 2024-08-14 NOTE — Progress Notes (Signed)
Assisted Dr. Renold Don with left, supraclavicular, ultrasound guided block. Side rails up, monitors on throughout procedure. See vital signs in flow sheet. Tolerated Procedure well.

## 2024-08-14 NOTE — Discharge Instructions (Addendum)
 POST-OPERATIVE OPIOID TAPER INSTRUCTIONS: It is important to wean off of your opioid medication as soon as possible. If you do not need pain medication after your surgery it is ok to stop day one. Opioids include: Codeine, Hydrocodone (Norco, Vicodin), Oxycodone (Percocet, oxycontin ) and hydromorphone  amongst others.  Long term and even short term use of opiods can cause: Increased pain response Dependence Constipation Depression Respiratory depression And more.  Withdrawal symptoms can include Flu like symptoms Nausea, vomiting And more Techniques to manage these symptoms Hydrate well Eat regular healthy meals Stay active Use relaxation techniques(deep breathing, meditating, yoga) Do Not substitute Alcohol to help with tapering If you have been on opioids for less than two weeks and do not have pain than it is ok to stop all together.  Plan to wean off of opioids This plan should start within one week post op of your joint replacement. Maintain the same interval or time between taking each dose and first decrease the dose.  Cut the total daily intake of opioids by one tablet each day Next start to increase the time between doses. The last dose that should be eliminated is the evening dose.     Post Anesthesia Home Care Instructions  Activity: Get plenty of rest for the remainder of the day. A responsible individual must stay with you for 24 hours following the procedure.  For the next 24 hours, DO NOT: -Drive a car -Advertising copywriter -Drink alcoholic beverages -Take any medication unless instructed by your physician -Make any legal decisions or sign important papers.  Meals: Start with liquid foods such as gelatin or soup. Progress to regular foods as tolerated. Avoid greasy, spicy, heavy foods. If nausea and/or vomiting occur, drink only clear liquids until the nausea and/or vomiting subsides. Call your physician if vomiting continues.  Special Instructions/Symptoms: Your  throat may feel dry or sore from the anesthesia or the breathing tube placed in your throat during surgery. If this causes discomfort, gargle with warm salt water. The discomfort should disappear within 24 hours.  If you had a scopolamine patch placed behind your ear for the management of post- operative nausea and/or vomiting:  1. The medication in the patch is effective for 72 hours, after which it should be removed.  Wrap patch in a tissue and discard in the trash. Wash hands thoroughly with soap and water. 2. You may remove the patch earlier than 72 hours if you experience unpleasant side effects which may include dry mouth, dizziness or visual disturbances. 3. Avoid touching the patch. Wash your hands with soap and water after contact with the patch.     Regional Anesthesia Blocks  1. You may not be able to move or feel the blocked extremity after a regional anesthetic block. This may last may last from 3-48 hours after placement, but it will go away. The length of time depends on the medication injected and your individual response to the medication. As the nerves start to wake up, you may experience tingling as the movement and feeling returns to your extremity. If the numbness and inability to move your extremity has not gone away after 48 hours, please call your surgeon.   2. The extremity that is blocked will need to be protected until the numbness is gone and the strength has returned. Because you cannot feel it, you will need to take extra care to avoid injury. Because it may be weak, you may have difficulty moving it or using it. You may not know what position  it is in without looking at it while the block is in effect.  3. For blocks in the legs and feet, returning to weight bearing and walking needs to be done carefully. You will need to wait until the numbness is entirely gone and the strength has returned. You should be able to move your leg and foot normally before you try and bear  weight or walk. You will need someone to be with you when you first try to ensure you do not fall and possibly risk injury.  4. Bruising and tenderness at the needle site are common side effects and will resolve in a few days.  5. Persistent numbness or new problems with movement should be communicated to the surgeon or the [facility] [phone] [facility] [phone]).  No tylenol  until after 430pm

## 2024-08-14 NOTE — Op Note (Signed)
 08/14/2024  12:37 PM  PATIENT:  [name]    PRE-OPERATIVE DIAGNOSIS:  LEFT ELBOW ULNAR NERVE LESION, TRICEPS TENDON RUPTURE, OLECRANON BURSITIS  POST-OPERATIVE DIAGNOSIS:  Same  PROCEDURE:  ULNAR NERVE DECOMPRESSION/TRANSPOSITION  SURGEON:  [name], MD  ASSISTANT: Gerard Large, PA-C, he was present and scrubbed throughout the case, critical for completion in a timely fashion, and for retraction, instrumentation, and closure.   ANESTHESIA:   gen plus block  PREOPERATIVE INDICATIONS:  [name] is a  63 y.o. female with a diagnosis of LEFT ELBOW ULNAR NERVE LESION, TRICEPS TENDON RUPTURE, OLECRANON BURSITIS who failed conservative measures and elected for surgical management.    The risks benefits and alternatives were discussed with the patient preoperatively including but not limited to the risks of infection, bleeding, nerve injury, cardiopulmonary complications, the need for revision surgery, among others, and the patient was willing to proceed.  OPERATIVE IMPLANTS: Arthrex anchor  OPERATIVE FINDINGS: Ruptured triceps tendon  BLOOD LOSS: Minimal  COMPLICATIONS: None  TOURNIQUET TIME: 60  OPERATIVE PROCEDURE:  Patient was identified in the preoperative holding area and site was marked by me She was transported to the operating theater and placed on the table in supine position taking care to pad all bony prominences. After a preincinduction time out anesthesia was induced. The left upper extremity was prepped and draped in normal sterile fashion and a pre-incision timeout was performed. She received Ancef for preoperative antibiotics.   She was placed in the lateral position padding all bony prominences and an ax roll was placed arm was placed over a padded sure foot prior to prepping and draping  I made a posterior incision over the olecranon and distal triceps identified her olecranon bursa which was resected using a rondure and pickups  Next  identified her cubital tunnel and mobilized the ulnar nerve to avoid any entrapment or injury from the surgery.  Identified the ruptured and slight lightly retracted triceps tendon I was able to place whip stitches in this I then secured it to the olecranon using a dual row drill tunnel technique with a single swivel lock anchor  Thorough irrigation was performed followed by complex closure  POST OPERATIVE PLAN: [employer] full-time

## 2024-08-14 NOTE — Interval H&P Note (Signed)
 History and Physical Interval Note:  08/14/2024 12:57 PM  [name]  has presented today for surgery, with the diagnosis of LEFT ELBOW ULNAR NERVE LESION, TRICEPS TENDON RUPTURE, OLECRANON BURSITIS.  The various methods of treatment have been discussed with the patient and family. After consideration of risks, benefits and other options for treatment, the patient has consented to  Procedure(s) with comments: ULNAR NERVE DECOMPRESSION/TRANSPOSITION (Left) - with triceps tendon repair and olecranon bursectomy as a surgical intervention.  The patient's history has been reviewed, patient examined, no change in status, stable for surgery.  I have reviewed the patient's chart and labs.  Questions were answered to the patient's satisfaction.     [name]

## 2024-08-14 NOTE — Progress Notes (Signed)
 Anesthesiologist review: This patient has very little information in her epic chart. Last recorded EF is 19%, but in 2013. There is no evidence that she has been follow/managed by cardiology. This morning I was given a clearance fax cover sheet from cardiologist Harwaini that states she is low risk and cleared for surgery, and an echo from 2019 that shows here EF had recovered to 50-55%. However, based on ACC/AHA guidelines, this is inadequate. At minimum, I should have her LOV note from [facility] demonstrating how her NICM has been managed. I would also like to discuss the recommendations for surveillance echocardiograms in patients with a history of severe NICM. Was this a single normalized EF? Did she have serial echocardiograms over years where she was stable at 50-55%? What has been her consistency of F/U with Harwaini? There are too many unknowns in my opinion to say this patient is safe for surgery, much less at an ASC.

## 2024-08-14 NOTE — Transfer of Care (Signed)
 Immediate Anesthesia Transfer of Care Note  Patient: [name]  Procedure(s) Performed: ULNAR NERVE DECOMPRESSION WITH TRICEPS TENDON REPAIR AND OLECRANON BURSECTOMY (Left: Elbow)  Patient Location: PACU  Anesthesia Type:MAC and Regional  Level of Consciousness: alert   Airway & Oxygen  Therapy: Patient Spontanous Breathing and Patient connected to face mask oxygen   Post-op Assessment: Report given to RN, Post -op Vital signs reviewed and stable, and Patient able to stick tongue midline  Post vital signs: Reviewed and stable  Last Vitals:  Vitals Value Taken Time  BP 138/99 08/14/24 14:54  Temp 36.3 C 08/14/24 14:54  Pulse 88 08/14/24 14:55  Resp 17 08/14/24 14:55  SpO2 93 % 08/14/24 14:55  Vitals shown include unfiled device data.  Last Pain:  Vitals:   08/14/24 1022  TempSrc: Temporal  PainSc: 0-No pain      Patients Stated Pain Goal: 4 (08/14/24 1022)  Complications: No notable events documented.

## 2024-08-14 NOTE — Anesthesia Postprocedure Evaluation (Signed)
 Anesthesia Post Note  Patient: [name]  Procedure(s) Performed: ULNAR NERVE DECOMPRESSION WITH TRICEPS TENDON REPAIR AND OLECRANON BURSECTOMY (Left: Elbow)     Patient location during evaluation: PACU Anesthesia Type: Regional Level of consciousness: awake and alert Pain management: pain level controlled Vital Signs Assessment: post-procedure vital signs reviewed and stable Respiratory status: spontaneous breathing Cardiovascular status: stable Anesthetic complications: no   No notable events documented.  Last Vitals:  Vitals:   08/14/24 1500 08/14/24 1528  BP: (!) 133/99 (!) 133/99  Pulse: 87 65  Resp: 17 16  Temp:  (!) 36.2 C  SpO2: 96% 96%    Last Pain:  Vitals:   08/14/24 1528  TempSrc:   PainSc: 0-No pain                 Norleen Pope

## 2024-08-14 NOTE — Anesthesia Procedure Notes (Signed)
 Anesthesia Regional Block: Supraclavicular block   Pre-Anesthetic Checklist: , timeout performed,  Correct Patient, Correct Site, Correct Laterality,  Correct Procedure, Correct Position, site marked,  Risks and benefits discussed,  Surgical consent,  Pre-op evaluation,  At surgeon's request and post-op pain management  Laterality: Upper and Left  Prep: chloraprep       Needles:  Injection technique: Single-shot  Needle Type: Stimiplex          Additional Needles:   Procedures:,,,, ultrasound used (permanent image in chart),,    Narrative:  Start time: 08/14/2024 11:20 AM End time: 08/14/2024 11:40 AM Injection made incrementally with aspirations every 5 mL.  Performed by: Personally  Anesthesiologist: Darlyn Rush, MD  Additional Notes: BP cuff, SpO2 and EKG monitors applied. Sedation begun. Nerve location verified with ultrasound. Anesthetic injected incrementally, slowly, and after neg aspirations under direct u/s guidance. Good perineural spread. Tolerated well.

## 2024-08-16 ENCOUNTER — Encounter (HOSPITAL_BASED_OUTPATIENT_CLINIC_OR_DEPARTMENT_OTHER): Payer: Self-pay | Admitting: Orthopedic Surgery

## 2024-08-17 DIAGNOSIS — M25522 Pain in left elbow: Secondary | ICD-10-CM | POA: Diagnosis not present

## 2024-08-28 ENCOUNTER — Encounter: Payer: Self-pay | Admitting: *Deleted

## 2024-08-28 NOTE — Progress Notes (Signed)
 CHRISHANA SPARGUR                                          MRN: [id number]   08/28/2024   The [facility] Quality Team Specialist reviewed this patient medical record for the purposes of chart review for care gap closure. The following were reviewed: chart review for care gap closure-glycemic status assessment and kidney health evaluation for diabetes:eGFR  and uACR.    [facility] Quality Team

## 2024-10-04 ENCOUNTER — Other Ambulatory Visit: Payer: Self-pay

## 2024-10-04 ENCOUNTER — Emergency Department (HOSPITAL_COMMUNITY)

## 2024-10-04 ENCOUNTER — Emergency Department (HOSPITAL_COMMUNITY): Admission: EM | Admit: 2024-10-04 | Discharge: 2024-10-04 | Disposition: A | Discharge status: Home / Self Care

## 2024-10-04 ENCOUNTER — Encounter (HOSPITAL_COMMUNITY): Payer: Self-pay

## 2024-10-04 DIAGNOSIS — Y92410 Unspecified street and highway as the place of occurrence of the external cause: Secondary | ICD-10-CM | POA: Diagnosis not present

## 2024-10-04 DIAGNOSIS — S29001A Unspecified injury of muscle and tendon of front wall of thorax, initial encounter: Secondary | ICD-10-CM | POA: Diagnosis present

## 2024-10-04 DIAGNOSIS — S20219A Contusion of unspecified front wall of thorax, initial encounter: Secondary | ICD-10-CM | POA: Insufficient documentation

## 2024-10-04 DIAGNOSIS — Z7982 Long term (current) use of aspirin: Secondary | ICD-10-CM | POA: Diagnosis not present

## 2024-10-04 MED ORDER — ACETAMINOPHEN 500 MG PO TABS
1000.0000 mg | ORAL_TABLET | Freq: Once | ORAL | Status: AC
  Administered 2024-10-04: 1000 mg via ORAL
  Filled 2024-10-04: qty 2

## 2024-10-04 MED ORDER — LIDOCAINE 5 % EX PTCH: 1.0000 | MEDICATED_PATCH | CUTANEOUS | 0 refills | Status: AC
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
## 2024-10-04 NOTE — ED Notes (Signed)
 Pt sitting with brother in neighboring room.
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
## 2024-10-04 NOTE — Discharge Instructions (Signed)
 You may take over-the-counter medication such as Tylenol  or Profen for pain.  Lidocaine  patches as needed.  Return for fevers, chills, severe headache, vision loss, facial droop, difficulty breathing, severe pain, uncontrolled nausea vomiting, stop having bowel movements or any new or worsening symptoms that are concerning to you.

## 2024-10-05 ENCOUNTER — Telehealth (HOSPITAL_COMMUNITY): Payer: Self-pay | Admitting: Emergency Medicine

## 2024-10-05 MED ORDER — BACLOFEN 10 MG PO TABS: 10.0000 mg | ORAL_TABLET | Freq: Three times a day (TID) | ORAL | 0 refills | Status: AC | PRN

## 2024-10-05 NOTE — Telephone Encounter (Cosign Needed)
 Patient was seen in the ER yesterday.  Reports that she has been discharged home on muscle laxer's.  Do not see any mention of medications other than mention of lidocaine  patches in the discharge instructions.  I do see she was sent home previously on baclofen  for a visit in October.  Will send her home with a few of these to take as needed.

## 2024-11-07 ENCOUNTER — Other Ambulatory Visit (HOSPITAL_BASED_OUTPATIENT_CLINIC_OR_DEPARTMENT_OTHER): Payer: Self-pay

## 2024-11-07 MED ORDER — COMIRNATY 30 MCG/0.3ML IM SUSY
0.3000 mL | PREFILLED_SYRINGE | Freq: Once | INTRAMUSCULAR | 0 refills | Status: AC
  Filled 2024-11-07: qty 0.3, 1d supply, fill #0

## 2024-11-07 MED ORDER — FLUZONE HIGH-DOSE 0.5 ML IM SUSY
0.5000 mL | PREFILLED_SYRINGE | Freq: Once | INTRAMUSCULAR | 0 refills | Status: AC
  Filled 2024-11-07: qty 0.5, 1d supply, fill #0

## 2024-11-07 NOTE — Therapy (Signed)
 " OUTPATIENT PHYSICAL THERAPY SHOULDER EVALUATION   Patient Name: [name] MRN: [id number] DOB:1961-06-28, 64 y.o., female Today's Date: 11/08/2024  END OF SESSION:  PT End of Session - 11/08/24 1402     Visit Number 1    Number of Visits 12    Date for Recertification  01/06/25    Authorization Type U[employer] MCR    Activity Tolerance Patient tolerated treatment well    Behavior During Therapy [facility] for tasks assessed/performed          Past Medical History:  Diagnosis Date   Arthritis    Bowel obstruction (HCC)    Bronchitis    CHF (congestive heart failure) (HCC)    Chronic systolic dysfunction of left ventricle    Diabetes mellitus without complication (HCC)    on oral meds   GERD (gastroesophageal reflux disease)    Headache(784.0)    migraines, hasn't had one for over a year   History of noncompliance with medical treatment    Hypercholesteremia    Hypertension    LVH (left ventricular hypertrophy)    Nonischemic cardiomyopathy (HCC)    Polysubstance abuse (HCC)    Tobacco abuse disorder    Past Surgical History:  Procedure Laterality Date   ABDOMINAL HYSTERECTOMY     ABDOMINAL SURGERY     left foot surgery     STRABISMUS SURGERY Left 07/31/2013   Procedure: REPAIR STRABISMUS;  Surgeon: Elsie MALVA Salt, MD;  Location: [facility] OR;  Service: Ophthalmology;  Laterality: Left;  EYE MUSCLE SURGERY LEFT EYE   ULNAR NERVE TRANSPOSITION Left 08/14/2024   Procedure: ULNAR NERVE DECOMPRESSION WITH TRICEPS TENDON REPAIR AND OLECRANON BURSECTOMY;  Surgeon: Beverley Evalene JONETTA, MD;  Location: [facility];  Service: Orthopedics;  Laterality: Left;   UTERINE FIBROID SURGERY     Patient Active Problem List   Diagnosis Date Noted   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 08/14/2024   CKD (chronic kidney disease) 06/28/2018   Chronic systolic CHF (congestive heart failure) (HCC) 06/28/2018   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 06/28/2018    Nephropathy associated with another disease 06/28/2018   Lumbago 06/28/2018   Vitamin D  deficiency, unspecified 06/28/2018   Routine general medical examination at a health care facility 06/28/2018   Dry eye syndrome 06/28/2018   Imbalance 10/04/2017   Diabetes (HCC) 08/09/2016   Left acoustic neuroma (HCC) 10/03/2015   Essential hypertension 03/29/2012   Chronic systolic dysfunction of left ventricle 11/24/2011   ACS (acute coronary syndrome) (HCC) 11/23/2011   CHF, acute (HCC) 11/23/2011   Cocaine abuse (HCC) 11/23/2011   Tobacco abuse 11/23/2011   Pneumonitis, aspiration (HCC) 11/23/2011   Cardiomyopathy (HCC) 11/23/2011   HTN (hypertension), malignant 11/23/2011    PCP: Benjamine Aland, MD   REFERRING PROVIDER: Beverley Evalene JONETTA, MD  REFERRING DIAG: [phone] (ICD-10-CM) - Pain in right elbow(should be L elbow)  THERAPY DIAG:  Pain in left elbow  Muscle weakness (generalized)  Strain of left triceps, initial encounter  Rationale for Evaluation and Treatment: Rehabilitation  ONSET DATE: 08/14/24 DOS    SUBJECTIVE:  SUBJECTIVE STATEMENT: [name] and tore L triceps tendon distally, underwent tendon repair on 08/14/24.  Overall pain minimal, main concern is stiffness and weakness.  Denies N&T or paresthesias. Hand dominance: Right  PERTINENT HISTORY: Diagnostic Studies:  No new diagnostic studies were performed during this visit. Previous triceps tendon repair was approximately eight weeks ago on 10/14.   Impression:  64 year old female with left elbow and shoulder pain post-triceps tendon repair.   Plan:  - Recommend physical therapy for the left elbow and shoulder to improve range of motion and strength.  - Follow-up appointment in six to eight weeks to assess progress.  - Discussed that  the bruise from the brace should resolve over time.  - Patient advised to continue using the knee sleeve as needed for support.  - Encouraged to engage in therapy to expedite recovery and improve function.  PAIN:  Are you having pain? Yes: NPRS scale: 0-5/10 Pain location: L elbow, posterior Pain description: ache Aggravating factors: activity an sleep positions Relieving factors: rest  PRECAUTIONS: None  RED FLAGS: None   WEIGHT BEARING RESTRICTIONS: No  FALLS:  Has patient fallen in last 6 months? No  OCCUPATION: House cleaner  PLOF: Independent  PATIENT GOALS:To manage my elbow issues  NEXT MD VISIT:   OBJECTIVE:  Note: Objective measures were completed at Evaluation unless otherwise noted.  DIAGNOSTIC FINDINGS:  None recent  PATIENT SURVEYS:  Patient-specific activity scoring scheme (Point to one number):  0 represents unable to perform. 10 represents able to perform at prior level. 0 1 2 3 4 5 6 7 8 9  10 (Date and Score) Activity Initial  Activity Eval     Sleeping through the night  5    Straightening my elbow  5    Lifting 5 # 5     Total score = sum of the activity scores/number of activities Minimum detectable change (90%CI) for average score = 2 points Minimum detectable change (90%CI) for single activity score = 3 points PSFS developed by: [name]., & Binkley, J. (1995). Assessing disability and change on individual  patients: a report of a patient specific measure. [employer] [place], 47, 741-736. Reproduced with the permission of the authors  Score: 15/30 50%    POSTURE: WFL  UPPER EXTREMITY ROM:   A/PROM Right eval Left eval  Shoulder flexion    Shoulder extension    Shoulder abduction    Shoulder adduction    Shoulder internal rotation    Shoulder external rotation    Elbow flexion  WNL  Elbow extension  -5d/WNL  Wrist flexion  WNL  Wrist extension  WNL  Wrist ulnar deviation  WNL   Wrist radial deviation  WNL  Wrist pronation  WNL  Wrist supination  WNL  (Blank rows = not tested)  UPPER EXTREMITY MMT:  MMT Right eval Left eval  Shoulder flexion    Shoulder extension    Shoulder abduction    Shoulder adduction    Shoulder internal rotation    Shoulder external rotation    Middle trapezius    Lower trapezius    Elbow flexion  4-  Elbow extension  3  Wrist flexion  4  Wrist extension  4  Wrist ulnar deviation    Wrist radial deviation    Wrist pronation  4  Wrist supination  4  Grip strength (lbs) 42# 26#  Pinch key 9# 8#  (Blank rows = not tested)  SHOULDER SPECIAL TESTS: deferred  JOINT  MOBILITY TESTING:  deferred  PALPATION:  unremarkable                                                                                                                             TREATMENT: [facility] Adult PT Treatment:                                                DATE: 11/08/24 Eval and HEP Self Care: Additional minutes spent for educating on updated Therapeutic Home Exercise Program as well as comparing current status to condition at start of symptoms. This included exercises focusing on stretching, strengthening, with focus on eccentric aspects. Long term goals include an improvement in range of motion, strength, endurance as well as avoiding reinjury. Patient's frequency would include in 1-2 times a day, 3-5 times a week for a duration of 6-12 weeks. Proper technique shown and discussed handout in great detail. All questions were discussed and addressed.     PATIENT EDUCATION: Education details: Discussed eval findings, rehab rationale and POC and patient is in agreement  Person educated: Patient Education method: Explanation and Handouts Education comprehension: verbalized understanding and needs further education  HOME EXERCISE PROGRAM: Access Code: [email or web address] URL: [email or web address] Date: 11/08/2024 Prepared by: Reyes Kohut  Exercises - Forearm Pronation with Dumbbell  - 3 x daily - 5 x weekly - 1 sets - 15 reps - Seated Elbow Flexion with Resistance  - 3 x daily - 5 x weekly - 1 sets - 15 reps - Standing Elbow Extension with Self-Anchored Resistance  - 3 x daily - 5 x weekly - 1 sets - 15 reps  ASSESSMENT:  CLINICAL IMPRESSION: Patient is a 64 y.o. femal who was seen today for physical therapy evaluation and treatment for L elbow weakness, loss of mobility and discomfort following triceps repair on 08/14/24.  Patient reports little to no resting pain.  PROM is WNL, AROM limited to -5d due to mild soft tissue edema.  Key grip strength is good but L grip strength diminished.  L wrist ROM/mobility is WNL.  Patient is a good rehab canadidate to focus on regaining L elbow strength and function.  OBJECTIVE IMPAIRMENTS: decreased activity tolerance, decreased knowledge of condition, decreased mobility, decreased ROM, decreased strength, impaired UE functional use, and pain.   ACTIVITY LIMITATIONS: carrying, lifting, sleeping, dressing, self feeding, and reach over head  PARTICIPATION LIMITATIONS: meal prep and cleaning  PERSONAL FACTORS: Age and Time since onset of injury/illness/exacerbation are also affecting patient's functional outcome.   REHAB POTENTIAL: Good  CLINICAL DECISION MAKING: Stable/uncomplicated  EVALUATION COMPLEXITY: Moderate   GOALS: Goals reviewed with patient? No  SHORT TERM GOALS: Target date: 12/06/2024    Patient to demonstrate independence in HEP  Baseline: [email or web address] Goal status: INITIAL  2.  0d AROM L elbow extension Baseline: -5d Goal status:  INITIAL  LONG TERM GOALS: Target date: 01/03/2025    Patient will acknowledge 2/10 pain at least once during episode of care   Baseline: 5/10 Goal status: INITIAL  2.  Patient will score at least 20/30 on PSFS to signify clinically meaningful improvement in functional abilities.   Baseline: 15/30 Goal status: INITIAL  3.   Increase L elbow/wrist strength to 4+/5 Baseline:   R L  Elbow flexion  4-  Elbow extension  3  Wrist flexion  4  Wrist extension  4  Wrist ulnar deviation    Wrist radial deviation    Wrist pronation  4  Wrist supination  4   Goal status: INITIAL    PLAN:  PT FREQUENCY: 2x/week  PT DURATION: 6 weeks  PLANNED INTERVENTIONS: 97110-Therapeutic exercises, 97530- Therapeutic activity, 97112- Neuromuscular re-education, 97535- Self Care, [id number]- Manual therapy, and Patient/Family education  PLAN FOR NEXT SESSION: HEP review and update, manual techniques as appropriate, aerobic tasks, ROM and flexibility activities, strengthening and PREs, TPDN, gait and balance training,aquatic therapy, modalities for pain and NMRE     Date of referral: 10/15/2024 Referring provider: Beverley Evalene BIRCH, MD Referring diagnosis? L triceps repair Treatment diagnosis? (if different than referring diagnosis) L triceps repair  What was this (referring dx) caused by? Felton Hawks of Condition: Initial Onset (within last 3 months)   Laterality: Lt  Current Functional Measure Score: Patient Specific Functional Scale 15/30  Objective measurements identify impairments when they are compared to normal values, the uninvolved extremity, and prior level of function.  [x]  Yes  []  No  Objective assessment of functional ability: Minimal functional limitations   Briefly describe symptoms: stiffness and weakness in L elbow  How did symptoms start: fall  Average pain intensity:  Last 24 hours: 5  Past week: 0-5  How often does the pt experience symptoms? Occasionally  How much have the symptoms interfered with usual daily activities? Moderately  How has condition changed since care began at this facility? NA - initial visit  In general, how is the patients overall health? Good   BACK PAIN (STarT Back Screening Tool) No   Reyes CHRISTELLA Kohut, PT 11/08/2024, 2:03 PM  "

## 2024-11-08 ENCOUNTER — Other Ambulatory Visit: Payer: Self-pay

## 2024-11-08 ENCOUNTER — Ambulatory Visit: Attending: Orthopedic Surgery

## 2024-11-08 DIAGNOSIS — M25522 Pain in left elbow: Secondary | ICD-10-CM | POA: Diagnosis present

## 2024-11-08 DIAGNOSIS — S46312A Strain of muscle, fascia and tendon of triceps, left arm, initial encounter: Secondary | ICD-10-CM | POA: Insufficient documentation

## 2024-11-08 DIAGNOSIS — M6281 Muscle weakness (generalized): Secondary | ICD-10-CM | POA: Insufficient documentation

## 2024-11-19 NOTE — Therapy (Unsigned)
 " OUTPATIENT PHYSICAL THERAPY SHOULDER EVALUATION   Patient Name: [name] MRN: [id number] DOB:[birth year], 64 y.o., female Today's Date: 11/19/2024  END OF SESSION:    Past Medical History:  Diagnosis Date   Arthritis    Bowel obstruction (HCC)    Bronchitis    CHF (congestive heart failure) (HCC)    Chronic systolic dysfunction of left ventricle    Diabetes mellitus without complication (HCC)    on oral meds   GERD (gastroesophageal reflux disease)    Headache(784.0)    migraines, hasn't had one for over a year   History of noncompliance with medical treatment    Hypercholesteremia    Hypertension    LVH (left ventricular hypertrophy)    Nonischemic cardiomyopathy (HCC)    Polysubstance abuse (HCC)    Tobacco abuse disorder    Past Surgical History:  Procedure Laterality Date   ABDOMINAL HYSTERECTOMY     ABDOMINAL SURGERY     left foot surgery     STRABISMUS SURGERY Left 07/31/2013   Procedure: REPAIR STRABISMUS;  Surgeon: Elsie MALVA Salt, MD;  Location: [facility] OR;  Service: Ophthalmology;  Laterality: Left;  EYE MUSCLE SURGERY LEFT EYE   ULNAR NERVE TRANSPOSITION Left 08/14/2024   Procedure: ULNAR NERVE DECOMPRESSION WITH TRICEPS TENDON REPAIR AND OLECRANON BURSECTOMY;  Surgeon: Beverley Evalene JONETTA, MD;  Location: [facility];  Service: Orthopedics;  Laterality: Left;   UTERINE FIBROID SURGERY     Patient Active Problem List   Diagnosis Date Noted   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 08/14/2024   CKD (chronic kidney disease) 06/28/2018   Chronic systolic CHF (congestive heart failure) (HCC) 06/28/2018   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 06/28/2018   Nephropathy associated with another disease 06/28/2018   Lumbago 06/28/2018   Vitamin D  deficiency, unspecified 06/28/2018   Routine general medical examination at a health care facility 06/28/2018   Dry eye syndrome 06/28/2018   Imbalance 10/04/2017   Diabetes (HCC)  08/09/2016   Left acoustic neuroma (HCC) 10/03/2015   Essential hypertension 03/29/2012   Chronic systolic dysfunction of left ventricle 11/24/2011   ACS (acute coronary syndrome) (HCC) 11/23/2011   CHF, acute (HCC) 11/23/2011   Cocaine abuse (HCC) 11/23/2011   Tobacco abuse 11/23/2011   Pneumonitis, aspiration (HCC) 11/23/2011   Cardiomyopathy (HCC) 11/23/2011   HTN (hypertension), malignant 11/23/2011    PCP: Benjamine Aland, MD   REFERRING PROVIDER: Beverley Evalene JONETTA, MD  REFERRING DIAG: [phone] (ICD-10-CM) - Pain in right elbow(should be L elbow)  THERAPY DIAG:  No diagnosis found.  Rationale for Evaluation and Treatment: Rehabilitation  ONSET DATE: 08/14/24 DOS    SUBJECTIVE:  SUBJECTIVE STATEMENT: [name] and tore L triceps tendon distally, underwent tendon repair on 08/14/24.  Overall pain minimal, main concern is stiffness and weakness.  Denies N&T or paresthesias. Hand dominance: Right  PERTINENT HISTORY: Diagnostic Studies:  No new diagnostic studies were performed during this visit. Previous triceps tendon repair was approximately eight weeks ago on 10/14.   Impression:  64 year old female with left elbow and shoulder pain post-triceps tendon repair.   Plan:  - Recommend physical therapy for the left elbow and shoulder to improve range of motion and strength.  - Follow-up appointment in six to eight weeks to assess progress.  - Discussed that the bruise from the brace should resolve over time.  - Patient advised to continue using the knee sleeve as needed for support.  - Encouraged to engage in therapy to expedite recovery and improve function.  PAIN:  Are you having pain? Yes: NPRS scale: 0-5/10 Pain location: L elbow, posterior Pain description: ache Aggravating factors: activity an  sleep positions Relieving factors: rest  PRECAUTIONS: None  RED FLAGS: None   WEIGHT BEARING RESTRICTIONS: No  FALLS:  Has patient fallen in last 6 months? No  OCCUPATION: House cleaner  PLOF: Independent  PATIENT GOALS:To manage my elbow issues  NEXT MD VISIT:   OBJECTIVE:  Note: Objective measures were completed at Evaluation unless otherwise noted.  DIAGNOSTIC FINDINGS:  None recent  PATIENT SURVEYS:  Patient-specific activity scoring scheme (Point to one number):  0 represents unable to perform. 10 represents able to perform at prior level. 0 1 2 3 4 5 6 7 8 9  10 (Date and Score) Activity Initial  Activity Eval     Sleeping through the night  5    Straightening my elbow  5    Lifting 5 # 5     Total score = sum of the activity scores/number of activities Minimum detectable change (90%CI) for average score = 2 points Minimum detectable change (90%CI) for single activity score = 3 points PSFS developed by: [name]., & Binkley, J. (1995). Assessing disability and change on individual  patients: a report of a patient specific measure. [employer] [place], 47, 741-736. Reproduced with the permission of the authors  Score: 15/30 50%    POSTURE: WFL  UPPER EXTREMITY ROM:   A/PROM Right eval Left eval  Shoulder flexion    Shoulder extension    Shoulder abduction    Shoulder adduction    Shoulder internal rotation    Shoulder external rotation    Elbow flexion  WNL  Elbow extension  -5d/WNL  Wrist flexion  WNL  Wrist extension  WNL  Wrist ulnar deviation  WNL  Wrist radial deviation  WNL  Wrist pronation  WNL  Wrist supination  WNL  (Blank rows = not tested)  UPPER EXTREMITY MMT:  MMT Right eval Left eval  Shoulder flexion    Shoulder extension    Shoulder abduction    Shoulder adduction    Shoulder internal rotation    Shoulder external rotation    Middle trapezius    Lower trapezius    Elbow  flexion  4-  Elbow extension  3  Wrist flexion  4  Wrist extension  4  Wrist ulnar deviation    Wrist radial deviation    Wrist pronation  4  Wrist supination  4  Grip strength (lbs) 42# 26#  Pinch key 9# 8#  (Blank rows = not tested)  SHOULDER SPECIAL TESTS: deferred  JOINT  MOBILITY TESTING:  deferred  PALPATION:  unremarkable                                                                                                                             TREATMENT: [facility] Adult PT Treatment:                                                DATE: 11/08/24 Eval and HEP Self Care: Additional minutes spent for educating on updated Therapeutic Home Exercise Program as well as comparing current status to condition at start of symptoms. This included exercises focusing on stretching, strengthening, with focus on eccentric aspects. Long term goals include an improvement in range of motion, strength, endurance as well as avoiding reinjury. Patient's frequency would include in 1-2 times a day, 3-5 times a week for a duration of 6-12 weeks. Proper technique shown and discussed handout in great detail. All questions were discussed and addressed.     PATIENT EDUCATION: Education details: Discussed eval findings, rehab rationale and POC and patient is in agreement  Person educated: Patient Education method: Explanation and Handouts Education comprehension: verbalized understanding and needs further education  HOME EXERCISE PROGRAM: Access Code: [email or web address] URL: [email or web address] Date: 11/08/2024 Prepared by: Reyes Kohut  Exercises - Forearm Pronation with Dumbbell  - 3 x daily - 5 x weekly - 1 sets - 15 reps - Seated Elbow Flexion with Resistance  - 3 x daily - 5 x weekly - 1 sets - 15 reps - Standing Elbow Extension with Self-Anchored Resistance  - 3 x daily - 5 x weekly - 1 sets - 15 reps  ASSESSMENT:  CLINICAL IMPRESSION: Patient is a 64 y.o. femal who was seen today for  physical therapy evaluation and treatment for L elbow weakness, loss of mobility and discomfort following triceps repair on 08/14/24.  Patient reports little to no resting pain.  PROM is WNL, AROM limited to -5d due to mild soft tissue edema.  Key grip strength is good but L grip strength diminished.  L wrist ROM/mobility is WNL.  Patient is a good rehab canadidate to focus on regaining L elbow strength and function.  OBJECTIVE IMPAIRMENTS: decreased activity tolerance, decreased knowledge of condition, decreased mobility, decreased ROM, decreased strength, impaired UE functional use, and pain.   ACTIVITY LIMITATIONS: carrying, lifting, sleeping, dressing, self feeding, and reach over head  PARTICIPATION LIMITATIONS: meal prep and cleaning  PERSONAL FACTORS: Age and Time since onset of injury/illness/exacerbation are also affecting patient's functional outcome.   REHAB POTENTIAL: Good  CLINICAL DECISION MAKING: Stable/uncomplicated  EVALUATION COMPLEXITY: Moderate   GOALS: Goals reviewed with patient? No  SHORT TERM GOALS: Target date: 12/06/2024    Patient to demonstrate independence in HEP  Baseline: [email or web address] Goal status: INITIAL  2.  0d AROM L elbow extension Baseline: -5d Goal status:  INITIAL  LONG TERM GOALS: Target date: 01/03/2025    Patient will acknowledge 2/10 pain at least once during episode of care   Baseline: 5/10 Goal status: INITIAL  2.  Patient will score at least 20/30 on PSFS to signify clinically meaningful improvement in functional abilities.   Baseline: 15/30 Goal status: INITIAL  3.  Increase L elbow/wrist strength to 4+/5 Baseline:   R L  Elbow flexion  4-  Elbow extension  3  Wrist flexion  4  Wrist extension  4  Wrist ulnar deviation    Wrist radial deviation    Wrist pronation  4  Wrist supination  4   Goal status: INITIAL    PLAN:  PT FREQUENCY: 2x/week  PT DURATION: 6 weeks  PLANNED INTERVENTIONS: 97110-Therapeutic exercises,  97530- Therapeutic activity, 97112- Neuromuscular re-education, 97535- Self Care, [id number]- Manual therapy, and Patient/Family education  PLAN FOR NEXT SESSION: HEP review and update, manual techniques as appropriate, aerobic tasks, ROM and flexibility activities, strengthening and PREs, TPDN, gait and balance training,aquatic therapy, modalities for pain and NMRE     Date of referral: 10/15/2024 Referring provider: Beverley Evalene BIRCH, MD Referring diagnosis? L triceps repair Treatment diagnosis? (if different than referring diagnosis) L triceps repair  What was this (referring dx) caused by? Felton Hawks of Condition: Initial Onset (within last 3 months)   Laterality: Lt  Current Functional Measure Score: Patient Specific Functional Scale 15/30  Objective measurements identify impairments when they are compared to normal values, the uninvolved extremity, and prior level of function.  [x]  Yes  []  No  Objective assessment of functional ability: Minimal functional limitations   Briefly describe symptoms: stiffness and weakness in L elbow  How did symptoms start: fall  Average pain intensity:  Last 24 hours: 5  Past week: 0-5  How often does the pt experience symptoms? Occasionally  How much have the symptoms interfered with usual daily activities? Moderately  How has condition changed since care began at this facility? NA - initial visit  In general, how is the patients overall health? Good   BACK PAIN (STarT Back Screening Tool) No   Reyes CHRISTELLA Kohut, PT 11/19/2024, 10:38 AM  "

## 2024-11-21 ENCOUNTER — Ambulatory Visit

## 2024-11-21 NOTE — Therapy (Unsigned)
 " OUTPATIENT PHYSICAL THERAPY SHOULDER EVALUATION   Patient Name: [name] MRN: [id number] DOB:[birth year], 64 y.o., female Today's Date: 11/21/2024  END OF SESSION:    Past Medical History:  Diagnosis Date   Arthritis    Bowel obstruction (HCC)    Bronchitis    CHF (congestive heart failure) (HCC)    Chronic systolic dysfunction of left ventricle    Diabetes mellitus without complication (HCC)    on oral meds   GERD (gastroesophageal reflux disease)    Headache(784.0)    migraines, hasn't had one for over a year   History of noncompliance with medical treatment    Hypercholesteremia    Hypertension    LVH (left ventricular hypertrophy)    Nonischemic cardiomyopathy (HCC)    Polysubstance abuse (HCC)    Tobacco abuse disorder    Past Surgical History:  Procedure Laterality Date   ABDOMINAL HYSTERECTOMY     ABDOMINAL SURGERY     left foot surgery     STRABISMUS SURGERY Left 07/31/2013   Procedure: REPAIR STRABISMUS;  Surgeon: Elsie MALVA Salt, MD;  Location: [facility] OR;  Service: Ophthalmology;  Laterality: Left;  EYE MUSCLE SURGERY LEFT EYE   ULNAR NERVE TRANSPOSITION Left 08/14/2024   Procedure: ULNAR NERVE DECOMPRESSION WITH TRICEPS TENDON REPAIR AND OLECRANON BURSECTOMY;  Surgeon: Beverley Evalene JONETTA, MD;  Location: [facility];  Service: Orthopedics;  Laterality: Left;   UTERINE FIBROID SURGERY     Patient Active Problem List   Diagnosis Date Noted   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 08/14/2024   CKD (chronic kidney disease) 06/28/2018   Chronic systolic CHF (congestive heart failure) (HCC) 06/28/2018   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 06/28/2018   Nephropathy associated with another disease 06/28/2018   Lumbago 06/28/2018   Vitamin D  deficiency, unspecified 06/28/2018   Routine general medical examination at a health care facility 06/28/2018   Dry eye syndrome 06/28/2018   Imbalance 10/04/2017   Diabetes (HCC)  08/09/2016   Left acoustic neuroma (HCC) 10/03/2015   Essential hypertension 03/29/2012   Chronic systolic dysfunction of left ventricle 11/24/2011   ACS (acute coronary syndrome) (HCC) 11/23/2011   CHF, acute (HCC) 11/23/2011   Cocaine abuse (HCC) 11/23/2011   Tobacco abuse 11/23/2011   Pneumonitis, aspiration (HCC) 11/23/2011   Cardiomyopathy (HCC) 11/23/2011   HTN (hypertension), malignant 11/23/2011    PCP: Benjamine Aland, MD   REFERRING PROVIDER: Beverley Evalene JONETTA, MD  REFERRING DIAG: [phone] (ICD-10-CM) - Pain in right elbow(should be L elbow)  THERAPY DIAG:  No diagnosis found.  Rationale for Evaluation and Treatment: Rehabilitation  ONSET DATE: 08/14/24 DOS    SUBJECTIVE:  SUBJECTIVE STATEMENT: [name] and tore L triceps tendon distally, underwent tendon repair on 08/14/24.  Overall pain minimal, main concern is stiffness and weakness.  Denies N&T or paresthesias. Hand dominance: Right  PERTINENT HISTORY: Diagnostic Studies:  No new diagnostic studies were performed during this visit. Previous triceps tendon repair was approximately eight weeks ago on 10/14.   Impression:  64 year old female with left elbow and shoulder pain post-triceps tendon repair.   Plan:  - Recommend physical therapy for the left elbow and shoulder to improve range of motion and strength.  - Follow-up appointment in six to eight weeks to assess progress.  - Discussed that the bruise from the brace should resolve over time.  - Patient advised to continue using the knee sleeve as needed for support.  - Encouraged to engage in therapy to expedite recovery and improve function.  PAIN:  Are you having pain? Yes: NPRS scale: 0-5/10 Pain location: L elbow, posterior Pain description: ache Aggravating factors: activity an  sleep positions Relieving factors: rest  PRECAUTIONS: None  RED FLAGS: None   WEIGHT BEARING RESTRICTIONS: No  FALLS:  Has patient fallen in last 6 months? No  OCCUPATION: House cleaner  PLOF: Independent  PATIENT GOALS:To manage my elbow issues  NEXT MD VISIT:   OBJECTIVE:  Note: Objective measures were completed at Evaluation unless otherwise noted.  DIAGNOSTIC FINDINGS:  None recent  PATIENT SURVEYS:  Patient-specific activity scoring scheme (Point to one number):  0 represents unable to perform. 10 represents able to perform at prior level. 0 1 2 3 4 5 6 7 8 9  10 (Date and Score) Activity Initial  Activity Eval     Sleeping through the night  5    Straightening my elbow  5    Lifting 5 # 5     Total score = sum of the activity scores/number of activities Minimum detectable change (90%CI) for average score = 2 points Minimum detectable change (90%CI) for single activity score = 3 points PSFS developed by: [name]., & Binkley, J. (1995). Assessing disability and change on individual  patients: a report of a patient specific measure. [employer] [place], 47, 741-736. Reproduced with the permission of the authors  Score: 15/30 50%    POSTURE: WFL  UPPER EXTREMITY ROM:   A/PROM Right eval Left eval  Shoulder flexion    Shoulder extension    Shoulder abduction    Shoulder adduction    Shoulder internal rotation    Shoulder external rotation    Elbow flexion  WNL  Elbow extension  -5d/WNL  Wrist flexion  WNL  Wrist extension  WNL  Wrist ulnar deviation  WNL  Wrist radial deviation  WNL  Wrist pronation  WNL  Wrist supination  WNL  (Blank rows = not tested)  UPPER EXTREMITY MMT:  MMT Right eval Left eval  Shoulder flexion    Shoulder extension    Shoulder abduction    Shoulder adduction    Shoulder internal rotation    Shoulder external rotation    Middle trapezius    Lower trapezius    Elbow  flexion  4-  Elbow extension  3  Wrist flexion  4  Wrist extension  4  Wrist ulnar deviation    Wrist radial deviation    Wrist pronation  4  Wrist supination  4  Grip strength (lbs) 42# 26#  Pinch key 9# 8#  (Blank rows = not tested)  SHOULDER SPECIAL TESTS: deferred  JOINT  MOBILITY TESTING:  deferred  PALPATION:  unremarkable                                                                                                                             TREATMENT: [facility] Adult PT Treatment:                                                DATE: 11/08/24 Eval and HEP Self Care: Additional minutes spent for educating on updated Therapeutic Home Exercise Program as well as comparing current status to condition at start of symptoms. This included exercises focusing on stretching, strengthening, with focus on eccentric aspects. Long term goals include an improvement in range of motion, strength, endurance as well as avoiding reinjury. Patient's frequency would include in 1-2 times a day, 3-5 times a week for a duration of 6-12 weeks. Proper technique shown and discussed handout in great detail. All questions were discussed and addressed.     PATIENT EDUCATION: Education details: Discussed eval findings, rehab rationale and POC and patient is in agreement  Person educated: Patient Education method: Explanation and Handouts Education comprehension: verbalized understanding and needs further education  HOME EXERCISE PROGRAM: Access Code: [email or web address] URL: [email or web address] Date: 11/08/2024 Prepared by: Reyes Kohut  Exercises - Forearm Pronation with Dumbbell  - 3 x daily - 5 x weekly - 1 sets - 15 reps - Seated Elbow Flexion with Resistance  - 3 x daily - 5 x weekly - 1 sets - 15 reps - Standing Elbow Extension with Self-Anchored Resistance  - 3 x daily - 5 x weekly - 1 sets - 15 reps  ASSESSMENT:  CLINICAL IMPRESSION: Patient is a 64 y.o. femal who was seen today for  physical therapy evaluation and treatment for L elbow weakness, loss of mobility and discomfort following triceps repair on 08/14/24.  Patient reports little to no resting pain.  PROM is WNL, AROM limited to -5d due to mild soft tissue edema.  Key grip strength is good but L grip strength diminished.  L wrist ROM/mobility is WNL.  Patient is a good rehab canadidate to focus on regaining L elbow strength and function.  OBJECTIVE IMPAIRMENTS: decreased activity tolerance, decreased knowledge of condition, decreased mobility, decreased ROM, decreased strength, impaired UE functional use, and pain.   ACTIVITY LIMITATIONS: carrying, lifting, sleeping, dressing, self feeding, and reach over head  PARTICIPATION LIMITATIONS: meal prep and cleaning  PERSONAL FACTORS: Age and Time since onset of injury/illness/exacerbation are also affecting patient's functional outcome.   REHAB POTENTIAL: Good  CLINICAL DECISION MAKING: Stable/uncomplicated  EVALUATION COMPLEXITY: Moderate   GOALS: Goals reviewed with patient? No  SHORT TERM GOALS: Target date: 12/06/2024    Patient to demonstrate independence in HEP  Baseline: [id number] Goal status: INITIAL  2.  0d AROM L elbow extension Baseline: -5d Goal status:  INITIAL  LONG TERM GOALS: Target date: 01/03/2025    Patient will acknowledge 2/10 pain at least once during episode of care   Baseline: 5/10 Goal status: INITIAL  2.  Patient will score at least 20/30 on PSFS to signify clinically meaningful improvement in functional abilities.   Baseline: 15/30 Goal status: INITIAL  3.  Increase L elbow/wrist strength to 4+/5 Baseline:   R L  Elbow flexion  4-  Elbow extension  3  Wrist flexion  4  Wrist extension  4  Wrist ulnar deviation    Wrist radial deviation    Wrist pronation  4  Wrist supination  4   Goal status: INITIAL    PLAN:  PT FREQUENCY: 2x/week  PT DURATION: 6 weeks  PLANNED INTERVENTIONS: 97110-Therapeutic exercises,  97530- Therapeutic activity, 97112- Neuromuscular re-education, 97535- Self Care, [id number]- Manual therapy, and Patient/Family education  PLAN FOR NEXT SESSION: HEP review and update, manual techniques as appropriate, aerobic tasks, ROM and flexibility activities, strengthening and PREs, TPDN, gait and balance training,aquatic therapy, modalities for pain and NMRE     Date of referral: 10/15/2024 Referring provider: Beverley Evalene BIRCH, MD Referring diagnosis? L triceps repair Treatment diagnosis? (if different than referring diagnosis) L triceps repair  What was this (referring dx) caused by? Felton Hawks of Condition: Initial Onset (within last 3 months)   Laterality: Lt  Current Functional Measure Score: Patient Specific Functional Scale 15/30  Objective measurements identify impairments when they are compared to normal values, the uninvolved extremity, and prior level of function.  [x]  Yes  []  No  Objective assessment of functional ability: Minimal functional limitations   Briefly describe symptoms: stiffness and weakness in L elbow  How did symptoms start: fall  Average pain intensity:  Last 24 hours: 5  Past week: 0-5  How often does the pt experience symptoms? Occasionally  How much have the symptoms interfered with usual daily activities? Moderately  How has condition changed since care began at this facility? NA - initial visit  In general, how is the patients overall health? Good   BACK PAIN (STarT Back Screening Tool) No   Reyes CHRISTELLA Kohut, PT 11/21/2024, 1:17 PM  "

## 2024-11-23 ENCOUNTER — Ambulatory Visit

## 2024-11-27 ENCOUNTER — Ambulatory Visit

## 2024-11-28 NOTE — Therapy (Unsigned)
 " OUTPATIENT PHYSICAL THERAPY TREATMENT NOTE   Patient Name: [name] MRN: [id number] DOB:Aug 02, 1961, 64 y.o., female Today's Date: 11/29/2024  END OF SESSION:  PT End of Session - 11/29/24 1532     Visit Number 2    Number of Visits 12    Date for Recertification  01/06/25    Authorization Type [employer] MCR    PT Start Time 1530    PT Stop Time 1610    PT Time Calculation (min) 40 min    Activity Tolerance Patient tolerated treatment well    Behavior During Therapy WFL for tasks assessed/performed           Past Medical History:  Diagnosis Date   Arthritis    Bowel obstruction (HCC)    Bronchitis    CHF (congestive heart failure) (HCC)    Chronic systolic dysfunction of left ventricle    Diabetes mellitus without complication (HCC)    on oral meds   GERD (gastroesophageal reflux disease)    Headache(784.0)    migraines, hasn't had one for over a year   History of noncompliance with medical treatment    Hypercholesteremia    Hypertension    LVH (left ventricular hypertrophy)    Nonischemic cardiomyopathy (HCC)    Polysubstance abuse (HCC)    Tobacco abuse disorder    Past Surgical History:  Procedure Laterality Date   ABDOMINAL HYSTERECTOMY     ABDOMINAL SURGERY     left foot surgery     STRABISMUS SURGERY Left 07/31/2013   Procedure: REPAIR STRABISMUS;  Surgeon: Elsie MALVA Salt, MD;  Location: [facility] OR;  Service: Ophthalmology;  Laterality: Left;  EYE MUSCLE SURGERY LEFT EYE   ULNAR NERVE TRANSPOSITION Left 08/14/2024   Procedure: ULNAR NERVE DECOMPRESSION WITH TRICEPS TENDON REPAIR AND OLECRANON BURSECTOMY;  Surgeon: Beverley Evalene JONETTA, MD;  Location: [facility];  Service: Orthopedics;  Laterality: Left;   UTERINE FIBROID SURGERY     Patient Active Problem List   Diagnosis Date Noted   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 08/14/2024   CKD (chronic kidney disease) 06/28/2018   Chronic systolic CHF (congestive heart failure) (HCC) 06/28/2018    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 06/28/2018   Nephropathy associated with another disease 06/28/2018   Lumbago 06/28/2018   Vitamin D  deficiency, unspecified 06/28/2018   Routine general medical examination at a health care facility 06/28/2018   Dry eye syndrome 06/28/2018   Imbalance 10/04/2017   Diabetes (HCC) 08/09/2016   Left acoustic neuroma (HCC) 10/03/2015   Essential hypertension 03/29/2012   Chronic systolic dysfunction of left ventricle 11/24/2011   ACS (acute coronary syndrome) (HCC) 11/23/2011   CHF, acute (HCC) 11/23/2011   Cocaine abuse (HCC) 11/23/2011   Tobacco abuse 11/23/2011   Pneumonitis, aspiration (HCC) 11/23/2011   Cardiomyopathy (HCC) 11/23/2011   HTN (hypertension), malignant 11/23/2011    PCP: Benjamine Aland, MD   REFERRING PROVIDER: Beverley Evalene JONETTA, MD  REFERRING DIAG: [phone] (ICD-10-CM) - Pain in right elbow(should be L elbow)  THERAPY DIAG:  Pain in left elbow  Muscle weakness (generalized)  Strain of left triceps, initial encounter  Rationale for Evaluation and Treatment: Rehabilitation  ONSET DATE: 08/14/24 DOS    SUBJECTIVE:  SUBJECTIVE STATEMENT: Arrives to PT with neck and back pain following 2 recent falls on ice.  Despite trauma, no increased elbow pain/symptoms.      Fell and tore L triceps tendon distally, underwent tendon repair on 08/14/24.  Overall pain minimal, main concern is stiffness and weakness.  Denies N&T or paresthesias. Hand dominance: Right  PERTINENT HISTORY: Diagnostic Studies:  No new diagnostic studies were performed during this visit. Previous triceps tendon repair was approximately eight weeks ago on 10/14.   Impression:  64 year old female with left elbow and shoulder pain post-triceps tendon  repair.   Plan:  - Recommend physical therapy for the left elbow and shoulder to improve range of motion and strength.  - Follow-up appointment in six to eight weeks to assess progress.  - Discussed that the bruise from the brace should resolve over time.  - Patient advised to continue using the knee sleeve as needed for support.  - Encouraged to engage in therapy to expedite recovery and improve function.  PAIN:  Are you having pain? Yes: NPRS scale: 0-5/10 Pain location: L elbow, posterior Pain description: ache Aggravating factors: activity an sleep positions Relieving factors: rest  PRECAUTIONS: None  RED FLAGS: None   WEIGHT BEARING RESTRICTIONS: No  FALLS:  Has patient fallen in last 6 months? No  OCCUPATION: House cleaner  PLOF: Independent  PATIENT GOALS:To manage my elbow issues  NEXT MD VISIT:   OBJECTIVE:  Note: Objective measures were completed at Evaluation unless otherwise noted.  DIAGNOSTIC FINDINGS:  None recent  PATIENT SURVEYS:  Patient-specific activity scoring scheme (Point to one number):  0 represents unable to perform. 10 represents able to perform at prior level. 0 1 2 3 4 5 6 7 8 9  10 (Date and Score) Activity Initial  Activity Eval     Sleeping through the night  5    Straightening my elbow  5    Lifting 5 # 5     Total score = sum of the activity scores/number of activities Minimum detectable change (90%CI) for average score = 2 points Minimum detectable change (90%CI) for single activity score = 3 points PSFS developed by: [name]., & Binkley, J. (1995). Assessing disability and change on individual  patients: a report of a patient specific measure. [employer] [place], 47, 741-736. Reproduced with the permission of the authors  Score: 15/30 50%    POSTURE: WFL  UPPER EXTREMITY ROM:   A/PROM Right eval Left eval  Shoulder flexion    Shoulder extension    Shoulder abduction     Shoulder adduction    Shoulder internal rotation    Shoulder external rotation    Elbow flexion  WNL  Elbow extension  -5d/WNL  Wrist flexion  WNL  Wrist extension  WNL  Wrist ulnar deviation  WNL  Wrist radial deviation  WNL  Wrist pronation  WNL  Wrist supination  WNL  (Blank rows = not tested)  UPPER EXTREMITY MMT:  MMT Right eval Left eval  Shoulder flexion    Shoulder extension    Shoulder abduction    Shoulder adduction    Shoulder internal rotation    Shoulder external rotation    Middle trapezius    Lower trapezius    Elbow flexion  4-  Elbow extension  3  Wrist flexion  4  Wrist extension  4  Wrist ulnar deviation    Wrist radial deviation    Wrist pronation  4  Wrist supination  4  Grip strength (lbs) 42# 26#  Pinch key 9# 8#  (Blank rows = not tested)  SHOULDER SPECIAL TESTS: deferred  JOINT MOBILITY TESTING:  deferred  PALPATION:  unremarkable                                                                                                                             TREATMENT: [facility] Adult PT Treatment:                                                DATE: 11/29/24 Therapeutic Exercise: Nustep L2 8 min  Therapeutic Activity: Suitcase carry 5# KB 2 laps Waiter carry 5# 1 lap due to fatigue Seated OH press 500g ball 15x Supine press 500g ball 15x Supine flexion 500g ball 15x B elbow extension sitting YTB 15x B elbow flexion sitting YTB 15x Seated press YTB 15x  [facility] Adult PT Treatment:                                                DATE: 11/08/24 Eval and HEP Self Care: Additional minutes spent for educating on updated Therapeutic Home Exercise Program as well as comparing current status to condition at start of symptoms. This included exercises focusing on stretching, strengthening, with focus on eccentric aspects. Long term goals include an improvement in range of motion, strength, endurance as well as avoiding reinjury. Patient's frequency  would include in 1-2 times a day, 3-5 times a week for a duration of 6-12 weeks. Proper technique shown and discussed handout in great detail. All questions were discussed and addressed.     PATIENT EDUCATION: Education details: Discussed eval findings, rehab rationale and POC and patient is in agreement  Person educated: Patient Education method: Explanation and Handouts Education comprehension: verbalized understanding and needs further education  HOME EXERCISE PROGRAM: Access Code: [email or web address] URL: [email or web address] Date: 11/08/2024 Prepared by: Reyes Kohut  Exercises - Forearm Pronation with Dumbbell  - 3 x daily - 5 x weekly - 1 sets - 15 reps - Seated Elbow Flexion with Resistance  - 3 x daily - 5 x weekly - 1 sets - 15 reps - Standing Elbow Extension with Self-Anchored Resistance  - 3 x daily - 5 x weekly - 1 sets - 15 reps  ASSESSMENT:  CLINICAL IMPRESSION:  First f/u session with treatment consisting of aerobic warm up followed by functional strengthening tasks.  Some L shoulder discomfort with strengthening tasks.     Patient is a 64 y.o. femal who was seen today for physical therapy evaluation and treatment for L elbow weakness, loss of mobility and discomfort following triceps repair on 08/14/24.  Patient reports little to no resting pain.  PROM is WNL, AROM limited to -5d due to mild soft tissue edema.  Key grip strength is good but L grip strength diminished.  L wrist ROM/mobility is WNL.  Patient is a good rehab canadidate to focus on regaining L elbow strength and function.  OBJECTIVE IMPAIRMENTS: decreased activity tolerance, decreased knowledge of condition, decreased mobility, decreased ROM, decreased strength, impaired UE functional use, and pain.   ACTIVITY LIMITATIONS: carrying, lifting, sleeping, dressing, self feeding, and reach over head  PARTICIPATION LIMITATIONS: meal prep and cleaning  PERSONAL FACTORS: Age and Time since onset of  injury/illness/exacerbation are also affecting patient's functional outcome.   REHAB POTENTIAL: Good  CLINICAL DECISION MAKING: Stable/uncomplicated  EVALUATION COMPLEXITY: Moderate   GOALS: Goals reviewed with patient? No  SHORT TERM GOALS: Target date: 12/06/2024    Patient to demonstrate independence in HEP  Baseline: [id number] Goal status: INITIAL  2.  0d AROM L elbow extension Baseline: -5d Goal status: INITIAL  LONG TERM GOALS: Target date: 01/03/2025    Patient will acknowledge 2/10 pain at least once during episode of care   Baseline: 5/10 Goal status: INITIAL  2.  Patient will score at least 20/30 on PSFS to signify clinically meaningful improvement in functional abilities.   Baseline: 15/30 Goal status: INITIAL  3.  Increase L elbow/wrist strength to 4+/5 Baseline:   R L  Elbow flexion  4-  Elbow extension  3  Wrist flexion  4  Wrist extension  4  Wrist ulnar deviation    Wrist radial deviation    Wrist pronation  4  Wrist supination  4   Goal status: INITIAL    PLAN:  PT FREQUENCY: 2x/week  PT DURATION: 6 weeks  PLANNED INTERVENTIONS: 97110-Therapeutic exercises, 97530- Therapeutic activity, 97112- Neuromuscular re-education, 97535- Self Care, [id number]- Manual therapy, and Patient/Family education  PLAN FOR NEXT SESSION: HEP review and update, manual techniques as appropriate, aerobic tasks, ROM and flexibility activities, strengthening and PREs, TPDN, gait and balance training,aquatic therapy, modalities for pain and NMRE     Date of referral: 10/15/2024 Referring provider: Beverley Evalene BIRCH, MD Referring diagnosis? L triceps repair Treatment diagnosis? (if different than referring diagnosis) L triceps repair  What was this (referring dx) caused by? Felton Hawks of Condition: Initial Onset (within last 3 months)   Laterality: Lt  Current Functional Measure Score: Patient Specific Functional Scale 15/30  Objective measurements identify  impairments when they are compared to normal values, the uninvolved extremity, and prior level of function.  [x]  Yes  []  No  Objective assessment of functional ability: Minimal functional limitations   Briefly describe symptoms: stiffness and weakness in L elbow  How did symptoms start: fall  Average pain intensity:  Last 24 hours: 5  Past week: 0-5  How often does the pt experience symptoms? Occasionally  How much have the symptoms interfered with usual daily activities? Moderately  How has condition changed since care began at this facility? NA - initial visit  In general, how is the patients overall health? Good   BACK PAIN (STarT Back Screening Tool) No   Reyes CHRISTELLA Kohut, PT 11/29/2024, 4:12 PM  "

## 2024-11-29 ENCOUNTER — Ambulatory Visit

## 2024-11-29 DIAGNOSIS — S46312A Strain of muscle, fascia and tendon of triceps, left arm, initial encounter: Secondary | ICD-10-CM

## 2024-11-29 DIAGNOSIS — M6281 Muscle weakness (generalized): Secondary | ICD-10-CM

## 2024-11-29 DIAGNOSIS — M25522 Pain in left elbow: Secondary | ICD-10-CM

## 2024-12-06 NOTE — Therapy (Unsigned)
 " OUTPATIENT PHYSICAL THERAPY TREATMENT NOTE   Patient Name: FINDLEY BLANKENBAKER MRN: [id number] DOB:10-26-1961, 64 y.o., female Today's Date: 12/07/2024  END OF SESSION:  PT End of Session - 12/07/24 1002     Visit Number 3    Number of Visits 12    Date for Recertification  01/06/25    Authorization Type [employer] MCR    Authorization Time Period medical necessity    PT Start Time 1000    PT Stop Time 1038    PT Time Calculation (min) 38 min    Activity Tolerance Patient tolerated treatment well    Behavior During Therapy WFL for tasks assessed/performed            Past Medical History:  Diagnosis Date   Arthritis    Bowel obstruction (HCC)    Bronchitis    CHF (congestive heart failure) (HCC)    Chronic systolic dysfunction of left ventricle    Diabetes mellitus without complication (HCC)    on oral meds   GERD (gastroesophageal reflux disease)    Headache(784.0)    migraines, hasn't had one for over a year   History of noncompliance with medical treatment    Hypercholesteremia    Hypertension    LVH (left ventricular hypertrophy)    Nonischemic cardiomyopathy (HCC)    Polysubstance abuse (HCC)    Tobacco abuse disorder    Past Surgical History:  Procedure Laterality Date   ABDOMINAL HYSTERECTOMY     ABDOMINAL SURGERY     left foot surgery     STRABISMUS SURGERY Left 07/31/2013   Procedure: REPAIR STRABISMUS;  Surgeon: Elsie MALVA Salt, MD;  Location: [facility] OR;  Service: Ophthalmology;  Laterality: Left;  EYE MUSCLE SURGERY LEFT EYE   ULNAR NERVE TRANSPOSITION Left 08/14/2024   Procedure: ULNAR NERVE DECOMPRESSION WITH TRICEPS TENDON REPAIR AND OLECRANON BURSECTOMY;  Surgeon: Beverley Evalene JONETTA, MD;  Location: [facility];  Service: Orthopedics;  Laterality: Left;   UTERINE FIBROID SURGERY     Patient Active Problem List   Diagnosis Date Noted   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 08/14/2024   CKD (chronic kidney disease) 06/28/2018   Chronic systolic  CHF (congestive heart failure) (HCC) 06/28/2018   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 06/28/2018   Nephropathy associated with another disease 06/28/2018   Lumbago 06/28/2018   Vitamin D  deficiency, unspecified 06/28/2018   Routine general medical examination at a health care facility 06/28/2018   Dry eye syndrome 06/28/2018   Imbalance 10/04/2017   Diabetes (HCC) 08/09/2016   Left acoustic neuroma (HCC) 10/03/2015   Essential hypertension 03/29/2012   Chronic systolic dysfunction of left ventricle 11/24/2011   ACS (acute coronary syndrome) (HCC) 11/23/2011   CHF, acute (HCC) 11/23/2011   Cocaine abuse (HCC) 11/23/2011   Tobacco abuse 11/23/2011   Pneumonitis, aspiration (HCC) 11/23/2011   Cardiomyopathy (HCC) 11/23/2011   HTN (hypertension), malignant 11/23/2011    PCP: Benjamine Aland, MD   REFERRING PROVIDER: Beverley Evalene JONETTA, MD  REFERRING DIAG: [phone] (ICD-10-CM) - Pain in right elbow(should be L elbow)  THERAPY DIAG:  Pain in left elbow  Muscle weakness (generalized)  Strain of left triceps, initial encounter  Rationale for Evaluation and Treatment: Rehabilitation  ONSET DATE: 08/14/24 DOS    SUBJECTIVE:  SUBJECTIVE STATEMENT: Still reports global soreness from fall.  Main concern is inability to reach [facility] due to shoulder pain/weakness.  Fell and tore L triceps tendon distally, underwent tendon repair on 08/14/24.  Overall pain minimal, main concern is stiffness and weakness.  Denies N&T or paresthesias. Hand dominance: Right  PERTINENT HISTORY: Diagnostic Studies:  No new diagnostic studies were performed during this visit. Previous triceps tendon repair was approximately eight weeks ago on 10/14.   Impression:  64 year old female with left elbow and  shoulder pain post-triceps tendon repair.   Plan:  - Recommend physical therapy for the left elbow and shoulder to improve range of motion and strength.  - Follow-up appointment in six to eight weeks to assess progress.  - Discussed that the bruise from the brace should resolve over time.  - Patient advised to continue using the knee sleeve as needed for support.  - Encouraged to engage in therapy to expedite recovery and improve function.  PAIN:  Are you having pain? Yes: NPRS scale: 0-5/10 Pain location: L elbow, posterior Pain description: ache Aggravating factors: activity an sleep positions Relieving factors: rest  PRECAUTIONS: None  RED FLAGS: None   WEIGHT BEARING RESTRICTIONS: No  FALLS:  Has patient fallen in last 6 months? No  OCCUPATION: House cleaner  PLOF: Independent  PATIENT GOALS:To manage my elbow issues  NEXT MD VISIT:   OBJECTIVE:  Note: Objective measures were completed at Evaluation unless otherwise noted.  DIAGNOSTIC FINDINGS:  None recent  PATIENT SURVEYS:  Patient-specific activity scoring scheme (Point to one number):  0 represents unable to perform. 10 represents able to perform at prior level. 0 1 2 3 4 5 6 7 8 9  10 (Date and Score) Activity Initial  Activity Eval     Sleeping through the night  5    Straightening my elbow  5    Lifting 5 # 5     Total score = sum of the activity scores/number of activities Minimum detectable change (90%CI) for average score = 2 points Minimum detectable change (90%CI) for single activity score = 3 points PSFS developed by: [name]., & Binkley, J. (1995). Assessing disability and change on individual  patients: a report of a patient specific measure. [employer] [place], 47, 741-736. Reproduced with the permission of the authors  Score: 15/30 50%    POSTURE: WFL  UPPER EXTREMITY ROM:   A/PROM Right eval Left eval  Shoulder flexion    Shoulder  extension    Shoulder abduction    Shoulder adduction    Shoulder internal rotation    Shoulder external rotation    Elbow flexion  WNL  Elbow extension  -5d/WNL  Wrist flexion  WNL  Wrist extension  WNL  Wrist ulnar deviation  WNL  Wrist radial deviation  WNL  Wrist pronation  WNL  Wrist supination  WNL  (Blank rows = not tested)  UPPER EXTREMITY MMT:  MMT Right eval Left eval  Shoulder flexion    Shoulder extension    Shoulder abduction    Shoulder adduction    Shoulder internal rotation    Shoulder external rotation    Middle trapezius    Lower trapezius    Elbow flexion  4-  Elbow extension  3  Wrist flexion  4  Wrist extension  4  Wrist ulnar deviation    Wrist radial deviation    Wrist pronation  4  Wrist supination  4  Grip strength (lbs) 42#  26#  Pinch key 9# 8#  (Blank rows = not tested)  SHOULDER SPECIAL TESTS: deferred  JOINT MOBILITY TESTING:  deferred  PALPATION:  unremarkable                                                                                                                             TREATMENT: [facility] Adult PT Treatment:                                                DATE: 12/07/24 Therapeutic Exercise: UBE L1 4/4 min  Therapeutic Activity: Suitcase carry 5# KB 2 laps Waiter carry 1kg ball 2 laps Seated OH press 500g ball 15x Supine press 1kg ball 15x Supine flexion 500g ball 15x(unable due to L shoulder discomfort) B elbow extension sitting RTB 15x B elbow flexion sitting RTB 15x Seated press RTB 15x  [facility] Adult PT Treatment:                                                DATE: 11/29/24 Therapeutic Exercise: Nustep L2 8 min  Therapeutic Activity: Suitcase carry 5# KB 2 laps Waiter carry 5# 1 lap due to fatigue Seated OH press 500g ball 15x Supine press 500g ball 15x Supine flexion 500g ball 15x B elbow extension sitting YTB 15x B elbow flexion sitting YTB 15x Seated press YTB 15x  [facility] Adult PT Treatment:                                                 DATE: 11/08/24 Eval and HEP Self Care: Additional minutes spent for educating on updated Therapeutic Home Exercise Program as well as comparing current status to condition at start of symptoms. This included exercises focusing on stretching, strengthening, with focus on eccentric aspects. Long term goals include an improvement in range of motion, strength, endurance as well as avoiding reinjury. Patient's frequency would include in 1-2 times a day, 3-5 times a week for a duration of 6-12 weeks. Proper technique shown and discussed handout in great detail. All questions were discussed and addressed.     PATIENT EDUCATION: Education details: Discussed eval findings, rehab rationale and POC and patient is in agreement  Person educated: Patient Education method: Explanation and Handouts Education comprehension: verbalized understanding and needs further education  HOME EXERCISE PROGRAM: Access Code: [email or web address] URL: [email or web address] Date: 11/08/2024 Prepared by: Reyes Kohut  Exercises - Forearm Pronation with Dumbbell  - 3 x daily - 5 x weekly - 1 sets - 15 reps - Seated Elbow Flexion with Resistance  -  3 x daily - 5 x weekly - 1 sets - 15 reps - Standing Elbow Extension with Self-Anchored Resistance  - 3 x daily - 5 x weekly - 1 sets - 15 reps  ASSESSMENT:  CLINICAL IMPRESSION:  Continued to address strength deficits and functional limitations in L elbow.  L elbow activity/exercise appeared limited by chronic shoulder pain.  Able to tolerate increased resistance on certain exercises but not others.  Patient will schedule with ortho to address ongoing shoulder issues.   Patient is a 64 y.o. femal who was seen today for physical therapy evaluation and treatment for L elbow weakness, loss of mobility and discomfort following triceps repair on 08/14/24.  Patient reports little to no resting pain.  PROM is WNL, AROM limited to -5d due to mild  soft tissue edema.  Key grip strength is good but L grip strength diminished.  L wrist ROM/mobility is WNL.  Patient is a good rehab canadidate to focus on regaining L elbow strength and function.  OBJECTIVE IMPAIRMENTS: decreased activity tolerance, decreased knowledge of condition, decreased mobility, decreased ROM, decreased strength, impaired UE functional use, and pain.   ACTIVITY LIMITATIONS: carrying, lifting, sleeping, dressing, self feeding, and reach over head  PARTICIPATION LIMITATIONS: meal prep and cleaning  PERSONAL FACTORS: Age and Time since onset of injury/illness/exacerbation are also affecting patient's functional outcome.   REHAB POTENTIAL: Good  CLINICAL DECISION MAKING: Stable/uncomplicated  EVALUATION COMPLEXITY: Moderate   GOALS: Goals reviewed with patient? No  SHORT TERM GOALS: Target date: 12/06/2024    Patient to demonstrate independence in HEP  Baseline: [email or web address] Goal status: Met  2.  0d AROM L elbow extension Baseline: -5d Goal status: INITIAL  LONG TERM GOALS: Target date: 01/03/2025    Patient will acknowledge 2/10 pain at least once during episode of care   Baseline: 5/10 Goal status: INITIAL  2.  Patient will score at least 20/30 on PSFS to signify clinically meaningful improvement in functional abilities.   Baseline: 15/30 Goal status: INITIAL  3.  Increase L elbow/wrist strength to 4+/5 Baseline:   R L  Elbow flexion  4-  Elbow extension  3  Wrist flexion  4  Wrist extension  4  Wrist ulnar deviation    Wrist radial deviation    Wrist pronation  4  Wrist supination  4   Goal status: INITIAL    PLAN:  PT FREQUENCY: 2x/week  PT DURATION: 6 weeks  PLANNED INTERVENTIONS: 97110-Therapeutic exercises, 97530- Therapeutic activity, 97112- Neuromuscular re-education, 97535- Self Care, [id number]- Manual therapy, and Patient/Family education  PLAN FOR NEXT SESSION: HEP review and update, manual techniques as appropriate, aerobic  tasks, ROM and flexibility activities, strengthening and PREs, TPDN, gait and balance training,aquatic therapy, modalities for pain and NMRE     Date of referral: 10/15/2024 Referring provider: Beverley Evalene BIRCH, MD Referring diagnosis? L triceps repair Treatment diagnosis? (if different than referring diagnosis) L triceps repair  What was this (referring dx) caused by? Felton Hawks of Condition: Initial Onset (within last 3 months)   Laterality: Lt  Current Functional Measure Score: Patient Specific Functional Scale 15/30  Objective measurements identify impairments when they are compared to normal values, the uninvolved extremity, and prior level of function.  [x]  Yes  []  No  Objective assessment of functional ability: Minimal functional limitations   Briefly describe symptoms: stiffness and weakness in L elbow  How did symptoms start: fall  Average pain intensity:  Last 24 hours: 5  Past week: 0-5  How often does the pt experience symptoms? Occasionally  How much have the symptoms interfered with usual daily activities? Moderately  How has condition changed since care began at this facility? NA - initial visit  In general, how is the patients overall health? Good   BACK PAIN (STarT Back Screening Tool) No   Daleena Rotter M Santiago Stenzel, PT 12/07/2024, 10:44 AM  "

## 2024-12-07 ENCOUNTER — Ambulatory Visit: Payer: Self-pay

## 2024-12-07 DIAGNOSIS — M6281 Muscle weakness (generalized): Secondary | ICD-10-CM

## 2024-12-07 DIAGNOSIS — M25522 Pain in left elbow: Secondary | ICD-10-CM

## 2024-12-07 DIAGNOSIS — S46312A Strain of muscle, fascia and tendon of triceps, left arm, initial encounter: Secondary | ICD-10-CM

## 2024-12-07 NOTE — Therapy (Unsigned)
 " OUTPATIENT PHYSICAL THERAPY TREATMENT NOTE   Patient Name: [name] MRN: [id number] DOB:June 27, 1961, 64 y.o., female Today's Date: 12/07/2024  END OF SESSION:      Past Medical History:  Diagnosis Date   Arthritis    Bowel obstruction (HCC)    Bronchitis    CHF (congestive heart failure) (HCC)    Chronic systolic dysfunction of left ventricle    Diabetes mellitus without complication (HCC)    on oral meds   GERD (gastroesophageal reflux disease)    Headache(784.0)    migraines, hasn't had one for over a year   History of noncompliance with medical treatment    Hypercholesteremia    Hypertension    LVH (left ventricular hypertrophy)    Nonischemic cardiomyopathy (HCC)    Polysubstance abuse (HCC)    Tobacco abuse disorder    Past Surgical History:  Procedure Laterality Date   ABDOMINAL HYSTERECTOMY     ABDOMINAL SURGERY     left foot surgery     STRABISMUS SURGERY Left 07/31/2013   Procedure: REPAIR STRABISMUS;  Surgeon: Elsie MALVA Salt, MD;  Location: [facility] OR;  Service: Ophthalmology;  Laterality: Left;  EYE MUSCLE SURGERY LEFT EYE   ULNAR NERVE TRANSPOSITION Left 08/14/2024   Procedure: ULNAR NERVE DECOMPRESSION WITH TRICEPS TENDON REPAIR AND OLECRANON BURSECTOMY;  Surgeon: Beverley Evalene JONETTA, MD;  Location: [facility];  Service: Orthopedics;  Laterality: Left;   UTERINE FIBROID SURGERY     Patient Active Problem List   Diagnosis Date Noted   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 08/14/2024   CKD (chronic kidney disease) 06/28/2018   Chronic systolic CHF (congestive heart failure) (HCC) 06/28/2018   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 06/28/2018   Nephropathy associated with another disease 06/28/2018   Lumbago 06/28/2018   Vitamin D  deficiency, unspecified 06/28/2018   Routine general medical examination at a health care facility 06/28/2018   Dry eye syndrome 06/28/2018   Imbalance 10/04/2017   Diabetes (HCC)  08/09/2016   Left acoustic neuroma (HCC) 10/03/2015   Essential hypertension 03/29/2012   Chronic systolic dysfunction of left ventricle 11/24/2011   ACS (acute coronary syndrome) (HCC) 11/23/2011   CHF, acute (HCC) 11/23/2011   Cocaine abuse (HCC) 11/23/2011   Tobacco abuse 11/23/2011   Pneumonitis, aspiration (HCC) 11/23/2011   Cardiomyopathy (HCC) 11/23/2011   HTN (hypertension), malignant 11/23/2011    PCP: Benjamine Aland, MD   REFERRING PROVIDER: Beverley Evalene JONETTA, MD  REFERRING DIAG: [phone] (ICD-10-CM) - Pain in right elbow(should be L elbow)  THERAPY DIAG:  No diagnosis found.  Rationale for Evaluation and Treatment: Rehabilitation  ONSET DATE: 08/14/24 DOS    SUBJECTIVE:  SUBJECTIVE STATEMENT: Still reports global soreness from fall.  Main concern is inability to reach [facility] due to shoulder pain/weakness.  Fell and tore L triceps tendon distally, underwent tendon repair on 08/14/24.  Overall pain minimal, main concern is stiffness and weakness.  Denies N&T or paresthesias. Hand dominance: Right  PERTINENT HISTORY: Diagnostic Studies:  No new diagnostic studies were performed during this visit. Previous triceps tendon repair was approximately eight weeks ago on 10/14.   Impression:  64 year old female with left elbow and shoulder pain post-triceps tendon repair.   Plan:  - Recommend physical therapy for the left elbow and shoulder to improve range of motion and strength.  - Follow-up appointment in six to eight weeks to assess progress.  - Discussed that the bruise from the brace should resolve over time.  - Patient advised to continue using the knee sleeve as needed for support.  - Encouraged to engage in therapy to expedite recovery and improve function.  PAIN:  Are you having pain?  Yes: NPRS scale: 0-5/10 Pain location: L elbow, posterior Pain description: ache Aggravating factors: activity an sleep positions Relieving factors: rest  PRECAUTIONS: None  RED FLAGS: None   WEIGHT BEARING RESTRICTIONS: No  FALLS:  Has patient fallen in last 6 months? No  OCCUPATION: House cleaner  PLOF: Independent  PATIENT GOALS:To manage my elbow issues  NEXT MD VISIT:   OBJECTIVE:  Note: Objective measures were completed at Evaluation unless otherwise noted.  DIAGNOSTIC FINDINGS:  None recent  PATIENT SURVEYS:  Patient-specific activity scoring scheme (Point to one number):  0 represents unable to perform. 10 represents able to perform at prior level. 0 1 2 3 4 5 6 7 8 9  10 (Date and Score) Activity Initial  Activity Eval     Sleeping through the night  5    Straightening my elbow  5    Lifting 5 # 5     Total score = sum of the activity scores/number of activities Minimum detectable change (90%CI) for average score = 2 points Minimum detectable change (90%CI) for single activity score = 3 points PSFS developed by: [name]., & Binkley, J. (1995). Assessing disability and change on individual  patients: a report of a patient specific measure. [employer] [place], 47, 741-736. Reproduced with the permission of the authors  Score: 15/30 50%    POSTURE: WFL  UPPER EXTREMITY ROM:   A/PROM Right eval Left eval  Shoulder flexion    Shoulder extension    Shoulder abduction    Shoulder adduction    Shoulder internal rotation    Shoulder external rotation    Elbow flexion  WNL  Elbow extension  -5d/WNL  Wrist flexion  WNL  Wrist extension  WNL  Wrist ulnar deviation  WNL  Wrist radial deviation  WNL  Wrist pronation  WNL  Wrist supination  WNL  (Blank rows = not tested)  UPPER EXTREMITY MMT:  MMT Right eval Left eval  Shoulder flexion    Shoulder extension    Shoulder abduction    Shoulder adduction     Shoulder internal rotation    Shoulder external rotation    Middle trapezius    Lower trapezius    Elbow flexion  4-  Elbow extension  3  Wrist flexion  4  Wrist extension  4  Wrist ulnar deviation    Wrist radial deviation    Wrist pronation  4  Wrist supination  4  Grip strength (lbs) 42#  26#  Pinch key 9# 8#  (Blank rows = not tested)  SHOULDER SPECIAL TESTS: deferred  JOINT MOBILITY TESTING:  deferred  PALPATION:  unremarkable                                                                                                                             TREATMENT: [facility] Adult PT Treatment:                                                DATE: 12/07/24 Therapeutic Exercise: UBE L1 4/4 min  Therapeutic Activity: Suitcase carry 5# KB 2 laps Waiter carry 1kg ball 2 laps Seated OH press 500g ball 15x Supine press 1kg ball 15x Supine flexion 500g ball 15x(unable due to L shoulder discomfort) B elbow extension sitting RTB 15x B elbow flexion sitting RTB 15x Seated press RTB 15x  [facility] Adult PT Treatment:                                                DATE: 11/29/24 Therapeutic Exercise: Nustep L2 8 min  Therapeutic Activity: Suitcase carry 5# KB 2 laps Waiter carry 5# 1 lap due to fatigue Seated OH press 500g ball 15x Supine press 500g ball 15x Supine flexion 500g ball 15x B elbow extension sitting YTB 15x B elbow flexion sitting YTB 15x Seated press YTB 15x  [facility] Adult PT Treatment:                                                DATE: 11/08/24 Eval and HEP Self Care: Additional minutes spent for educating on updated Therapeutic Home Exercise Program as well as comparing current status to condition at start of symptoms. This included exercises focusing on stretching, strengthening, with focus on eccentric aspects. Long term goals include an improvement in range of motion, strength, endurance as well as avoiding reinjury. Patient's frequency would include in 1-2 times a  day, 3-5 times a week for a duration of 6-12 weeks. Proper technique shown and discussed handout in great detail. All questions were discussed and addressed.     PATIENT EDUCATION: Education details: Discussed eval findings, rehab rationale and POC and patient is in agreement  Person educated: Patient Education method: Explanation and Handouts Education comprehension: verbalized understanding and needs further education  HOME EXERCISE PROGRAM: Access Code: [email or web address] URL: [email or web address] Date: 11/08/2024 Prepared by: Reyes Kohut  Exercises - Forearm Pronation with Dumbbell  - 3 x daily - 5 x weekly - 1 sets - 15 reps - Seated Elbow Flexion with Resistance  -  3 x daily - 5 x weekly - 1 sets - 15 reps - Standing Elbow Extension with Self-Anchored Resistance  - 3 x daily - 5 x weekly - 1 sets - 15 reps  ASSESSMENT:  CLINICAL IMPRESSION:  Continued to address strength deficits and functional limitations in L elbow.  L elbow activity/exercise appeared limited by chronic shoulder pain.  Able to tolerate increased resistance on certain exercises but not others.  Patient will schedule with ortho to address ongoing shoulder issues.   Patient is a 64 y.o. femal who was seen today for physical therapy evaluation and treatment for L elbow weakness, loss of mobility and discomfort following triceps repair on 08/14/24.  Patient reports little to no resting pain.  PROM is WNL, AROM limited to -5d due to mild soft tissue edema.  Key grip strength is good but L grip strength diminished.  L wrist ROM/mobility is WNL.  Patient is a good rehab canadidate to focus on regaining L elbow strength and function.  OBJECTIVE IMPAIRMENTS: decreased activity tolerance, decreased knowledge of condition, decreased mobility, decreased ROM, decreased strength, impaired UE functional use, and pain.   ACTIVITY LIMITATIONS: carrying, lifting, sleeping, dressing, self feeding, and reach over  head  PARTICIPATION LIMITATIONS: meal prep and cleaning  PERSONAL FACTORS: Age and Time since onset of injury/illness/exacerbation are also affecting patient's functional outcome.   REHAB POTENTIAL: Good  CLINICAL DECISION MAKING: Stable/uncomplicated  EVALUATION COMPLEXITY: Moderate   GOALS: Goals reviewed with patient? No  SHORT TERM GOALS: Target date: 12/06/2024    Patient to demonstrate independence in HEP  Baseline: [email or web address] Goal status: Met  2.  0d AROM L elbow extension Baseline: -5d Goal status: INITIAL  LONG TERM GOALS: Target date: 01/03/2025    Patient will acknowledge 2/10 pain at least once during episode of care   Baseline: 5/10 Goal status: INITIAL  2.  Patient will score at least 20/30 on PSFS to signify clinically meaningful improvement in functional abilities.   Baseline: 15/30 Goal status: INITIAL  3.  Increase L elbow/wrist strength to 4+/5 Baseline:   R L  Elbow flexion  4-  Elbow extension  3  Wrist flexion  4  Wrist extension  4  Wrist ulnar deviation    Wrist radial deviation    Wrist pronation  4  Wrist supination  4   Goal status: INITIAL    PLAN:  PT FREQUENCY: 2x/week  PT DURATION: 6 weeks  PLANNED INTERVENTIONS: 97110-Therapeutic exercises, 97530- Therapeutic activity, 97112- Neuromuscular re-education, 97535- Self Care, [id number]- Manual therapy, and Patient/Family education  PLAN FOR NEXT SESSION: HEP review and update, manual techniques as appropriate, aerobic tasks, ROM and flexibility activities, strengthening and PREs, TPDN, gait and balance training,aquatic therapy, modalities for pain and NMRE     Date of referral: 10/15/2024 Referring provider: Beverley Evalene BIRCH, MD Referring diagnosis? L triceps repair Treatment diagnosis? (if different than referring diagnosis) L triceps repair  What was this (referring dx) caused by? Felton Hawks of Condition: Initial Onset (within last 3 months)   Laterality: Lt  Current  Functional Measure Score: Patient Specific Functional Scale 15/30  Objective measurements identify impairments when they are compared to normal values, the uninvolved extremity, and prior level of function.  [x]  Yes  []  No  Objective assessment of functional ability: Minimal functional limitations   Briefly describe symptoms: stiffness and weakness in L elbow  How did symptoms start: fall  Average pain intensity:  Last 24 hours: 5  Past week: 0-5  How often does the pt experience symptoms? Occasionally  How much have the symptoms interfered with usual daily activities? Moderately  How has condition changed since care began at this facility? NA - initial visit  In general, how is the patients overall health? Good   BACK PAIN (STarT Back Screening Tool) No   Adelynn Gipe M Eddy Liszewski, PT 12/07/2024, 11:43 AM  "

## 2024-12-10 ENCOUNTER — Ambulatory Visit
